# Patient Record
Sex: Male | Born: 1950 | Race: White | Hispanic: No | Marital: Married | Smoking: Current every day smoker
Health system: Southern US, Community
[De-identification: ages and names within clinical notes are randomized; demographics above are authoritative.]

## PROBLEM LIST (undated history)

## (undated) DIAGNOSIS — R918 Other nonspecific abnormal finding of lung field: Secondary | ICD-10-CM

## (undated) DIAGNOSIS — Z720 Tobacco use: Secondary | ICD-10-CM

## (undated) DIAGNOSIS — I471 Supraventricular tachycardia, unspecified: Secondary | ICD-10-CM

## (undated) DIAGNOSIS — M51379 Other intervertebral disc degeneration, lumbosacral region without mention of lumbar back pain or lower extremity pain: Secondary | ICD-10-CM

## (undated) DIAGNOSIS — K529 Noninfective gastroenteritis and colitis, unspecified: Secondary | ICD-10-CM

## (undated) DIAGNOSIS — M1990 Unspecified osteoarthritis, unspecified site: Secondary | ICD-10-CM

## (undated) DIAGNOSIS — G4730 Sleep apnea, unspecified: Secondary | ICD-10-CM

## (undated) DIAGNOSIS — D125 Benign neoplasm of sigmoid colon: Secondary | ICD-10-CM

## (undated) DIAGNOSIS — M5126 Other intervertebral disc displacement, lumbar region: Secondary | ICD-10-CM

## (undated) DIAGNOSIS — J449 Chronic obstructive pulmonary disease, unspecified: Secondary | ICD-10-CM

## (undated) DIAGNOSIS — I509 Heart failure, unspecified: Secondary | ICD-10-CM

## (undated) DIAGNOSIS — I2510 Atherosclerotic heart disease of native coronary artery without angina pectoris: Secondary | ICD-10-CM

## (undated) DIAGNOSIS — I7121 Aneurysm of the ascending aorta, without rupture: Secondary | ICD-10-CM

## (undated) DIAGNOSIS — F329 Major depressive disorder, single episode, unspecified: Secondary | ICD-10-CM

## (undated) DIAGNOSIS — F419 Anxiety disorder, unspecified: Secondary | ICD-10-CM

## (undated) DIAGNOSIS — H9193 Unspecified hearing loss, bilateral: Secondary | ICD-10-CM

## (undated) DIAGNOSIS — I712 Thoracic aortic aneurysm, without rupture: Secondary | ICD-10-CM

## (undated) DIAGNOSIS — J189 Pneumonia, unspecified organism: Secondary | ICD-10-CM

## (undated) DIAGNOSIS — T8140XA Infection following a procedure, unspecified, initial encounter: Secondary | ICD-10-CM

## (undated) DIAGNOSIS — R0602 Shortness of breath: Secondary | ICD-10-CM

## (undated) DIAGNOSIS — N400 Enlarged prostate without lower urinary tract symptoms: Secondary | ICD-10-CM

## (undated) DIAGNOSIS — I499 Cardiac arrhythmia, unspecified: Secondary | ICD-10-CM

## (undated) DIAGNOSIS — M5137 Other intervertebral disc degeneration, lumbosacral region: Secondary | ICD-10-CM

## (undated) DIAGNOSIS — T7840XA Allergy, unspecified, initial encounter: Secondary | ICD-10-CM

## (undated) DIAGNOSIS — E785 Hyperlipidemia, unspecified: Secondary | ICD-10-CM

## (undated) DIAGNOSIS — I10 Essential (primary) hypertension: Secondary | ICD-10-CM

## (undated) HISTORY — DX: Hyperlipidemia, unspecified: E78.5

## (undated) HISTORY — PX: COLONOSCOPY: SHX174

## (undated) HISTORY — DX: Tobacco use: Z72.0

## (undated) HISTORY — PX: APPENDECTOMY: SHX54

## (undated) HISTORY — DX: Benign neoplasm of sigmoid colon: D12.5

## (undated) HISTORY — DX: Heart failure, unspecified: I50.9

## (undated) HISTORY — DX: Noninfective gastroenteritis and colitis, unspecified: K52.9

## (undated) HISTORY — DX: Other nonspecific abnormal finding of lung field: R91.8

## (undated) HISTORY — DX: Sleep apnea, unspecified: G47.30

## (undated) HISTORY — DX: Thoracic aortic aneurysm, without rupture: I71.2

## (undated) HISTORY — DX: Aneurysm of the ascending aorta, without rupture: I71.21

## (undated) HISTORY — PX: CHOLECYSTECTOMY: SHX55

## (undated) HISTORY — DX: Atherosclerotic heart disease of native coronary artery without angina pectoris: I25.10

## (undated) HISTORY — DX: Supraventricular tachycardia, unspecified: I47.10

## (undated) HISTORY — DX: Benign prostatic hyperplasia without lower urinary tract symptoms: N40.0

## (undated) HISTORY — DX: Anxiety disorder, unspecified: F41.9

## (undated) HISTORY — DX: Pneumonia, unspecified organism: J18.9

## (undated) HISTORY — DX: Essential (primary) hypertension: I10

## (undated) HISTORY — DX: Allergy, unspecified, initial encounter: T78.40XA

## (undated) HISTORY — PX: KNEE ARTHROSCOPY W/ MENISCAL REPAIR: SHX1877

## (undated) HISTORY — DX: Major depressive disorder, single episode, unspecified: F32.9

## (undated) HISTORY — DX: Supraventricular tachycardia: I47.1

## (undated) HISTORY — DX: Chronic obstructive pulmonary disease, unspecified: J44.9

## (undated) HISTORY — PX: CARPAL TUNNEL RELEASE: SHX101

---

## 1978-08-23 DIAGNOSIS — I10 Essential (primary) hypertension: Secondary | ICD-10-CM

## 1978-08-23 HISTORY — DX: Essential (primary) hypertension: I10

## 1982-08-23 HISTORY — PX: GASTRIC FUNDOPLICATION: SHX226

## 1997-08-23 DIAGNOSIS — E785 Hyperlipidemia, unspecified: Secondary | ICD-10-CM

## 1997-08-23 HISTORY — DX: Hyperlipidemia, unspecified: E78.5

## 1999-08-24 DIAGNOSIS — F329 Major depressive disorder, single episode, unspecified: Secondary | ICD-10-CM

## 1999-08-24 DIAGNOSIS — F411 Generalized anxiety disorder: Secondary | ICD-10-CM | POA: Insufficient documentation

## 1999-08-24 DIAGNOSIS — F3289 Other specified depressive episodes: Secondary | ICD-10-CM | POA: Insufficient documentation

## 1999-08-24 DIAGNOSIS — F32A Depression, unspecified: Secondary | ICD-10-CM

## 1999-08-24 DIAGNOSIS — F419 Anxiety disorder, unspecified: Secondary | ICD-10-CM

## 1999-08-24 HISTORY — DX: Depression, unspecified: F32.A

## 1999-08-24 HISTORY — DX: Anxiety disorder, unspecified: F41.9

## 2000-08-23 DIAGNOSIS — T7840XA Allergy, unspecified, initial encounter: Secondary | ICD-10-CM

## 2000-08-23 HISTORY — DX: Allergy, unspecified, initial encounter: T78.40XA

## 2001-08-04 ENCOUNTER — Ambulatory Visit (HOSPITAL_COMMUNITY): Admission: RE | Admit: 2001-08-04 | Discharge: 2001-08-04 | Payer: Self-pay | Admitting: Family Medicine

## 2001-08-04 ENCOUNTER — Encounter: Payer: Self-pay | Admitting: Family Medicine

## 2002-11-23 ENCOUNTER — Emergency Department (HOSPITAL_COMMUNITY): Admission: EM | Admit: 2002-11-23 | Discharge: 2002-11-23 | Payer: Self-pay | Admitting: Emergency Medicine

## 2003-01-14 ENCOUNTER — Ambulatory Visit (HOSPITAL_COMMUNITY): Admission: RE | Admit: 2003-01-14 | Discharge: 2003-01-14 | Payer: Self-pay | Admitting: Cardiology

## 2003-01-14 HISTORY — PX: CARDIAC CATHETERIZATION: SHX172

## 2003-02-21 HISTORY — PX: ANKLE FRACTURE SURGERY: SHX122

## 2003-08-24 ENCOUNTER — Encounter: Payer: Self-pay | Admitting: Family Medicine

## 2003-08-24 DIAGNOSIS — E119 Type 2 diabetes mellitus without complications: Secondary | ICD-10-CM | POA: Insufficient documentation

## 2004-01-22 ENCOUNTER — Encounter: Payer: Self-pay | Admitting: Family Medicine

## 2004-01-22 LAB — CONVERTED CEMR LAB: Hgb A1c MFr Bld: 5.2 %

## 2004-03-11 ENCOUNTER — Encounter: Payer: Self-pay | Admitting: Pulmonary Disease

## 2004-03-15 ENCOUNTER — Ambulatory Visit (HOSPITAL_BASED_OUTPATIENT_CLINIC_OR_DEPARTMENT_OTHER): Admission: RE | Admit: 2004-03-15 | Discharge: 2004-03-15 | Payer: Self-pay | Admitting: Pulmonary Disease

## 2004-03-23 HISTORY — PX: OTHER SURGICAL HISTORY: SHX169

## 2004-04-29 ENCOUNTER — Emergency Department (HOSPITAL_COMMUNITY): Admission: EM | Admit: 2004-04-29 | Discharge: 2004-04-29 | Payer: Self-pay | Admitting: Emergency Medicine

## 2004-05-03 ENCOUNTER — Ambulatory Visit (HOSPITAL_BASED_OUTPATIENT_CLINIC_OR_DEPARTMENT_OTHER): Admission: RE | Admit: 2004-05-03 | Discharge: 2004-05-03 | Payer: Self-pay | Admitting: Pulmonary Disease

## 2004-05-03 ENCOUNTER — Encounter: Payer: Self-pay | Admitting: Pulmonary Disease

## 2004-05-23 ENCOUNTER — Encounter: Payer: Self-pay | Admitting: Family Medicine

## 2004-05-23 LAB — CONVERTED CEMR LAB: Hgb A1c MFr Bld: 5.8 %

## 2004-06-03 ENCOUNTER — Ambulatory Visit: Admission: RE | Admit: 2004-06-03 | Discharge: 2004-06-03 | Payer: Self-pay | Admitting: Family Medicine

## 2004-06-09 ENCOUNTER — Ambulatory Visit (HOSPITAL_COMMUNITY): Admission: RE | Admit: 2004-06-09 | Discharge: 2004-06-09 | Payer: Self-pay | Admitting: Family Medicine

## 2004-07-24 ENCOUNTER — Ambulatory Visit: Payer: Self-pay | Admitting: Family Medicine

## 2004-08-11 ENCOUNTER — Ambulatory Visit: Payer: Self-pay

## 2004-08-13 ENCOUNTER — Ambulatory Visit: Payer: Self-pay

## 2004-08-19 ENCOUNTER — Ambulatory Visit: Payer: Self-pay | Admitting: Internal Medicine

## 2004-08-23 DIAGNOSIS — T8140XA Infection following a procedure, unspecified, initial encounter: Secondary | ICD-10-CM

## 2004-08-23 HISTORY — DX: Infection following a procedure, unspecified, initial encounter: T81.40XA

## 2004-10-01 ENCOUNTER — Ambulatory Visit: Payer: Self-pay | Admitting: Family Medicine

## 2004-10-18 ENCOUNTER — Emergency Department (HOSPITAL_COMMUNITY): Admission: EM | Admit: 2004-10-18 | Discharge: 2004-10-19 | Payer: Self-pay | Admitting: Emergency Medicine

## 2004-10-18 DIAGNOSIS — K529 Noninfective gastroenteritis and colitis, unspecified: Secondary | ICD-10-CM

## 2004-10-18 HISTORY — DX: Noninfective gastroenteritis and colitis, unspecified: K52.9

## 2004-10-25 ENCOUNTER — Emergency Department (HOSPITAL_COMMUNITY): Admission: EM | Admit: 2004-10-25 | Discharge: 2004-10-25 | Payer: Self-pay | Admitting: Emergency Medicine

## 2004-10-28 ENCOUNTER — Ambulatory Visit: Payer: Self-pay | Admitting: Family Medicine

## 2004-11-24 HISTORY — PX: GASTRIC BYPASS: SHX52

## 2005-01-22 ENCOUNTER — Ambulatory Visit: Payer: Self-pay | Admitting: Family Medicine

## 2005-01-27 ENCOUNTER — Ambulatory Visit: Payer: Self-pay | Admitting: Pulmonary Disease

## 2005-03-10 ENCOUNTER — Ambulatory Visit: Payer: Self-pay | Admitting: Family Medicine

## 2005-03-12 ENCOUNTER — Ambulatory Visit: Payer: Self-pay | Admitting: Family Medicine

## 2005-03-14 ENCOUNTER — Emergency Department: Payer: Self-pay | Admitting: Emergency Medicine

## 2005-03-15 ENCOUNTER — Ambulatory Visit: Payer: Self-pay | Admitting: Family Medicine

## 2005-03-19 ENCOUNTER — Ambulatory Visit: Payer: Self-pay | Admitting: Family Medicine

## 2005-03-23 ENCOUNTER — Encounter: Payer: Self-pay | Admitting: Family Medicine

## 2005-03-24 ENCOUNTER — Ambulatory Visit: Payer: Self-pay | Admitting: Family Medicine

## 2005-04-19 ENCOUNTER — Ambulatory Visit: Payer: Self-pay | Admitting: Cardiology

## 2005-04-22 ENCOUNTER — Ambulatory Visit: Payer: Self-pay | Admitting: Family Medicine

## 2005-04-23 ENCOUNTER — Ambulatory Visit: Payer: Self-pay | Admitting: Family Medicine

## 2005-08-26 ENCOUNTER — Ambulatory Visit: Payer: Self-pay | Admitting: Family Medicine

## 2005-09-23 ENCOUNTER — Encounter: Payer: Self-pay | Admitting: Family Medicine

## 2005-09-23 LAB — CONVERTED CEMR LAB
Hgb A1c MFr Bld: 5 %
Microalbumin U total vol: 30 mg/L
PSA: 0.44 ng/mL

## 2005-10-06 ENCOUNTER — Ambulatory Visit: Payer: Self-pay | Admitting: Family Medicine

## 2005-10-21 ENCOUNTER — Ambulatory Visit: Payer: Self-pay | Admitting: Family Medicine

## 2005-10-25 ENCOUNTER — Ambulatory Visit: Payer: Self-pay | Admitting: Family Medicine

## 2006-01-02 HISTORY — PX: HERNIA REPAIR: SHX51

## 2006-01-24 ENCOUNTER — Ambulatory Visit: Payer: Self-pay | Admitting: Family Medicine

## 2006-03-15 ENCOUNTER — Encounter: Payer: Self-pay | Admitting: Pulmonary Disease

## 2006-04-22 ENCOUNTER — Ambulatory Visit: Payer: Self-pay | Admitting: Family Medicine

## 2006-04-26 ENCOUNTER — Ambulatory Visit: Payer: Self-pay | Admitting: Family Medicine

## 2006-05-10 ENCOUNTER — Ambulatory Visit: Payer: Self-pay | Admitting: Family Medicine

## 2006-05-17 ENCOUNTER — Ambulatory Visit: Payer: Self-pay | Admitting: Family Medicine

## 2006-05-24 ENCOUNTER — Ambulatory Visit: Payer: Self-pay | Admitting: Family Medicine

## 2006-05-31 ENCOUNTER — Ambulatory Visit: Payer: Self-pay | Admitting: Family Medicine

## 2006-06-07 ENCOUNTER — Ambulatory Visit: Payer: Self-pay | Admitting: Family Medicine

## 2006-06-20 ENCOUNTER — Ambulatory Visit: Payer: Self-pay | Admitting: Pulmonary Disease

## 2006-06-23 ENCOUNTER — Ambulatory Visit: Payer: Self-pay | Admitting: Family Medicine

## 2006-07-25 ENCOUNTER — Ambulatory Visit: Payer: Self-pay | Admitting: Family Medicine

## 2006-09-22 ENCOUNTER — Ambulatory Visit: Payer: Self-pay | Admitting: Pulmonary Disease

## 2006-10-22 ENCOUNTER — Encounter: Payer: Self-pay | Admitting: Family Medicine

## 2006-10-27 ENCOUNTER — Ambulatory Visit: Payer: Self-pay | Admitting: Family Medicine

## 2006-10-27 LAB — CONVERTED CEMR LAB
Alkaline Phosphatase: 135 units/L — ABNORMAL HIGH (ref 39–117)
BUN: 9 mg/dL (ref 6–23)
Basophils Relative: 0.1 % (ref 0.0–1.0)
Bilirubin, Direct: 0.3 mg/dL (ref 0.0–0.3)
CO2: 32 meq/L (ref 19–32)
Cholesterol: 154 mg/dL (ref 0–200)
GFR calc Af Amer: 101 mL/min
Glucose, Bld: 103 mg/dL — ABNORMAL HIGH (ref 70–99)
HDL: 38.6 mg/dL — ABNORMAL LOW (ref >39.0)
Hemoglobin: 16.5 g/dL (ref 13.0–17.0)
Lymphocytes Relative: 21.5 % (ref 12.0–46.0)
MCHC: 34.4 g/dL (ref 30.0–36.0)
Microalb Creat Ratio: 2.9 mg/g (ref 0.0–30.0)
Microalb, Ur: 0.5 mg/dL (ref 0.0–1.9)
Monocytes Absolute: 0.9 10*3/uL — ABNORMAL HIGH (ref 0.2–0.7)
Monocytes Relative: 7.1 % (ref 3.0–11.0)
Neutro Abs: 8.8 10*3/uL — ABNORMAL HIGH (ref 1.4–7.7)
Potassium: 4.2 meq/L (ref 3.5–5.1)
Total Protein: 6.1 g/dL (ref 6.0–8.3)
VLDL: 30 mg/dL (ref 0–40)

## 2006-11-09 ENCOUNTER — Ambulatory Visit: Payer: Self-pay | Admitting: Family Medicine

## 2006-12-09 ENCOUNTER — Encounter: Payer: Self-pay | Admitting: Family Medicine

## 2006-12-09 DIAGNOSIS — I10 Essential (primary) hypertension: Secondary | ICD-10-CM

## 2006-12-09 DIAGNOSIS — I471 Supraventricular tachycardia: Secondary | ICD-10-CM

## 2006-12-09 DIAGNOSIS — J309 Allergic rhinitis, unspecified: Secondary | ICD-10-CM | POA: Insufficient documentation

## 2006-12-09 DIAGNOSIS — G4730 Sleep apnea, unspecified: Secondary | ICD-10-CM | POA: Insufficient documentation

## 2006-12-09 DIAGNOSIS — G4700 Insomnia, unspecified: Secondary | ICD-10-CM

## 2006-12-09 DIAGNOSIS — F17200 Nicotine dependence, unspecified, uncomplicated: Secondary | ICD-10-CM | POA: Insufficient documentation

## 2006-12-28 ENCOUNTER — Ambulatory Visit: Payer: Self-pay | Admitting: Pulmonary Disease

## 2007-04-03 ENCOUNTER — Ambulatory Visit: Payer: Self-pay | Admitting: Pulmonary Disease

## 2007-05-10 ENCOUNTER — Encounter (INDEPENDENT_AMBULATORY_CARE_PROVIDER_SITE_OTHER): Payer: Self-pay | Admitting: *Deleted

## 2007-05-12 ENCOUNTER — Ambulatory Visit: Payer: Self-pay | Admitting: Family Medicine

## 2007-05-31 LAB — CONVERTED CEMR LAB
CO2: 29 meq/L (ref 19–32)
GFR calc Af Amer: 90 mL/min
Glucose, Bld: 100 mg/dL — ABNORMAL HIGH (ref 70–99)
Potassium: 3.7 meq/L (ref 3.5–5.1)

## 2007-06-07 ENCOUNTER — Ambulatory Visit: Payer: Self-pay | Admitting: Family Medicine

## 2007-06-07 DIAGNOSIS — N3941 Urge incontinence: Secondary | ICD-10-CM | POA: Insufficient documentation

## 2007-06-09 ENCOUNTER — Telehealth: Payer: Self-pay | Admitting: Family Medicine

## 2007-06-12 ENCOUNTER — Ambulatory Visit: Payer: Self-pay | Admitting: Family Medicine

## 2007-06-27 ENCOUNTER — Telehealth: Payer: Self-pay | Admitting: Family Medicine

## 2007-06-29 ENCOUNTER — Ambulatory Visit: Payer: Self-pay | Admitting: Family Medicine

## 2007-07-03 ENCOUNTER — Ambulatory Visit: Payer: Self-pay | Admitting: Unknown Physician Specialty

## 2007-07-03 ENCOUNTER — Encounter: Payer: Self-pay | Admitting: Family Medicine

## 2007-07-03 DIAGNOSIS — K635 Polyp of colon: Secondary | ICD-10-CM

## 2007-07-03 HISTORY — DX: Polyp of colon: K63.5

## 2007-07-03 LAB — HM COLONOSCOPY

## 2007-07-18 ENCOUNTER — Ambulatory Visit: Payer: Self-pay | Admitting: Family Medicine

## 2007-08-28 ENCOUNTER — Telehealth: Payer: Self-pay | Admitting: Family Medicine

## 2007-11-10 ENCOUNTER — Ambulatory Visit: Payer: Self-pay | Admitting: Family Medicine

## 2007-11-30 ENCOUNTER — Telehealth: Payer: Self-pay | Admitting: Family Medicine

## 2007-12-22 ENCOUNTER — Telehealth: Payer: Self-pay | Admitting: Family Medicine

## 2008-02-19 ENCOUNTER — Telehealth: Payer: Self-pay | Admitting: Family Medicine

## 2008-02-22 ENCOUNTER — Telehealth: Payer: Self-pay | Admitting: Family Medicine

## 2008-03-05 ENCOUNTER — Ambulatory Visit: Payer: Self-pay | Admitting: Pulmonary Disease

## 2008-03-05 DIAGNOSIS — G2581 Restless legs syndrome: Secondary | ICD-10-CM | POA: Insufficient documentation

## 2008-04-01 ENCOUNTER — Telehealth: Payer: Self-pay | Admitting: Family Medicine

## 2008-04-07 ENCOUNTER — Encounter: Payer: Self-pay | Admitting: Pulmonary Disease

## 2008-04-30 ENCOUNTER — Ambulatory Visit: Payer: Self-pay | Admitting: Family Medicine

## 2008-04-30 LAB — CONVERTED CEMR LAB
Bilirubin Urine: NEGATIVE
Nitrite: NEGATIVE
Specific Gravity, Urine: 1.02

## 2008-05-01 ENCOUNTER — Encounter: Payer: Self-pay | Admitting: Family Medicine

## 2008-05-07 ENCOUNTER — Ambulatory Visit: Payer: Self-pay | Admitting: Family Medicine

## 2008-05-07 DIAGNOSIS — N400 Enlarged prostate without lower urinary tract symptoms: Secondary | ICD-10-CM | POA: Insufficient documentation

## 2008-05-07 DIAGNOSIS — H9190 Unspecified hearing loss, unspecified ear: Secondary | ICD-10-CM | POA: Insufficient documentation

## 2008-05-15 ENCOUNTER — Telehealth: Payer: Self-pay | Admitting: Family Medicine

## 2008-06-10 ENCOUNTER — Ambulatory Visit: Payer: Self-pay | Admitting: Family Medicine

## 2008-07-03 ENCOUNTER — Telehealth: Payer: Self-pay | Admitting: Family Medicine

## 2008-07-22 ENCOUNTER — Telehealth: Payer: Self-pay | Admitting: Family Medicine

## 2008-07-24 ENCOUNTER — Ambulatory Visit: Payer: Self-pay | Admitting: Family Medicine

## 2008-07-24 DIAGNOSIS — L723 Sebaceous cyst: Secondary | ICD-10-CM | POA: Insufficient documentation

## 2008-07-29 ENCOUNTER — Ambulatory Visit: Payer: Self-pay | Admitting: Family Medicine

## 2008-08-08 ENCOUNTER — Ambulatory Visit: Payer: Self-pay | Admitting: Family Medicine

## 2008-08-08 DIAGNOSIS — K648 Other hemorrhoids: Secondary | ICD-10-CM | POA: Insufficient documentation

## 2008-08-12 ENCOUNTER — Telehealth: Payer: Self-pay | Admitting: Family Medicine

## 2008-08-23 HISTORY — PX: BACK SURGERY: SHX140

## 2008-12-05 ENCOUNTER — Telehealth: Payer: Self-pay | Admitting: Family Medicine

## 2009-01-02 ENCOUNTER — Telehealth: Payer: Self-pay | Admitting: Family Medicine

## 2009-03-25 ENCOUNTER — Ambulatory Visit: Payer: Self-pay | Admitting: Family Medicine

## 2009-03-25 LAB — CONVERTED CEMR LAB
ALT: 60 units/L — ABNORMAL HIGH (ref 0–53)
AST: 44 units/L — ABNORMAL HIGH (ref 0–37)
Alkaline Phosphatase: 116 units/L (ref 39–117)
BUN: 15 mg/dL (ref 6–23)
Basophils Absolute: 0.1 10*3/uL (ref 0.0–0.1)
Bilirubin, Direct: 0.2 mg/dL (ref 0.0–0.3)
Calcium: 8.5 mg/dL (ref 8.4–10.5)
Cholesterol: 128 mg/dL (ref 0–200)
Creatinine,U: 228.8 mg/dL
Eosinophils Relative: 2.8 % (ref 0.0–5.0)
GFR calc non Af Amer: 82.16 mL/min (ref >60)
HCT: 44.1 % (ref 39.0–52.0)
HDL: 35.1 mg/dL — ABNORMAL LOW (ref >39.00)
LDL Cholesterol: 75 mg/dL (ref 0–99)
Lymphocytes Relative: 34.4 % (ref 12.0–46.0)
Lymphs Abs: 3.1 10*3/uL (ref 0.7–4.0)
Microalb Creat Ratio: 2.6 mg/g (ref 0.0–30.0)
Monocytes Relative: 7.3 % (ref 3.0–12.0)
Neutrophils Relative %: 54.7 % (ref 43.0–77.0)
PSA: 0.22 ng/mL (ref 0.10–4.00)
Platelets: 190 10*3/uL (ref 150.0–400.0)
Potassium: 3.9 meq/L (ref 3.5–5.1)
Sodium: 143 meq/L (ref 135–145)
Total Bilirubin: 1.9 mg/dL — ABNORMAL HIGH (ref 0.3–1.2)
VLDL: 18.2 mg/dL (ref 0.0–40.0)
WBC: 8.9 10*3/uL (ref 4.5–10.5)

## 2009-03-31 ENCOUNTER — Ambulatory Visit: Payer: Self-pay | Admitting: Family Medicine

## 2009-03-31 DIAGNOSIS — M545 Low back pain: Secondary | ICD-10-CM

## 2009-05-15 ENCOUNTER — Encounter: Payer: Self-pay | Admitting: Family Medicine

## 2009-06-09 ENCOUNTER — Telehealth: Payer: Self-pay | Admitting: Family Medicine

## 2009-06-24 ENCOUNTER — Telehealth: Payer: Self-pay | Admitting: Family Medicine

## 2009-07-23 ENCOUNTER — Telehealth: Payer: Self-pay | Admitting: Family Medicine

## 2009-07-24 ENCOUNTER — Telehealth: Payer: Self-pay | Admitting: Family Medicine

## 2009-09-03 ENCOUNTER — Telehealth: Payer: Self-pay | Admitting: Family Medicine

## 2009-09-10 ENCOUNTER — Telehealth: Payer: Self-pay | Admitting: Family Medicine

## 2009-09-26 ENCOUNTER — Ambulatory Visit: Payer: Self-pay | Admitting: Pulmonary Disease

## 2009-10-02 ENCOUNTER — Ambulatory Visit: Payer: Self-pay | Admitting: Family Medicine

## 2009-10-02 LAB — CONVERTED CEMR LAB
CO2: 22 meq/L (ref 19–32)
Chloride: 108 meq/L (ref 96–112)
Potassium: 3.6 meq/L (ref 3.5–5.1)
Sodium: 137 meq/L (ref 135–145)

## 2009-10-06 ENCOUNTER — Ambulatory Visit: Payer: Self-pay | Admitting: Family Medicine

## 2009-10-06 ENCOUNTER — Telehealth: Payer: Self-pay | Admitting: Family Medicine

## 2009-11-24 ENCOUNTER — Encounter: Payer: Self-pay | Admitting: Pulmonary Disease

## 2009-11-26 ENCOUNTER — Encounter: Payer: Self-pay | Admitting: Family Medicine

## 2009-12-04 ENCOUNTER — Ambulatory Visit: Payer: Self-pay | Admitting: Orthopedic Surgery

## 2009-12-09 ENCOUNTER — Telehealth: Payer: Self-pay | Admitting: Family Medicine

## 2010-01-10 ENCOUNTER — Emergency Department: Payer: Self-pay | Admitting: Emergency Medicine

## 2010-01-22 ENCOUNTER — Telehealth: Payer: Self-pay | Admitting: Family Medicine

## 2010-02-02 ENCOUNTER — Telehealth: Payer: Self-pay | Admitting: Family Medicine

## 2010-02-03 ENCOUNTER — Ambulatory Visit: Payer: Self-pay | Admitting: Family Medicine

## 2010-02-04 ENCOUNTER — Telehealth: Payer: Self-pay | Admitting: Family Medicine

## 2010-02-04 ENCOUNTER — Ambulatory Visit: Payer: Self-pay | Admitting: Family Medicine

## 2010-02-05 LAB — CONVERTED CEMR LAB
Basophils Absolute: 0 10*3/uL (ref 0.0–0.1)
CO2: 25 meq/L (ref 19–32)
GFR calc non Af Amer: 87.97 mL/min (ref >60)
Glucose, Bld: 97 mg/dL (ref 70–99)
HCT: 43.9 % (ref 39.0–52.0)
Hemoglobin: 14.9 g/dL (ref 13.0–17.0)
Lymphs Abs: 2.4 10*3/uL (ref 0.7–4.0)
Monocytes Relative: 5.5 % (ref 3.0–12.0)
Neutro Abs: 8.7 10*3/uL — ABNORMAL HIGH (ref 1.4–7.7)
Potassium: 4.6 meq/L (ref 3.5–5.1)
RDW: 15.3 % — ABNORMAL HIGH (ref 11.5–14.6)
Sodium: 141 meq/L (ref 135–145)

## 2010-02-10 ENCOUNTER — Ambulatory Visit: Payer: Self-pay | Admitting: Family Medicine

## 2010-03-30 ENCOUNTER — Encounter (INDEPENDENT_AMBULATORY_CARE_PROVIDER_SITE_OTHER): Payer: Self-pay | Admitting: *Deleted

## 2010-04-03 ENCOUNTER — Telehealth: Payer: Self-pay | Admitting: Family Medicine

## 2010-05-08 ENCOUNTER — Ambulatory Visit: Payer: Self-pay | Admitting: Neurosurgery

## 2010-05-29 ENCOUNTER — Encounter: Payer: Self-pay | Admitting: Neurosurgery

## 2010-06-03 ENCOUNTER — Telehealth: Payer: Self-pay | Admitting: Family Medicine

## 2010-06-11 ENCOUNTER — Encounter: Payer: Self-pay | Admitting: Family Medicine

## 2010-06-23 ENCOUNTER — Ambulatory Visit: Payer: Self-pay | Admitting: Family Medicine

## 2010-06-23 ENCOUNTER — Encounter: Payer: Self-pay | Admitting: Neurosurgery

## 2010-07-20 ENCOUNTER — Telehealth: Payer: Self-pay | Admitting: Family Medicine

## 2010-07-22 ENCOUNTER — Telehealth: Payer: Self-pay | Admitting: Family Medicine

## 2010-07-23 ENCOUNTER — Encounter: Payer: Self-pay | Admitting: Neurosurgery

## 2010-08-10 ENCOUNTER — Ambulatory Visit: Payer: Self-pay | Admitting: Family Medicine

## 2010-08-13 ENCOUNTER — Ambulatory Visit: Payer: Self-pay | Admitting: Family Medicine

## 2010-08-13 LAB — HM DIABETES FOOT EXAM

## 2010-09-24 NOTE — Progress Notes (Signed)
Summary: RX Prozac  Phone Note Refill Request Call back at [phone] Message from:  [facility] on September 03, 2009 10:52 AM  Refills Requested: Medication #1:  PROZAC 40 MG  CAPS Take 1 capsule by mouth once a day   Last Refilled: 05/23/2009 Received faxed refill request. Request a 90 day supply.   Method Requested: Electronic Initial call taken by: Sydell Axon LPN,  September 03, 2009 10:53 AM    Prescriptions: PROZAC 40 MG  CAPS (FLUOXETINE HCL) Take 1 capsule by mouth once a day  #90 x 3   Entered and Authorized by:   Hannah Beat MD   Signed by:   Hannah Beat MD on 09/03/2009   Method used:   Electronically to        [facility] [address] (retail)       [address]       [place], [place]  [address]       Ph: [phone]       Fax: [phone]   RxID:   [id number]

## 2010-09-24 NOTE — Progress Notes (Signed)
Summary: refill request for flexeril  Phone Note Refill Request Message from:  Patient  Refills Requested: Medication #1:  CYCLOBENZAPRINE HCL 10 MG TABS one tab by mouth three times a day as needed for muscle spasms. Request from pt, uses [facility] in [place], [id number].  This came in on 6/13 but I must have signed off on it instead of sending it to you.  Initial call taken by: Lowella Petties CMA,  February 04, 2010 8:19 AM    Prescriptions: CYCLOBENZAPRINE HCL 10 MG TABS (CYCLOBENZAPRINE HCL) one tab by mouth three times a day as needed for muscle spasms.  #50 x 1   Entered and Authorized by:   Shaune Leeks MD   Signed by:   Shaune Leeks MD on 02/04/2010   Method used:   Electronically to        [facility] [address]* (retail)       [address]       [place], [place]  [address]       Ph: [phone]       Fax: [phone]   RxID:   [id number]

## 2010-09-24 NOTE — Letter (Signed)
Summary: Dr.E.Hines,[facility],Note  Dr.E.Hines,[facility],Note   Imported By: Beau Fanny 11/28/2009 10:18:14  _____________________________________________________________________  External Attachment:    Type:   Image     Comment:   External Document

## 2010-09-24 NOTE — Progress Notes (Signed)
Summary: alprazolam   Phone Note Refill Request Message from:  Fax from Pharmacy on July 22, 2010 1:05 PM  Refills Requested: Medication #1:  ALPRAZOLAM 0.5 MG  TB24 Take 1 tablet by mouth two times a day   Last Refilled: 06/22/2010 Refill request from [facility] pharmacy. [phone].   Initial call taken by: Melody Comas,  July 22, 2010 1:06 PM  Follow-up for Phone Call        Rx called to pharmacy.  Follow-up by: Melody Comas,  July 22, 2010 1:34 PM    Prescriptions: ALPRAZOLAM 0.5 MG  TB24 (ALPRAZOLAM) Take 1 tablet by mouth two times a day  #60 x 5   Entered and Authorized by:   Shaune Leeks MD   Signed by:   Shaune Leeks MD on 07/22/2010   Method used:   Telephoned to ...       [facility] [address] (retail)       [address]       [place], [place]  [address]       Ph: [phone]       Fax: [phone]   RxID:   [id number]

## 2010-09-24 NOTE — Assessment & Plan Note (Signed)
Summary: rov for osa, rls   CC:  Pt is here for a f/u appt.   Pt was last seen July 2009.  Pt states he is wearing his cpap machine every night.  Approx 9 hours per night.  Pt states he got a new mask and needs Dr. Shelle Iron to sign a rx for this for ins to pay.  History of Present Illness: The pt comes in today for f/u of his osa and RLS.  He is maintaining on cpap and also a dopamine agonist for his issues.  He is wearing cpap compliantly, and is having no issues with the mask or pressure.  He feels that he sleeps well, and denies any sleepiness issues during the day.  Medications Prior to Update: 1)  Prozac 40 Mg  Caps (Fluoxetine Hcl) .... Take 1 Capsule By Mouth Once A Day 2)  Simvastatin 20 Mg  Tabs (Simvastatin) .... Take 1 Tablet By Mouth Once A Day 3)  Protonix 40 Mg  Tbec (Pantoprazole Sodium) .... Take 1 Tablet By Mouth Once A Day 4)  Lisinopril 20 Mg  Tabs (Lisinopril) .... Take 1 Tablet By Mouth Once A Day 5)  Multivitamins   Tabs (Multiple Vitamin) .... Take 1 Tablet By Mouth Once A Day 6)  Alprazolam 0.5 Mg  Tb24 (Alprazolam) .... Take 1 Tablet By Mouth Two Times A Day 7)  Ambien 10 Mg  Tabs (Zolpidem Tartrate) .... Take 1 Tablet By Mouth At Bedtime 8)  Calcium  1000mg  .... Daily 9)  Requip 1 Mg  Tabs (Ropinirole Hcl) .... Take 1 Tab By Mouth At Bedtime 10)  Detrol La 4 Mg  Cp24 (Tolterodine Tartrate) .Marland Kitchen.. 1 Tab By Mouth Once Daily 11)  Tussionex Pennkinetic Er 8-10 Mg/71ml  Lqcr (Chlorpheniramine-Hydrocodone) .... One Tsp By Mouth Hs As Needed Cough 12)  Mobic 15 Mg  Tabs (Meloxicam) .Marland Kitchen.. 1 Daily 13)  Klor-Con 10 10 Meq  Tbcr (Potassium Chloride) .Marland Kitchen.. 1 Daily By Mouth 14)  Allegra 180 Mg Tabs (Fexofenadine Hcl) .... One Tab By Mouth Daily 15)  Augmentin 875-125 Mg Tabs (Amoxicillin-Pot Clavulanate) .... One Tab By Mouth Two Times A Day 16)  Cardura 2 Mg Tabs (Doxazosin Mesylate) .... 1/2 Tab By Mouth Once Daily For One Week Then One Tab By Mouth Qd 17)  Proscar 5 Mg Tabs  (Finasteride) .... One Tab By Mouth Once Daily 18)  Anusol-Hc 25 Mg Supp (Hydrocortisone Acetate) .... One Supp Per Rectum Three Times A Day For One Week Then As Needed 19)  Cyclobenzaprine Hcl 10 Mg Tabs (Cyclobenzaprine Hcl) .... One Tab By Mouth Three Times A Day As Needed For Muscle Spasms.  Allergies (verified): 1)  ! Sulfa 2)  ! * Codiene  Review of Systems      See HPI  Vital Signs:  Patient profile:   60 year old male Height:      70.50 inches Weight:      242.13 pounds BMI:     34.37 O2 Sat:      96 % on Room air Temp:     98.4 degrees F oral Pulse rate:   114 / minute BP sitting:   124 / 84  (right arm) Cuff size:   large  Vitals Entered By: Arman Filter LPN (September 26, 2009 9:54 AM)  O2 Flow:  Room air  Physical Exam  General:  ow male in nad Nose:  no skin breakdown or pressure necrosis from the cpap mask Neurologic:  alert and awake,  moves all 4.   Impression & Recommendations:  Problem # 1:  RESTLESS LEGS SYNDROME (ICD-333.94) pt is doing well on requip, with control of his symptoms.  Problem # 2:  SLEEP APNEA (ICD-780.57) the pt is doing well with his cpap, and his current mask is comfortable and not leaking.  He feels that he is sleeping well with the device, and his daytime alertness is adequate.  Other Orders: Est. Patient Level II ([address])  Patient Instructions: 1)  stay on cpap, work on weight loss 2)  followup with me in 12mos.

## 2010-09-24 NOTE — Progress Notes (Signed)
Summary: Rx Mobic  Phone Note Refill Request Call back at [phone] Message from:  [facility] on October 06, 2009 8:49 AM  Refills Requested: Medication #1:  MOBIC 15 MG  TABS 1 DAILY Received faxed refill request, please advise   Method Requested: Electronic Initial call taken by: Linde Gillis CMA Duncan Dull),  October 06, 2009 8:50 AM    Prescriptions: MOBIC 15 MG  TABS (MELOXICAM) 1 DAILY  #30 Tablet x 6   Entered and Authorized by:   Shaune Leeks MD   Signed by:   Shaune Leeks MD on 10/06/2009   Method used:   Electronically to        [facility] [address]* (retail)       [address]       [place], [place]  [address]       Ph: [phone]       Fax: [phone]   RxID:   [id number]

## 2010-09-24 NOTE — Assessment & Plan Note (Signed)
Summary: JUNE FOLLOW UP, CPX/RBH   Vital Signs:  Patient profile:   60 year old male Weight:      231 pounds Temp:     97.5 degrees F oral Pulse rate:   72 / minute Pulse rhythm:   regular BP sitting:   120 / 78  (left arm) Cuff size:   large  Vitals Entered By: Sydell Axon LPN (February 10, 2010 9:52 AM) CC: Follow-up after labs   History of Present Illness: Pt here for Comp Exam...he is getting over his congestion and has lost another 5 pounds. He feels good. His knee pain hasn't changed...he was seen by Dr Kennith Center.  He has no complaints except for small cyst on the back of the neck.  Problems Prior to Update: 1)  Bronchitis- Acute  (ICD-466.0) 2)  Back Pain, Lumbar  (ICD-724.2) 3)  Hemorrhoids, Internal, With Bleeding  (ICD-455.2) 4)  Sebaceous Cyst  (ICD-706.2) 5)  Hypertrophy Prostate W/ur Obst & Oth Luts  (ICD-600.01) 6)  Hearing Loss, Unspec.  (ICD-389.9) 7)  Restless Legs Syndrome  (ICD-333.94) 8)  Urinary Incontinence, Urge  (ICD-788.31) 9)  Insomnia  (ICD-780.52) 10)  Paroxysmal Supraventricular Tachycardia  (ICD-427.0) 11)  Sleep Apnea  (ICD-780.57) 12)  Diabetes Mellitus, Type II  (ICD-250.00) 13)  Anxiety  (ICD-300.00) 14)  Depression  (ICD-311) 15)  Smoker  (ICD-305.1) 16)  Hypertension  (ICD-401.9) 17)  Allergic Rhinitis  (ICD-477.9) 18)  Hyperlipidemia  (ICD-272.4)  Medications Prior to Update: 1)  Prozac 40 Mg  Caps (Fluoxetine Hcl) .... Take 1 Capsule By Mouth Once A Day 2)  Simvastatin 20 Mg  Tabs (Simvastatin) .... Take 1 Tablet By Mouth Once A Day 3)  Protonix 40 Mg  Tbec (Pantoprazole Sodium) .... Take 1 Tablet By Mouth Once A Day 4)  Lisinopril 20 Mg  Tabs (Lisinopril) .... Take 1 Tablet By Mouth Once A Day 5)  Multivitamins   Tabs (Multiple Vitamin) .... Take 1 Tablet By Mouth Once A Day 6)  Alprazolam 0.5 Mg  Tb24 (Alprazolam) .... Take 1 Tablet By Mouth Two Times A Day 7)  Ambien 10 Mg  Tabs (Zolpidem Tartrate) .... Take 1 Tablet By Mouth At  Bedtime 8)  Calcium  1000mg  .... Daily 9)  Requip 1 Mg  Tabs (Ropinirole Hcl) .... Take 1 Tab By Mouth At Bedtime 10)  Detrol La 4 Mg  Cp24 (Tolterodine Tartrate) .Marland Kitchen.. 1 Tab By Mouth Once Daily 11)  Tussionex Pennkinetic Er 8-10 Mg/57ml  Lqcr (Chlorpheniramine-Hydrocodone) .... One Tsp By Mouth Hs As Needed Cough 12)  Mobic 15 Mg  Tabs (Meloxicam) .Marland Kitchen.. 1 Daily 13)  Klor-Con 10 10 Meq  Tbcr (Potassium Chloride) .Marland Kitchen.. 1 Daily By Mouth 14)  Allegra 180 Mg Tabs (Fexofenadine Hcl) .... One Tab By Mouth Daily 15)  Cardura 2 Mg Tabs (Doxazosin Mesylate) .... 1/2 Tab By Mouth Once Daily For One Week Then One Tab By Mouth Qd 16)  Proscar 5 Mg Tabs (Finasteride) .... One Tab By Mouth Once Daily 17)  Anusol-Hc 25 Mg Supp (Hydrocortisone Acetate) .... One Supp Per Rectum Three Times A Day For One Week Then As Needed 18)  Cyclobenzaprine Hcl 10 Mg Tabs (Cyclobenzaprine Hcl) .... One Tab By Mouth Three Times A Day As Needed For Muscle Spasms. 19)  Mucus Relief 400 Mg Tabs (Guaifenesin) .... As Needed 20)  Zithromax Z-Pak 250 Mg Tabs (Azithromycin) .... As Directed  Allergies: 1)  Sulfa 2)  * Codiene  Past History:  Past Medical History: Last  updated: 07/18/2007 Hyperlipidemia (08/23/1997) Allergic rhinitis (2002) Hypertension (1980) Depression (08/24/1999) Anxiety (08/24/1999) Diabetes mellitus, type II (08/24/2003)  Past Surgical History: Last updated: 07/18/2007 Gastric Fundoplication [facility]) 1984 Cath nml ([facility]) 2000 ECHO ([facility]) Mild AAA  Tr MR EF 61% 05/30/2002 Colonoscopy Polyp Sigmoid 05/10/2002    3 yrs Cath  Min plaque EF 65%  01/14/2003 Event Monitor ok 12/2002 Fx R Ankle 02/2003 Colonoscopy nml 04/06/2004 EGD nml ?Gastric Bypass '84  04/06/2004 Sleep Study Hypopneas 60/hr   Kicks 160/hr   03/2004 Cardiolite nml 08/14/2004 CT Abd Enteritis  CT Pelvis Sacroiliac Spurring  10/18/2004 GASTRIC BYPASS 11/24/2004 R Ventral Hernia Repair Laparascopic/Mesh  01/02/2006 Colonoscopy Sm int  Hemms Sigmoid Polyp B9 (Dr Mechele Collin)  07/03/07  Family History: Last updated: 02/10/2010 Father 12-27-60Pneumonia CHF Htn Mother 27-Dec-2058CABG DM Died due Staph Infection of Sternotomy Incision Brother  A 51 ETOH, has stopped at present Brother A 44 ETOH  Social History: Last updated: 12/09/2006 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 Head:  Normocephalic and atraumatic without obvious abnormalities. No apparent alopecia but significant male pattern  balding. Sinuses NT. Eyes:  Conjunctiva clear bilaterally.  Ears:  External ear exam shows no significant lesions or deformities.  Otoscopic examination reveals clear canals, tympanic membranes are intact bilaterally without bulging, retraction, inflammation or discharge. Hearing is grossly normal bilaterally. Nose:  External nasal examination shows no deformity or inflammation. Nasal mucosa are pink and moist without lesions or exudates. Mouth:  Oral mucosa and oropharynx without lesions or exudates.  Teeth in good repair. Neck:  No deformities, masses, or tenderness noted. Chest Wall:  No deformities, masses, tenderness or gynecomastia noted. Breasts:  No masses or gynecomastia noted Lungs:  Normal respiratory effort, chest expands symmetrically. Lungs are clear to auscultation, no crackles or wheezes. Heart:  Normal rate and regular rhythm. S1 and S2  normal without gallop, murmur, click, rub or other extra sounds. Abdomen:  Bowel sounds positive,abdomen soft and non-tender without masses, organomegaly or hernias noted. Rectal:  No external abnormalities noted. Normal sphincter tone. No rectal masses or tenderness. G neg. Genitalia:  Testes bilaterally descended without nodularity, tenderness or masses. No scrotal masses or lesions. No penis lesions or urethral discharge. Prostate:  Prostate gland firm and smooth, no enlargement, nodularity, tenderness, mass, asymmetry or induration. 20-30 gms. Msk:  No deformity or scoliosis noted of thoracic or lumbar spine.  Nontender to direct oalpation but c/o L4/5 lvl discomfort from PSIS to PSIS. DTRs 2+, SLR mildly posistive at 80 degrees bilat with LBP. Pulses:  R and L carotid,radial,femoral,dorsalis pedis and posterior tibial pulses are full and equal bilaterally Extremities:  No clubbing, cyanosis, edema, or deformity noted with normal full range of motion of all joints.   Neurologic:  No cranial nerve deficits noted. Station and gait are normal. Sensory, motor and coordinative functions appear intact. Skin:  Small sebaceous cyst on the back of the neck, nonfluctulant. Cervical Nodes:  No lymphadenopathy noted Inguinal Nodes:  No significant adenopathy Psych:  Cognition and  judgment appear intact. Alert and cooperative with normal attention span and concentration. No apparent delusions, illusions, hallucinations   Impression & Recommendations:  Problem # 1:  BRONCHITIS- ACUTE (ICD-466.0) Assessment Improved  Congestion clearing...finish meds. His updated medication list for this problem includes:    Tussionex Pennkinetic Er 8-10 Mg/1ml Lqcr (Chlorpheniramine-hydrocodone) ..... One tsp by mouth hs as needed cough    Mucus Relief 400 Mg Tabs (Guaifenesin) .Marland Kitchen... As needed    Zithromax Z-pak 250 Mg Tabs (Azithromycin) .Marland Kitchen... As directed  Take antibiotics and other medications as directed.  Encouraged to push clear liquids, get enough rest, and take acetaminophen as needed. To be seen in 5-7 days if no improvement, sooner if worse.  Problem # 2:  BACK PAIN, LUMBAR (ICD-724.2) Assessment: Unchanged Chronic. Discussed approach. Nsaids as little as possible. His updated medication list for this problem includes:    Mobic 15 Mg Tabs (Meloxicam) .Marland Kitchen... 1 daily    Cyclobenzaprine Hcl 10 Mg Tabs (Cyclobenzaprine hcl) ..... One tab by mouth three times a day as needed for muscle spasms.  Problem # 3:  HEMORRHOIDS, INTERNAL, WITH BLEEDING (ICD-455.2) Assessment: Unchanged Stable.  Problem # 4:  SEBACEOUS CYST (ICD-706.2) Assessment: Unchanged New one on back of neck. Apply heat toattempt to resolve.  Problem # 5:  HYPERTROPHY PROSTATE W/UR OBST & OTH LUTS (ICD-600.01) Assessment: Unchanged Stable exam.  Problem # 6:  DIABETES MELLITUS, TYPE II (ICD-250.00) Assessment: Unchanged Stable with great nos. Weight loss has made great difference. Cont efforts. His updated medication list for this problem includes:    Lisinopril 20 Mg Tabs (Lisinopril) .Marland Kitchen... Take 1 tablet by mouth once a day  Labs Reviewed: Creat: 0.9 (02/04/2010)   Microalbumin: 30 (09/23/2005) Reviewed HgBA1c results: 5.7 (02/04/2010)  5.6 (10/02/2009)  Problem # 7:  ANXIETY (ICD-300.00) Assessment: Unchanged Stable. Worried about his wife who has had back surgery. His updated medication list for this problem includes:    Prozac 40 Mg Caps (Fluoxetine hcl) .Marland Kitchen... Take 1 capsule by mouth once a day    Alprazolam 0.5 Mg Tb24 (Alprazolam) .Marland Kitchen... Take 1 tablet by mouth two times a day  Problem # 8:  HYPERTENSION (ICD-401.9) Assessment: Unchanged Stable. His updated medication list for this problem includes:    Lisinopril 20 Mg Tabs (Lisinopril) .Marland Kitchen... Take 1 tablet by mouth once a day    Cardura 2 Mg Tabs (Doxazosin mesylate) .Marland Kitchen... Take one by mouth daily  BP today: 120/78 Prior BP: 130/68 (02/03/2010)  Labs  Reviewed: K+: 4.6 (02/04/2010) Creat: : 0.9 (02/04/2010)   Chol: 128 (03/25/2009)   HDL: 35.10 (03/25/2009)   LDL: 75 (03/25/2009)   TG: 91.0 (03/25/2009)  Problem # 9:  HYPERLIPIDEMIA (ICD-272.4) Assessment: Unchanged Will check again in the future. His updated medication list for this problem includes:    Simvastatin 20 Mg Tabs (Simvastatin) .Marland Kitchen... Take 1 tablet by mouth once a day  Labs Reviewed: SGOT: 44 (03/25/2009)   SGPT: 60 (03/25/2009)   HDL:35.10 (03/25/2009), 38.6 (10/27/2006)  LDL:75 (03/25/2009), 85 (10/27/2006)  Chol:128 (03/25/2009), 154 (10/27/2006)  Trig:91.0 (03/25/2009), 152 (10/27/2006)  Complete Medication List: 1)  Prozac 40 Mg Caps (Fluoxetine hcl) .... Take 1 capsule by mouth once a day 2)  Simvastatin 20 Mg Tabs (Simvastatin) .... Take 1 tablet by mouth once a day 3)  Protonix 40 Mg Tbec (Pantoprazole sodium) .... Take 1 tablet by mouth once a day 4)  Lisinopril 20 Mg Tabs (Lisinopril) .... Take 1 tablet by mouth once a day 5)  Multivitamins Tabs (  Multiple vitamin) .... Take 1 tablet by mouth once a day 6)  Alprazolam 0.5 Mg Tb24 (Alprazolam) .... Take 1 tablet by mouth two times a day 7)  Ambien 10 Mg Tabs (Zolpidem tartrate) .... Take 1 tablet by mouth at bedtime 8)  Requip 1 Mg Tabs (Ropinirole hcl) .... Take 1 tab by mouth at bedtime 9)  Detrol La 4 Mg Cp24 (Tolterodine tartrate) .Marland Kitchen.. 1 tab by mouth once daily 10)  Tussionex Pennkinetic Er 8-10 Mg/44ml Lqcr (Chlorpheniramine-hydrocodone) .... One tsp by mouth hs as needed cough 11)  Mobic 15 Mg Tabs (Meloxicam) .Marland Kitchen.. 1 daily 12)  Klor-con 10 10 Meq Tbcr (Potassium chloride) .Marland Kitchen.. 1 daily by mouth 13)  Allegra 180 Mg Tabs (Fexofenadine hcl) .... One tab by mouth daily 14)  Cardura 2 Mg Tabs (Doxazosin mesylate) .... Take one by mouth daily 15)  Proscar 5 Mg Tabs (Finasteride) .... One tab by mouth once daily 16)  Anusol-hc 25 Mg Supp (Hydrocortisone acetate) .... One supp per rectum three times a day for one week  then as needed 17)  Cyclobenzaprine Hcl 10 Mg Tabs (Cyclobenzaprine hcl) .... One tab by mouth three times a day as needed for muscle spasms. 18)  Mucus Relief 400 Mg Tabs (Guaifenesin) .... As needed 19)  Zithromax Z-pak 250 Mg Tabs (Azithromycin) .... As directed 20)  Calcium Antacid Ultra Max St 1000 Mg Chew (Calcium carbonate antacid) .... One daily  Patient Instructions: 1)  Pls get eye exam. 2)  RTC 6 mos for Comp Exam, A1C prior 250.00  Current Allergies (reviewed today): SULFA * CODIENE

## 2010-09-24 NOTE — Progress Notes (Signed)
Summary: refill request for flexeril  Phone Note Refill Request Message from:  Fax from Pharmacy  Refills Requested: Medication #1:  CYCLOBENZAPRINE HCL 10 MG TABS one tab by mouth three times a day as needed for muscle spasms..   Last Refilled: 12/19/2009 Faxed request from [facility], [phone].  Initial call taken by: Lowella Petties CMA,  February 02, 2010 9:23 AM

## 2010-09-24 NOTE — Assessment & Plan Note (Signed)
Summary: Mark Morris   Vital Signs:  Patient profile:   60 year old male Height:      70.50 inches Weight:      237.25 pounds BMI:     33.68 Temp:     98.2 degrees F oral Pulse rate:   84 / minute Pulse rhythm:   regular BP sitting:   142 / 90  (left arm) Cuff size:   large  Vitals Entered By: Delilah Shan CMA Duncan Dull) (June 23, 2010 9:28 AM) CC: ? Mark cyst   History of Present Illness: "Boil" in gluteal cleft.  No FCNAV.  1st episode.  Draining already and decrease in size.  Mild uri symptoms o/w with nasal congestion.   Allergies: 1)  Sulfa 2)  * Codiene  Review of Systems       See HPI.  Otherwise negative.    Physical Exam  General:  GEN: nad, alert and oriented HEENT: mucous membranes moist, TM w/o erythema, nasal epithelium injected, OP with minimal cobblestoning NECK: supple w/o LA CV: rrr. PULM: ctab, no inc wob ABD: soft, +bs EXT: no edema  gluteal crease with draining pilonidal cyst on L of midline.  serosang fluid with occ purulent component.  minimal erythema,  slightly tender to palpation    Impression & Recommendations:  Problem # 1:  PILONIDAL CYST (ICD-685.1) D/w patient.  Continue with soak and start the antibiotics.  follow up as needed.  If it returns, we can drain again and refer to [facility].  No need to I&D now.  benign viral symptoms with uri that should gradually resolve.  supporitve tx in meantime. He understood all of the above.   Complete Medication List: 1)  Prozac 40 Mg Caps (Fluoxetine hcl) .... Take 1 capsule by mouth once a day 2)  Simvastatin 20 Mg Tabs (Simvastatin) .... Take 1 tablet by mouth once a day 3)  Protonix 40 Mg Tbec (Pantoprazole sodium) .... Take 1 tablet by mouth once a day 4)  Lisinopril 20 Mg Tabs (Lisinopril) .... Take 1 tablet by mouth once a day 5)  Multivitamins Tabs (Multiple vitamin) .... Take 1 tablet by mouth once a day 6)  Alprazolam 0.5 Mg Tb24 (Alprazolam) .... Take 1 tablet by mouth two times a  day 7)  Ambien 10 Mg Tabs (Zolpidem tartrate) .... Take 1 tablet by mouth at bedtime 8)  Requip 1 Mg Tabs (Ropinirole hcl) .... Take 1 tab by mouth at bedtime 9)  Detrol La 4 Mg Cp24 (Tolterodine tartrate) .Marland Kitchen.. 1 tab by mouth once daily 10)  Mobic 15 Mg Tabs (Meloxicam) .Marland Kitchen.. 1 daily 11)  Klor-con 10 10 Meq Tbcr (Potassium chloride) .Marland Kitchen.. 1 daily by mouth 12)  Allegra 180 Mg Tabs (Fexofenadine hcl) .... One tab by mouth daily 13)  Cardura 2 Mg Tabs (Doxazosin mesylate) .... Take one by mouth daily 14)  Proscar 5 Mg Tabs (Finasteride) .... One tab by mouth once daily 15)  Anusol-hc 25 Mg Supp (Hydrocortisone acetate) .... One supp per rectum three times a day for one week then as needed 16)  Cyclobenzaprine Hcl 10 Mg Tabs (Cyclobenzaprine hcl) .... One tab by mouth three times a day as needed for muscle spasms. 17)  Mucus Relief 400 Mg Tabs (Guaifenesin) .... As needed 18)  Calcium Antacid Ultra Max St 1000 Mg Chew (Calcium carbonate antacid) .... One daily 19)  Doxycycline Hyclate 100 Mg Caps (Doxycycline hyclate) .Marland Kitchen.. 1 by mouth two times a day  Patient Instructions: 1)  Take  the antibiotics as directed. 2)  I would get in the bath and soak a few times a day.   3)  Let me know if you have fevers or if the cyst is getting bigger.  It should keep draining a few more days.   Prescriptions: DOXYCYCLINE HYCLATE 100 MG CAPS (DOXYCYCLINE HYCLATE) 1 by mouth two times a day  #20 x 0   Entered and Authorized by:   Crawford Givens MD   Signed by:   Crawford Givens MD on 06/23/2010   Method used:   Electronically to        [facility] [address] [address] (retail)       [address]       [place], [place]  [address]       Ph: [phone]       Fax: [phone]   RxID:   [id number]    Orders Added: 1)  Est. Patient Level III [66440]     Orders Added: 1)  Est. Patient Level III [34742]   Current Allergies (reviewed today): SULFA * CODIENE

## 2010-09-24 NOTE — Assessment & Plan Note (Signed)
Summary: F/U AFTER LABS / LFW   Vital Signs:  Patient profile:   60 year old male Height:      70.50 inches Weight:      236.25 pounds BMI:     33.54 Temp:     97.5 degrees F oral Pulse rate:   72 / minute Pulse rhythm:   regular BP sitting:   122 / 86  (right arm) Cuff size:   regular  Vitals Entered By: Linde Gillis CMA Duncan Dull) (October 06, 2009 8:56 AM) CC: follow up    History of Present Illness: Pt here for 6 mo followup. He is watching his meds and intake...has lost 10 pounds and his sugar is normal. He is exercising some. He is sleeping ok, legs are ok on meds. CPAP works well.  he has no complaints except his knees and back.  Problems Prior to Update: 1)  Back Pain, Lumbar  (ICD-724.2) 2)  Hemorrhoids, Internal, With Bleeding  (ICD-455.2) 3)  Sebaceous Cyst  (ICD-706.2) 4)  Hypertrophy Prostate W/ur Obst & Oth Luts  (ICD-600.01) 5)  Hearing Loss, Unspec.  (ICD-389.9) 6)  Restless Legs Syndrome  (ICD-333.94) 7)  Urinary Incontinence, Urge  (ICD-788.31) 8)  Insomnia  (ICD-780.52) 9)  Paroxysmal Supraventricular Tachycardia  (ICD-427.0) 10)  Sleep Apnea  (ICD-780.57) 11)  Diabetes Mellitus, Type II  (ICD-250.00) 12)  Anxiety  (ICD-300.00) 13)  Depression  (ICD-311) 14)  Smoker  (ICD-305.1) 15)  Hypertension  (ICD-401.9) 16)  Allergic Rhinitis  (ICD-477.9) 17)  Hyperlipidemia  (ICD-272.4)  Medications Prior to Update: 1)  Prozac 40 Mg  Caps (Fluoxetine Hcl) .... Take 1 Capsule By Mouth Once A Day 2)  Simvastatin 20 Mg  Tabs (Simvastatin) .... Take 1 Tablet By Mouth Once A Day 3)  Protonix 40 Mg  Tbec (Pantoprazole Sodium) .... Take 1 Tablet By Mouth Once A Day 4)  Lisinopril 20 Mg  Tabs (Lisinopril) .... Take 1 Tablet By Mouth Once A Day 5)  Multivitamins   Tabs (Multiple Vitamin) .... Take 1 Tablet By Mouth Once A Day 6)  Alprazolam 0.5 Mg  Tb24 (Alprazolam) .... Take 1 Tablet By Mouth Two Times A Day 7)  Ambien 10 Mg  Tabs (Zolpidem Tartrate) .... Take 1  Tablet By Mouth At Bedtime 8)  Calcium  1000mg  .... Daily 9)  Requip 1 Mg  Tabs (Ropinirole Hcl) .... Take 1 Tab By Mouth At Bedtime 10)  Detrol La 4 Mg  Cp24 (Tolterodine Tartrate) .Marland Kitchen.. 1 Tab By Mouth Once Daily 11)  Tussionex Pennkinetic Er 8-10 Mg/66ml  Lqcr (Chlorpheniramine-Hydrocodone) .... One Tsp By Mouth Hs As Needed Cough 12)  Mobic 15 Mg  Tabs (Meloxicam) .Marland Kitchen.. 1 Daily 13)  Klor-Con 10 10 Meq  Tbcr (Potassium Chloride) .Marland Kitchen.. 1 Daily By Mouth 14)  Allegra 180 Mg Tabs (Fexofenadine Hcl) .... One Tab By Mouth Daily 15)  Augmentin 875-125 Mg Tabs (Amoxicillin-Pot Clavulanate) .... One Tab By Mouth Two Times A Day 16)  Cardura 2 Mg Tabs (Doxazosin Mesylate) .... 1/2 Tab By Mouth Once Daily For One Week Then One Tab By Mouth Qd 17)  Proscar 5 Mg Tabs (Finasteride) .... One Tab By Mouth Once Daily 18)  Anusol-Hc 25 Mg Supp (Hydrocortisone Acetate) .... One Supp Per Rectum Three Times A Day For One Week Then As Needed 19)  Cyclobenzaprine Hcl 10 Mg Tabs (Cyclobenzaprine Hcl) .... One Tab By Mouth Three Times A Day As Needed For Muscle Spasms.  Allergies: 1)  Sulfa 2)  *  Codiene  Physical Exam  General:  Well-developed,well-nourished,in no acute distress; alert,appropriate and cooperative throughout examination, getting thinner!. Head:  Normocephalic and atraumatic without obvious abnormalities. No apparent alopecia but significant male pattern  balding. Eyes:  Conjunctiva clear bilaterally.  Ears:  External ear exam shows no significant lesions or deformities.  Otoscopic examination reveals clear canals, tympanic membranes are intact bilaterally without bulging, retraction, inflammation or discharge. Hearing is grossly normal bilaterally. Nose:  External nasal examination shows no deformity or inflammation. Nasal mucosa are pink and moist without lesions or exudates. Mouth:  Oral mucosa and oropharynx without lesions or exudates.  Teeth in good repair. Neck:  No deformities, masses, or  tenderness noted. Chest Wall:  No deformities, masses, tenderness or gynecomastia noted. Lungs:  Normal respiratory effort, chest expands symmetrically. Lungs are clear to auscultation, no crackles or wheezes. Heart:  Normal rate and regular rhythm. S1 and S2 normal without gallop, murmur, click, rub or other extra sounds.   Impression & Recommendations:  Problem # 1:  BACK PAIN, LUMBAR (ICD-724.2) Assessment Unchanged Continues but bearable. His updated medication list for this problem includes:    Mobic 15 Mg Tabs (Meloxicam) .Marland Kitchen... 1 daily    Cyclobenzaprine Hcl 10 Mg Tabs (Cyclobenzaprine hcl) ..... One tab by mouth three times a day as needed for muscle spasms.  Problem # 2:  RESTLESS LEGS SYNDROME (ICD-333.94) Assessment: Improved Cont Requip.  Problem # 3:  DIABETES MELLITUS, TYPE II (ICD-250.00) Assessment: Improved  Excellent control....amazing what weight loss will do. His updated medication list for this problem includes:    Lisinopril 20 Mg Tabs (Lisinopril) .Marland Kitchen... Take 1 tablet by mouth once a day  Labs Reviewed: Creat: 0.9 (10/02/2009)   Microalbumin: 30 (09/23/2005) Reviewed HgBA1c results: 5.6 (10/02/2009)  5.2 (03/25/2009)  Problem # 4:  DEPRESSION (ICD-311) Wel controlled. Cont meds. His updated medication list for this problem includes:    Prozac 40 Mg Caps (Fluoxetine hcl) .Marland Kitchen... Take 1 capsule by mouth once a day    Alprazolam 0.5 Mg Tb24 (Alprazolam) .Marland Kitchen... Take 1 tablet by mouth two times a day  Problem # 5:  HYPERTENSION (ICD-401.9) Assessment: Unchanged  His updated medication list for this problem includes:    Lisinopril 20 Mg Tabs (Lisinopril) .Marland Kitchen... Take 1 tablet by mouth once a day    Cardura 2 Mg Tabs (Doxazosin mesylate) .Marland Kitchen... 1/2 tab by mouth once daily for one week then one tab by mouth qd  BP today: 122/86 Prior BP: 124/84 (09/26/2009)  Labs Reviewed: K+: 3.6 (10/02/2009) Creat: : 0.9 (10/02/2009)   Chol: 128 (03/25/2009)   HDL: 35.10  (03/25/2009)   LDL: 75 (03/25/2009)   TG: 91.0 (03/25/2009)  Complete Medication List: 1)  Prozac 40 Mg Caps (Fluoxetine hcl) .... Take 1 capsule by mouth once a day 2)  Simvastatin 20 Mg Tabs (Simvastatin) .... Take 1 tablet by mouth once a day 3)  Protonix 40 Mg Tbec (Pantoprazole sodium) .... Take 1 tablet by mouth once a day 4)  Lisinopril 20 Mg Tabs (Lisinopril) .... Take 1 tablet by mouth once a day 5)  Multivitamins Tabs (Multiple vitamin) .... Take 1 tablet by mouth once a day 6)  Alprazolam 0.5 Mg Tb24 (Alprazolam) .... Take 1 tablet by mouth two times a day 7)  Ambien 10 Mg Tabs (Zolpidem tartrate) .... Take 1 tablet by mouth at bedtime 8)  Calcium 1000mg   .... Daily 9)  Requip 1 Mg Tabs (Ropinirole hcl) .... Take 1 tab by mouth at bedtime 10)  Detrol La 4 Mg Cp24 (Tolterodine tartrate) .Marland Kitchen.. 1 tab by mouth once daily 11)  Tussionex Pennkinetic Er 8-10 Mg/35ml Lqcr (Chlorpheniramine-hydrocodone) .... One tsp by mouth hs as needed cough 12)  Mobic 15 Mg Tabs (Meloxicam) .Marland Kitchen.. 1 daily 13)  Klor-con 10 10 Meq Tbcr (Potassium chloride) .Marland Kitchen.. 1 daily by mouth 14)  Allegra 180 Mg Tabs (Fexofenadine hcl) .... One tab by mouth daily 15)  Augmentin 875-125 Mg Tabs (Amoxicillin-pot clavulanate) .... One tab by mouth two times a day 16)  Cardura 2 Mg Tabs (Doxazosin mesylate) .... 1/2 tab by mouth once daily for one week then one tab by mouth qd 17)  Proscar 5 Mg Tabs (Finasteride) .... One tab by mouth once daily 18)  Anusol-hc 25 Mg Supp (Hydrocortisone acetate) .... One supp per rectum three times a day for one week then as needed 19)  Cyclobenzaprine Hcl 10 Mg Tabs (Cyclobenzaprine hcl) .... One tab by mouth three times a day as needed for muscle spasms.  Patient Instructions: 1)  Call in June for Comp Exam.  Current Allergies (reviewed today): SULFA * CODIENE  Appended Document: F/U AFTER LABS / LFW Flu Vaccine Consent Questions     Do you have a history of severe allergic  reactions to this vaccine? no    Any prior history of allergic reactions to egg and/or gelatin? no    Do you have a sensitivity to the preservative Thimersol? no    Do you have a past history of Guillan-Barre Syndrome? no    Do you currently have an acute febrile illness? no    Have you ever had a severe reaction to latex? no    Vaccine information given and explained to patient? yes    Are you currently pregnant? no    Lot Number:[id number]   Exp Date:02/19/2010   Site Given  Right Deltoid IM

## 2010-09-24 NOTE — Assessment & Plan Note (Signed)
Summary: CONGESTION/CLE   Vital Signs:  Patient profile:   60 year old male Weight:      231.75 pounds O2 Sat:      96 % on Room air Temp:     98.0 degrees F oral Pulse rate:   60 / minute Pulse rhythm:   regular BP sitting:   130 / 68  (left arm) Cuff size:   large  Vitals Entered By: Sydell Axon LPN (February 03, 2010 2:21 PM)  O2 Flow:  Room air CC: Head and chest congestion, productive cough, runny nose, symptoms X 2-3 weeks   History of Present Illness: Pt here for feeling poorly over last 2-3 weeks. He has been using guaifenesin. He is having copious rhinitis but has signoificant chest congestion. He has not had fever or chills. He has had some sore throat, no ear pain, clear nasal discharge, cough is productive clear. No N/V, some SOB. 3-4 weeks ago he was on riding lawnmower and it came over on him onto concrete. He had bradycardia at the time. He had a hard time getting out from under the mower. He was given NSAIDS and Oxycodone and was very sore for 4-5 days. He was told he had spurs and arthritis and had narrowing.   Problems Prior to Update: 1)  Back Pain, Lumbar  (ICD-724.2) 2)  Hemorrhoids, Internal, With Bleeding  (ICD-455.2) 3)  Sebaceous Cyst  (ICD-706.2) 4)  Hypertrophy Prostate W/ur Obst & Oth Luts  (ICD-600.01) 5)  Hearing Loss, Unspec.  (ICD-389.9) 6)  Restless Legs Syndrome  (ICD-333.94) 7)  Urinary Incontinence, Urge  (ICD-788.31) 8)  Insomnia  (ICD-780.52) 9)  Paroxysmal Supraventricular Tachycardia  (ICD-427.0) 10)  Sleep Apnea  (ICD-780.57) 11)  Diabetes Mellitus, Type II  (ICD-250.00) 12)  Anxiety  (ICD-300.00) 13)  Depression  (ICD-311) 14)  Smoker  (ICD-305.1) 15)  Hypertension  (ICD-401.9) 16)  Allergic Rhinitis  (ICD-477.9) 17)  Hyperlipidemia  (ICD-272.4)  Medications Prior to Update: 1)  Prozac 40 Mg  Caps (Fluoxetine Hcl) .... Take 1 Capsule By Mouth Once A Day 2)  Simvastatin 20 Mg  Tabs (Simvastatin) .... Take 1 Tablet By Mouth Once A  Day 3)  Protonix 40 Mg  Tbec (Pantoprazole Sodium) .... Take 1 Tablet By Mouth Once A Day 4)  Lisinopril 20 Mg  Tabs (Lisinopril) .... Take 1 Tablet By Mouth Once A Day 5)  Multivitamins   Tabs (Multiple Vitamin) .... Take 1 Tablet By Mouth Once A Day 6)  Alprazolam 0.5 Mg  Tb24 (Alprazolam) .... Take 1 Tablet By Mouth Two Times A Day 7)  Ambien 10 Mg  Tabs (Zolpidem Tartrate) .... Take 1 Tablet By Mouth At Bedtime 8)  Calcium  1000mg  .... Daily 9)  Requip 1 Mg  Tabs (Ropinirole Hcl) .... Take 1 Tab By Mouth At Bedtime 10)  Detrol La 4 Mg  Cp24 (Tolterodine Tartrate) .Marland Kitchen.. 1 Tab By Mouth Once Daily 11)  Tussionex Pennkinetic Er 8-10 Mg/72ml  Lqcr (Chlorpheniramine-Hydrocodone) .... One Tsp By Mouth Hs As Needed Cough 12)  Mobic 15 Mg  Tabs (Meloxicam) .Marland Kitchen.. 1 Daily 13)  Klor-Con 10 10 Meq  Tbcr (Potassium Chloride) .Marland Kitchen.. 1 Daily By Mouth 14)  Allegra 180 Mg Tabs (Fexofenadine Hcl) .... One Tab By Mouth Daily 15)  Augmentin 875-125 Mg Tabs (Amoxicillin-Pot Clavulanate) .... One Tab By Mouth Two Times A Day 16)  Cardura 2 Mg Tabs (Doxazosin Mesylate) .... 1/2 Tab By Mouth Once Daily For One Week Then One Tab By Mouth  Qd 17)  Proscar 5 Mg Tabs (Finasteride) .... One Tab By Mouth Once Daily 18)  Anusol-Hc 25 Mg Supp (Hydrocortisone Acetate) .... One Supp Per Rectum Three Times A Day For One Week Then As Needed 19)  Cyclobenzaprine Hcl 10 Mg Tabs (Cyclobenzaprine Hcl) .... One Tab By Mouth Three Times A Day As Needed For Muscle Spasms.  Allergies: 1)  Sulfa 2)  * Codiene  Physical Exam  General:  Well-developed,well-nourished,in no acute distress; alert,appropriate and cooperative throughout examination, still getting thinner!, congested.. Head:  Normocephalic and atraumatic without obvious abnormalities. No apparent alopecia but significant male pattern  balding. Sinuses NT. Eyes:  Conjunctiva clear bilaterally.  Ears:  External ear exam shows no significant lesions or deformities.  Otoscopic  examination reveals clear canals, tympanic membranes are intact bilaterally without bulging, retraction, inflammation or discharge. Hearing is grossly normal bilaterally. Nose:  External nasal examination shows no deformity or inflammation. Nasal mucosa are pink and moist without lesions or exudates. Mouth:  Oral mucosa and oropharynx without lesions or exudates.  Teeth in good repair. Neck:  No deformities, masses, or tenderness noted. Chest Wall:  No deformities, masses, tenderness or gynecomastia noted. Lungs:  Normal respiratory effort, chest expands symmetrically. Lungs are clear to auscultation, no crackles or wheezes. Heart:  Normal rate and regular rhythm. S1 and S2 normal without gallop, murmur, click, rub or other extra sounds.   Impression & Recommendations:  Problem # 1:  BRONCHITIS- ACUTE (ICD-466.0) Assessment New  Add Zithro and cont Guaif.  The following medications were removed from the medication list:    Augmentin 875-125 Mg Tabs (Amoxicillin-pot clavulanate) ..... One tab by mouth two times a day His updated medication list for this problem includes:    Tussionex Pennkinetic Er 8-10 Mg/33ml Lqcr (Chlorpheniramine-hydrocodone) ..... One tsp by mouth hs as needed cough    Mucus Relief 400 Mg Tabs (Guaifenesin) .Marland Kitchen... As needed    Zithromax Z-pak 250 Mg Tabs (Azithromycin) .Marland Kitchen... As directed  Take antibiotics and other medications as directed. Encouraged to push clear liquids, get enough rest, and take acetaminophen as needed. To be seen in 5-7 days if no improvement, sooner if worse.  Complete Medication List: 1)  Prozac 40 Mg Caps (Fluoxetine hcl) .... Take 1 capsule by mouth once a day 2)  Simvastatin 20 Mg Tabs (Simvastatin) .... Take 1 tablet by mouth once a day 3)  Protonix 40 Mg Tbec (Pantoprazole sodium) .... Take 1 tablet by mouth once a day 4)  Lisinopril 20 Mg Tabs (Lisinopril) .... Take 1 tablet by mouth once a day 5)  Multivitamins Tabs (Multiple vitamin) ....  Take 1 tablet by mouth once a day 6)  Alprazolam 0.5 Mg Tb24 (Alprazolam) .... Take 1 tablet by mouth two times a day 7)  Ambien 10 Mg Tabs (Zolpidem tartrate) .... Take 1 tablet by mouth at bedtime 8)  Calcium 1000mg   .... Daily 9)  Requip 1 Mg Tabs (Ropinirole hcl) .... Take 1 tab by mouth at bedtime 10)  Detrol La 4 Mg Cp24 (Tolterodine tartrate) .Marland Kitchen.. 1 tab by mouth once daily 11)  Tussionex Pennkinetic Er 8-10 Mg/64ml Lqcr (Chlorpheniramine-hydrocodone) .... One tsp by mouth hs as needed cough 12)  Mobic 15 Mg Tabs (Meloxicam) .Marland Kitchen.. 1 daily 13)  Klor-con 10 10 Meq Tbcr (Potassium chloride) .Marland Kitchen.. 1 daily by mouth 14)  Allegra 180 Mg Tabs (Fexofenadine hcl) .... One tab by mouth daily 15)  Cardura 2 Mg Tabs (Doxazosin mesylate) .... 1/2 tab by mouth once daily  for one week then one tab by mouth qd 16)  Proscar 5 Mg Tabs (Finasteride) .... One tab by mouth once daily 17)  Anusol-hc 25 Mg Supp (Hydrocortisone acetate) .... One supp per rectum three times a day for one week then as needed 18)  Cyclobenzaprine Hcl 10 Mg Tabs (Cyclobenzaprine hcl) .... One tab by mouth three times a day as needed for muscle spasms. 19)  Mucus Relief 400 Mg Tabs (Guaifenesin) .... As needed 20)  Zithromax Z-pak 250 Mg Tabs (Azithromycin) .... As directed  Patient Instructions: 1)  Keep appt for labs and recheck next week. Prescriptions: ZITHROMAX Z-PAK 250 MG TABS (AZITHROMYCIN) as directed  #1 pak x 0   Entered and Authorized by:   Shaune Leeks MD   Signed by:   Shaune Leeks MD on 02/03/2010   Method used:   Electronically to        [facility] [address] [address]* (retail)       [address]       [place], [place]  [address]       Ph: [phone]       Fax: [phone]   RxID:   [id number]   Current Allergies (reviewed today): SULFA * CODIENE

## 2010-09-24 NOTE — Letter (Signed)
Summary: Nadara Eaton letter  [place] [facility]  [address] [place], [place] [address]   Phone: [phone]  Fax: [phone]       03/30/2010 MRN: [id number]  JAYDAN MEIDINGER [address] [place], [place]  [address]  Dear Mr. LEETH,  [facility] and [facility] announce the retirement of Arta Silence, M.D., from full-time practice at the [facility] office effective February 19, 2010 and his plans of returning part-time.  It is important to Dr. Hetty Ely and to our practice that you understand that [facility] has seven physicians in our office for your health care needs.  We will continue to offer the same exceptional care that you have today.    Dr. Hetty Ely has spoken to many of you about his plans for retirement and returning part-time in the fall.   We will continue to work with you through the transition to schedule appointments for you in the office and meet the high standards that [facility] is committed to.   Again, it is with great pleasure that we share the news that Dr. Hetty Ely will return to [facility] at [facility] in October of 2011 with a reduced schedule.    If you have any questions, or would like to request an appointment with one of our physicians, please call us at [phone] and press the option for Scheduling an appointment.  We take pleasure in providing you with excellent patient care and look forward to seeing you at your next office visit.  Our [facility] Physicians are:  Tillman Abide, M.D. Laurita Quint, M.D. Roxy Manns, M.D. Kerby Nora, M.D. Hannah Beat, M.D. Ruthe Mannan, M.D. We proudly welcomed Raechel Ache, M.D. and Eustaquio Boyden, M.D. to the practice in July/August 2011.  Sincerely,  [facility]

## 2010-09-24 NOTE — Progress Notes (Signed)
Summary: refill request for alprazolam  Phone Note Refill Request Message from:  Fax from Pharmacy  Refills Requested: Medication #1:  ALPRAZOLAM 0.5 MG  TB24 Take 1 tablet by mouth two times a day   Last Refilled: 12/26/2009 Faxed request from [facility], [phone].  Initial call taken by: Lowella Petties CMA,  January 22, 2010 9:36 AM  Follow-up for Phone Call        Called to [facility] Follow-up by: Lowella Petties CMA,  January 22, 2010 2:22 PM    Prescriptions: ALPRAZOLAM 0.5 MG  TB24 (ALPRAZOLAM) Take 1 tablet by mouth two times a day  #60 x 5   Entered and Authorized by:   Shaune Leeks MD   Signed by:   Shaune Leeks MD on 01/22/2010   Method used:   Telephoned to ...       [facility] [address] [address]* (retail)       [address]       [place], [place]  [address]       Ph: [phone]       Fax: [phone]   RxID:   [id number]

## 2010-09-24 NOTE — Progress Notes (Signed)
Summary: Ambien  Phone Note Refill Request Message from:  Fax from Pharmacy on December 09, 2009 10:47 AM  Refills Requested: Medication #1:  AMBIEN 10 MG  TABS Take 1 tablet by mouth at bedtime [facility]  Phone:   [phone]   Method Requested: Telephone to Pharmacy Initial call taken by: Delilah Shan CMA Duncan Dull),  December 09, 2009 10:48 AM  Follow-up for Phone Call        Rx called to pharmacy Follow-up by: Sydell Axon LPN,  December 09, 2009 3:17 PM    Prescriptions: AMBIEN 10 MG  TABS (ZOLPIDEM TARTRATE) Take 1 tablet by mouth at bedtime  #30 x 5   Entered and Authorized by:   Shaune Leeks MD   Signed by:   Shaune Leeks MD on 12/09/2009   Method used:   Telephoned to ...       [facility] [address] [address]* (retail)       [address]       [place], [place]  [address]       Ph: [phone]       Fax: [phone]   RxID:   [id number]

## 2010-09-24 NOTE — Progress Notes (Signed)
Summary: refill request for mobic  Phone Note Refill Request Message from:  Fax from Pharmacy  Refills Requested: Medication #1:  MOBIC 15 MG  TABS 1 DAILY   Last Refilled: 01/03/2010 [facility]axed request from [facility].  Initial call taken by: Lowella Petties CMA,  April 03, 2010 12:47 PM  Follow-up for Phone Call       Follow-up by: Crawford Givens MD,  April 03, 2010 2:44 PM    Prescriptions: MOBIC 15 MG  TABS (MELOXICAM) 1 DAILY  #30 Tablet x 2   Entered and Authorized by:   Crawford Givens MD   Signed by:   Crawford Givens MD on 04/03/2010   Method used:   Electronically to        [facility] [address] [address]* (retail)       [address]       [place], [place]  [address]       Ph: [phone]       Fax: [phone]   RxID:   [id number]

## 2010-09-24 NOTE — Miscellaneous (Signed)
Summary: Flu vaccine   Clinical Lists Changes  Observations: Added new observation of FLU VAX: Historical (06/07/2010 10:18)      Influenza Immunization History:    Influenza # 1:  Historical (06/07/2010) Received a form from [facility], [place].

## 2010-09-24 NOTE — Assessment & Plan Note (Signed)
Summary: CPX/CLE   Vital Signs:  Patient profile:   60 year old male Weight:      227.75 pounds Temp:     97.1 degrees F oral Pulse rate:   88 / minute Pulse rhythm:   regular BP sitting:   132 / 76  (left arm) Cuff size:   large  Vitals Entered By: Sydell Axon LPN (August 13, 2010 9:18 AM) CC: 30 Minute checkup, had a colonoscopy 11/08   History of Present Illness: Pt here for Comp Exam but had one in Jun. He feels well except for arthritic problems... he has a ruptured disc and a bulging disc pushing on nerves in his back, carpal tunnel in each hand, and his wife has shingles in the eye...seeing [facility], Dr Fransico Michael. He also had a cyst in the gluteal cleft that has resolved. He had labs done in 6/11 and BMet,  CBC, A1C all were ok except WBC elevated.  He other than his joints is doing ok. He is exercising tregularly and has lost some weight. He is laying off fried foods.   Problems Prior to Update: 1)  Pilonidal Cyst  (ICD-685.1) 2)  Back Pain, Lumbar  (ICD-724.2) 3)  Hemorrhoids, Internal, With Bleeding  (ICD-455.2) 4)  Sebaceous Cyst  (ICD-706.2) 5)  Hypertrophy Prostate W/ur Obst & Oth Luts  (ICD-600.01) 6)  Hearing Loss, Unspec.  (ICD-389.9) 7)  Restless Legs Syndrome  (ICD-333.94) 8)  Urinary Incontinence, Urge  (ICD-788.31) 9)  Insomnia  (ICD-780.52) 10)  Paroxysmal Supraventricular Tachycardia  (ICD-427.0) 11)  Sleep Apnea  (ICD-780.57) 12)  Diabetes Mellitus, Type II  (ICD-250.00) 13)  Anxiety  (ICD-300.00) 14)  Depression  (ICD-311) 15)  Smoker  (ICD-305.1) 16)  Hypertension  (ICD-401.9) 17)  Allergic Rhinitis  (ICD-477.9) 18)  Hyperlipidemia  (ICD-272.4)  Medications Prior to Update: 1)  Prozac 40 Mg  Caps (Fluoxetine Hcl) .... Take 1 Capsule By Mouth Once A Day 2)  Simvastatin 20 Mg  Tabs (Simvastatin) .... Take 1 Tablet By Mouth Once A Day 3)  Protonix 40 Mg  Tbec (Pantoprazole Sodium) .... Take 1 Tablet By Mouth Once A Day 4)  Lisinopril 20 Mg  Tabs  (Lisinopril) .... Take 1 Tablet By Mouth Once A Day 5)  Multivitamins   Tabs (Multiple Vitamin) .... Take 1 Tablet By Mouth Once A Day 6)  Alprazolam 0.5 Mg  Tb24 (Alprazolam) .... Take 1 Tablet By Mouth Two Times A Day 7)  Ambien 10 Mg  Tabs (Zolpidem Tartrate) .... Take 1 Tablet By Mouth At Bedtime 8)  Requip 1 Mg  Tabs (Ropinirole Hcl) .... Take 1 Tab By Mouth At Bedtime 9)  Detrol La 4 Mg  Cp24 (Tolterodine Tartrate) .Marland Kitchen.. 1 Tab By Mouth Once Daily 10)  Mobic 15 Mg  Tabs (Meloxicam) .Marland Kitchen.. 1 Daily 11)  Klor-Con 10 10 Meq  Tbcr (Potassium Chloride) .Marland Kitchen.. 1 Daily By Mouth 12)  Allegra 180 Mg Tabs (Fexofenadine Hcl) .... One Tab By Mouth Daily 13)  Cardura 2 Mg Tabs (Doxazosin Mesylate) .... Take One By Mouth Daily 14)  Proscar 5 Mg Tabs (Finasteride) .... One Tab By Mouth Once Daily 15)  Anusol-Hc 25 Mg Supp (Hydrocortisone Acetate) .... One Supp Per Rectum Three Times A Day For One Week Then As Needed 16)  Cyclobenzaprine Hcl 10 Mg Tabs (Cyclobenzaprine Hcl) .... One Tab By Mouth Three Times A Day As Needed For Muscle Spasms. 17)  Mucus Relief 400 Mg Tabs (Guaifenesin) .... As Needed 18)  Calcium Antacid  Ultra Max St 1000 Mg Chew (Calcium Carbonate Antacid) .... One Daily 19)  Doxycycline Hyclate 100 Mg Caps (Doxycycline Hyclate) .Marland Kitchen.. 1 By Mouth Two Times A Day  Current Medications (verified): 1)  Prozac 40 Mg  Caps (Fluoxetine Hcl) .... Take 1 Capsule By Mouth Once A Day 2)  Simvastatin 20 Mg  Tabs (Simvastatin) .... Take 1 Tablet By Mouth Once A Day 3)  Protonix 40 Mg  Tbec (Pantoprazole Sodium) .... Take 1 Tablet By Mouth Once A Day 4)  Lisinopril 20 Mg  Tabs (Lisinopril) .... Take 1 Tablet By Mouth Once A Day 5)  Multivitamins   Tabs (Multiple Vitamin) .... Take 1 Tablet By Mouth Once A Day 6)  Alprazolam 0.5 Mg  Tb24 (Alprazolam) .... Take 1 Tablet By Mouth Two Times A Day 7)  Ambien 10 Mg  Tabs (Zolpidem Tartrate) .... Take 1 Tablet By Mouth At Bedtime 8)  Requip 1 Mg  Tabs (Ropinirole  Hcl) .... Take 1 Tab By Mouth At Bedtime 9)  Detrol La 4 Mg  Cp24 (Tolterodine Tartrate) .Marland Kitchen.. 1 Tab By Mouth Once Daily 10)  Mobic 15 Mg  Tabs (Meloxicam) .Marland Kitchen.. 1 Daily 11)  Klor-Con 10 10 Meq  Tbcr (Potassium Chloride) .Marland Kitchen.. 1 Daily By Mouth 12)  Allegra 180 Mg Tabs (Fexofenadine Hcl) .... One Tab By Mouth Daily 13)  Cardura 2 Mg Tabs (Doxazosin Mesylate) .... Take One By Mouth Daily 14)  Proscar 5 Mg Tabs (Finasteride) .... One Tab By Mouth Once Daily 15)  Anusol-Hc 25 Mg Supp (Hydrocortisone Acetate) .... One Supp Per Rectum Three Times A Day For One Week Then As Needed 16)  Cyclobenzaprine Hcl 10 Mg Tabs (Cyclobenzaprine Hcl) .... One Tab By Mouth Three Times A Day As Needed For Muscle Spasms. 17)  Mucus Relief 400 Mg Tabs (Guaifenesin) .... As Needed 18)  Calcium Antacid Ultra Max St 1000 Mg Chew (Calcium Carbonate Antacid) .... One Daily 19)  Doxycycline Hyclate 100 Mg Caps (Doxycycline Hyclate) .Marland Kitchen.. 1 By Mouth Two Times A Day  Allergies: 1)  Sulfa 2)  * Codiene  Physical Exam  General:  Well-developed,well-nourished,in no acute distress; alert,appropriate and cooperative throughout examination Head:  Normocephalic and atraumatic without obvious abnormalities. No apparent alopecia but significant male pattern  balding. Sinuses NT. Eyes:  Conjunctiva clear bilaterally.  Ears:  External ear exam shows no significant lesions or deformities.  Otoscopic examination reveals clear canals, tympanic membranes are intact bilaterally without bulging, retraction, inflammation or discharge. Hearing is grossly normal bilaterally. Nose:  External nasal examination shows no deformity or inflammation. Nasal mucosa are pink and moist without lesions or exudates. Mouth:  Oral mucosa and oropharynx without lesions or exudates.  Teeth in good repair. Neck:  No deformities, masses, or tenderness noted. Lungs:  Normal respiratory effort, chest expands symmetrically. Lungs are clear to auscultation, no  crackles or wheezes. Heart:  Normal rate and regular rhythm. S1 and S2 normal without gallop, murmur, click, rub or other extra sounds. Abdomen:  Bowel sounds positive,abdomen soft and non-tender without masses, organomegaly or hernias noted.  Diabetes Management Exam:    Foot Exam (with socks and/or shoes not present):       Sensory-Pinprick/Light touch:          Left medial foot (L-4): normal          Left dorsal foot (L-5): normal          Left lateral foot (S-1): normal          Right  medial foot (L-4): normal          Right dorsal foot (L-5): normal          Right lateral foot (S-1): normal       Sensory-Monofilament:          Left foot: normal          Right foot: normal       Inspection:          Left foot: normal          Right foot: normal       Nails:          Left foot: normal          Right foot: normal   Impression & Recommendations:  Problem # 1:  PILONIDAL CYST (ICD-685.1) Assessment Improved Resolved.  Problem # 2:  BACK PAIN, LUMBAR (ICD-724.2) Assessment: Unchanged Continues. Seeing Specialist...has had medication, injections, PT all without much effect. His updated medication list for this problem includes:    Mobic 15 Mg Tabs (Meloxicam) .Marland Kitchen... 1 daily    Cyclobenzaprine Hcl 10 Mg Tabs (Cyclobenzaprine hcl) ..... One tab by mouth three times a day as needed for muscle spasms.  Problem # 3:  DIABETES MELLITUS, TYPE II (ICD-250.00) Assessment: Improved Great job. Control continues.  His updated medication list for this problem includes:    Lisinopril 20 Mg Tabs (Lisinopril) .Marland Kitchen... Take 1 tablet by mouth once a day  Labs Reviewed: Creat: 0.9 (02/04/2010)   Microalbumin: 30 (09/23/2005) Reviewed HgBA1c results: 5.6 (08/10/2010)  5.7 (02/04/2010)  Problem # 4:  HYPERTENSION (ICD-401.9) Assessment: Improved Better but having  pain from infected cyst last time. His updated medication list for this problem includes:    Lisinopril 20 Mg Tabs (Lisinopril)  .Marland Kitchen... Take 1 tablet by mouth once a day    Cardura 2 Mg Tabs (Doxazosin mesylate) .Marland Kitchen... Take one by mouth daily  BP today: 132/76 Prior BP: 142/90 (06/23/2010)  Labs Reviewed: K+: 4.6 (02/04/2010) Creat: : 0.9 (02/04/2010)   Chol: 128 (03/25/2009)   HDL: 35.10 (03/25/2009)   LDL: 75 (03/25/2009)   TG: 91.0 (03/25/2009)  Complete Medication List: 1)  Prozac 40 Mg Caps (Fluoxetine hcl) .... Take 1 capsule by mouth once a day 2)  Simvastatin 20 Mg Tabs (Simvastatin) .... Take 1 tablet by mouth once a day 3)  Protonix 40 Mg Tbec (Pantoprazole sodium) .... Take 1 tablet by mouth once a day 4)  Lisinopril 20 Mg Tabs (Lisinopril) .... Take 1 tablet by mouth once a day 5)  Multivitamins Tabs (Multiple vitamin) .... Take 1 tablet by mouth once a day 6)  Alprazolam 0.5 Mg Tb24 (Alprazolam) .... Take 1 tablet by mouth two times a day 7)  Ambien 10 Mg Tabs (Zolpidem tartrate) .... Take 1 tablet by mouth at bedtime 8)  Requip 1 Mg Tabs (Ropinirole hcl) .... Take 1 tab by mouth at bedtime 9)  Detrol La 4 Mg Cp24 (Tolterodine tartrate) .Marland Kitchen.. 1 tab by mouth once daily 10)  Mobic 15 Mg Tabs (Meloxicam) .Marland Kitchen.. 1 daily 11)  Klor-con 10 10 Meq Tbcr (Potassium chloride) .Marland Kitchen.. 1 daily by mouth 12)  Allegra 180 Mg Tabs (Fexofenadine hcl) .... One tab by mouth daily 13)  Cardura 2 Mg Tabs (Doxazosin mesylate) .... Take one by mouth daily 14)  Proscar 5 Mg Tabs (Finasteride) .... One tab by mouth once daily 15)  Anusol-hc 25 Mg Supp (Hydrocortisone acetate) .... One supp per rectum three times a day for one  week then as needed 16)  Cyclobenzaprine Hcl 10 Mg Tabs (Cyclobenzaprine hcl) .... One tab by mouth three times a day as needed for muscle spasms. 17)  Mucus Relief 400 Mg Tabs (Guaifenesin) .... As needed 18)  Calcium Antacid Ultra Max St 1000 Mg Chew (Calcium carbonate antacid) .... One daily 19)  Doxycycline Hyclate 100 Mg Caps (Doxycycline hyclate) .Marland Kitchen.. 1 by mouth two times a day  Patient Instructions: 1)   RTC 6/12 for Comp Exam, labs prior. 2)  RTC sooner as needed. 3)  Needs eye exam and needs to quit smoking. 4)  Had Pneumonia immun 2002 but hasn't had any probs with pneumonia since that...will hold off updating.

## 2010-09-24 NOTE — Progress Notes (Signed)
Summary: refill request for ambien  Phone Note Refill Request Message from:  Fax from Pharmacy  Refills Requested: Medication #1:  AMBIEN 10 MG  TABS Take 1 tablet by mouth at bedtime   Last Refilled: 05/13/2010 Faxed request from [facility], [phone].  Initial call taken by: Lowella Petties CMA,  June 03, 2010 3:39 PM  Follow-up for Phone Call        Called to [facility]. Follow-up by: Lowella Petties CMA,  June 03, 2010 4:27 PM    Prescriptions: AMBIEN 10 MG  TABS (ZOLPIDEM TARTRATE) Take 1 tablet by mouth at bedtime  #30 x 5   Entered and Authorized by:   Shaune Leeks MD   Signed by:   Shaune Leeks MD on 06/03/2010   Method used:   Telephoned to ...       [facility] [address] [address] (retail)       [address]       [place], [place]  [address]       Ph: [phone]       Fax: [phone]   RxID:   [id number]

## 2010-09-24 NOTE — Progress Notes (Signed)
Summary: Mobic  Phone Note Refill Request Message from:  Fax from Pharmacy on July 20, 2010 10:14 AM  Refills Requested: Medication #1:  MOBIC 15 MG  TABS 1 DAILY Sonia Baller  Phone:   [phone]   Method Requested: Electronic Initial call taken by: Delilah Shan CMA Duncan Dull),  July 20, 2010 10:14 AM  Follow-up for Phone Call       Follow-up by: Crawford Givens MD,  July 20, 2010 10:32 AM    Prescriptions: MOBIC 15 MG  TABS (MELOXICAM) 1 DAILY  #30 Tablet x 2   Entered and Authorized by:   Crawford Givens MD   Signed by:   Crawford Givens MD on 07/20/2010   Method used:   Electronically to        [facility] [address] [address]* (retail)       [address]       [place], [place]  [address]       Ph: [phone]       Fax: [phone]   RxID:   [id number]

## 2010-09-24 NOTE — Letter (Signed)
Summary: External Correspondence  External Correspondence   Imported By: Valinda Hoar 11/24/2009 15:38:15  _____________________________________________________________________  External Attachment:    Type:   Image     Comment:   External Document

## 2010-09-24 NOTE — Progress Notes (Signed)
Summary: Rx Requip  Phone Note Refill Request Call back at [phone] Message from:  [facility] on September 10, 2009 3:40 PM  Refills Requested: Medication #1:  REQUIP 1 MG  TABS Take 1 tab by mouth at bedtime   Last Refilled: 08/04/2009 Received faxed refill request, please advise   Method Requested: Electronic Initial call taken by: Linde Gillis CMA Duncan Dull),  September 10, 2009 3:41 PM    Prescriptions: REQUIP 1 MG  TABS (ROPINIROLE HCL) Take 1 tab by mouth at bedtime  #30 x 12   Entered and Authorized by:   Shaune Leeks MD   Signed by:   Shaune Leeks MD on 09/10/2009   Method used:   Electronically to        [facility] [address] [address]* (retail)       [address]       [place], [place]  [address]       Ph: [phone]       Fax: [phone]   RxID:   [id number]

## 2010-09-25 ENCOUNTER — Ambulatory Visit: Payer: Self-pay | Admitting: Pulmonary Disease

## 2010-09-25 ENCOUNTER — Ambulatory Visit: Admit: 2010-09-25 | Payer: Self-pay | Admitting: Pulmonary Disease

## 2010-09-30 ENCOUNTER — Telehealth: Payer: Self-pay | Admitting: Family Medicine

## 2010-10-01 ENCOUNTER — Encounter: Payer: Self-pay | Admitting: Family Medicine

## 2010-10-01 ENCOUNTER — Ambulatory Visit (INDEPENDENT_AMBULATORY_CARE_PROVIDER_SITE_OTHER): Payer: MEDICARE | Admitting: Family Medicine

## 2010-10-01 DIAGNOSIS — R1310 Dysphagia, unspecified: Secondary | ICD-10-CM

## 2010-10-08 NOTE — Progress Notes (Signed)
Summary: Requip   Phone Note Refill Request Message from:  Fax from Pharmacy on September 30, 2010 8:54 AM  Refills Requested: Medication #1:  REQUIP 1 MG  TABS Take 1 tab by mouth at bedtime   Last Refilled: 08/24/2010 [facility]fill [facility] from [facility] [phone].  Initial call taken by: Melody Comas,  September 30, 2010 8:55 AM    New/Updated Medications: REQUIP 1 MG  TABS (ROPINIROLE HCL) Take 1 tab by mouth at bedtime Prescriptions: REQUIP 1 MG  TABS (ROPINIROLE HCL) Take 1 tab by mouth at bedtime  #30 x 12   Entered and Authorized by:   Shaune Leeks MD   Signed by:   Shaune Leeks MD on 09/30/2010   Method used:   Electronically to        [facility] [address] [address] (retail)       [address]       [place], [place]  [address]       Ph: [phone]       Fax: [phone]   RxID:   [id number]

## 2010-10-08 NOTE — Assessment & Plan Note (Signed)
Summary: TROUBLE SWALLOWING/CLE  MEDICARE COMPLETE   Vital Signs:  Patient profile:   60 year old male Weight:      232 pounds Temp:     98.2 degrees F oral Pulse rate:   84 / minute Pulse rhythm:   regular BP sitting:   112 / 78  (left arm) Cuff size:   large  Vitals Entered By: Sydell Axon LPN (October 01, 2010 11:56 AM) CC: Trouble swallowing   History of Present Illness: Pt here for three weeks of difficulty with swallowing both liquid and solid foods which has now gotten to feel like  a hanging up  most of the time. The sensation is in the lower posterior pharyngeal area (he holds his lower neck when showing location.) He is almost off cigarettes, which he has smoked for many years. HE has a long history of use. He quit taking Protonix over a year ago due to insurance not covering it at all and him not being able to afford it. He denies burning or pain in the esophageal area. He has had significantly more gas, burping and bloating lately as well.  Current Medications (verified): 1)  Prozac 40 Mg  Caps (Fluoxetine Hcl) .... Take 1 Capsule By Mouth Once A Day 2)  Simvastatin 20 Mg  Tabs (Simvastatin) .... Take 1 Tablet By Mouth Once A Day 3)  Protonix 40 Mg  Tbec (Pantoprazole Sodium) .... Take 1 Tablet By Mouth Once A Day 4)  Lisinopril 20 Mg  Tabs (Lisinopril) .... Take 1 Tablet By Mouth Once A Day 5)  Multivitamins   Tabs (Multiple Vitamin) .... Take 1 Tablet By Mouth Once A Day 6)  Alprazolam 0.5 Mg  Tb24 (Alprazolam) .... Take 1 Tablet By Mouth Two Times A Day 7)  Ambien 10 Mg  Tabs (Zolpidem Tartrate) .... Take 1 Tablet By Mouth At Bedtime 8)  Requip 1 Mg  Tabs (Ropinirole Hcl) .... Take 1 Tab By Mouth At Bedtime 9)  Detrol La 4 Mg  Cp24 (Tolterodine Tartrate) .Marland Kitchen.. 1 Tab By Mouth Once Daily 10)  Mobic 15 Mg  Tabs (Meloxicam) .Marland Kitchen.. 1 Daily 11)  Klor-Con 10 10 Meq  Tbcr (Potassium Chloride) .Marland Kitchen.. 1 Daily By Mouth 12)  Allegra 180 Mg Tabs (Fexofenadine Hcl) .... One Tab By Mouth  Daily 13)  Cardura 2 Mg Tabs (Doxazosin Mesylate) .... Take One By Mouth Daily 14)  Proscar 5 Mg Tabs (Finasteride) .... One Tab By Mouth Once Daily 15)  Anusol-Hc 25 Mg Supp (Hydrocortisone Acetate) .... One Supp Per Rectum Three Times A Day For One Week Then As Needed 16)  Cyclobenzaprine Hcl 10 Mg Tabs (Cyclobenzaprine Hcl) .... One Tab By Mouth Three Times A Day As Needed For Muscle Spasms. 17)  Mucus Relief 400 Mg Tabs (Guaifenesin) .... As Needed 18)  Calcium Antacid Ultra Max St 1000 Mg Chew (Calcium Carbonate Antacid) .... One Daily  Allergies: 1)  Sulfa 2)  * Codiene  Physical Exam  General:  Well-developed,well-nourished,in no acute distress; alert,appropriate and cooperative throughout examination, overweight. Head:  Normocephalic and atraumatic without obvious abnormalities. No apparent alopecia but significant male pattern  balding. Sinuses NT. Lungs:  Normal respiratory effort, chest expands symmetrically. Lungs are clear to auscultation, no crackles or wheezes. Heart:  Normal rate and regular rhythm. S1 and S2 normal without gallop, murmur, click, rub or other extra sounds. Abdomen:  Bowel sounds positive,abdomen soft and non-tender without masses, organomegaly or hernias noted. Protuberant.   Impression & Recommendations:  Problem # 1:  DYSPHAGIA UNSPECIFIED (ICD-787.20) Assessment New Pt has had no documented EGD altho he has had gastric bypass and may have had one due to that, but nothing on the chart. Suggest trying Jogging in a Jug for a few weeks.  If no help, refer to ENT. If no diagnosis then made, will refer to GI.  Complete Medication List: 1)  Prozac 40 Mg Caps (Fluoxetine hcl) .... Take 1 capsule by mouth once a day 2)  Simvastatin 20 Mg Tabs (Simvastatin) .... Take 1 tablet by mouth once a day 3)  Protonix 40 Mg Tbec (Pantoprazole sodium) .... Take 1 tablet by mouth once a day 4)  Lisinopril 20 Mg Tabs (Lisinopril) .... Take 1 tablet by mouth once a  day 5)  Multivitamins Tabs (Multiple vitamin) .... Take 1 tablet by mouth once a day 6)  Alprazolam 0.5 Mg Tb24 (Alprazolam) .... Take 1 tablet by mouth two times a day 7)  Ambien 10 Mg Tabs (Zolpidem tartrate) .... Take 1 tablet by mouth at bedtime 8)  Requip 1 Mg Tabs (Ropinirole hcl) .... Take 1 tab by mouth at bedtime 9)  Detrol La 4 Mg Cp24 (Tolterodine tartrate) .Marland Kitchen.. 1 tab by mouth once daily 10)  Mobic 15 Mg Tabs (Meloxicam) .Marland Kitchen.. 1 daily 11)  Klor-con 10 10 Meq Tbcr (Potassium chloride) .Marland Kitchen.. 1 daily by mouth 12)  Allegra 180 Mg Tabs (Fexofenadine hcl) .... One tab by mouth daily 13)  Cardura 2 Mg Tabs (Doxazosin mesylate) .... Take one by mouth daily 14)  Proscar 5 Mg Tabs (Finasteride) .... One tab by mouth once daily 15)  Anusol-hc 25 Mg Supp (Hydrocortisone acetate) .... One supp per rectum three times a day for one week then as needed 16)  Cyclobenzaprine Hcl 10 Mg Tabs (Cyclobenzaprine hcl) .... One tab by mouth three times a day as needed for muscle spasms. 17)  Mucus Relief 400 Mg Tabs (Guaifenesin) .... As needed 18)  Calcium Antacid Ultra Max St 1000 Mg Chew (Calcium carbonate antacid) .... One daily  Patient Instructions: 1)  Look up Jogging in a Jug.    Orders Added: 1)  Est. Patient Level III [60109]    Current Allergies (reviewed today): SULFA * CODIENE

## 2010-10-09 ENCOUNTER — Other Ambulatory Visit (HOSPITAL_COMMUNITY): Payer: Self-pay | Admitting: Neurosurgery

## 2010-10-09 ENCOUNTER — Encounter (HOSPITAL_COMMUNITY)
Admission: RE | Admit: 2010-10-09 | Discharge: 2010-10-09 | Disposition: A | Discharge status: Home / Self Care | Payer: Medicare Other | Source: Ambulatory Visit | Attending: Neurosurgery | Admitting: Neurosurgery

## 2010-10-09 ENCOUNTER — Ambulatory Visit (HOSPITAL_COMMUNITY)
Admission: RE | Admit: 2010-10-09 | Discharge: 2010-10-09 | Disposition: A | Discharge status: Home / Self Care | Payer: Medicare Other | Source: Ambulatory Visit | Attending: Neurosurgery | Admitting: Neurosurgery

## 2010-10-09 DIAGNOSIS — Z01818 Encounter for other preprocedural examination: Secondary | ICD-10-CM | POA: Insufficient documentation

## 2010-10-09 DIAGNOSIS — M5416 Radiculopathy, lumbar region: Secondary | ICD-10-CM

## 2010-10-09 LAB — SURGICAL PCR SCREEN: Staphylococcus aureus: NEGATIVE

## 2010-10-09 LAB — CBC
MCH: 29.7 pg (ref 26.0–34.0)
MCHC: 33.4 g/dL (ref 30.0–36.0)
MCV: 88.8 fL (ref 78.0–100.0)
Platelets: 291 10*3/uL (ref 150–400)
RDW: 14 % (ref 11.5–15.5)
WBC: 10.8 10*3/uL — ABNORMAL HIGH (ref 4.0–10.5)

## 2010-10-09 LAB — BASIC METABOLIC PANEL
BUN: 8 mg/dL (ref 6–23)
Calcium: 9 mg/dL (ref 8.4–10.5)
Creatinine, Ser: 0.93 mg/dL (ref 0.4–1.5)
GFR calc non Af Amer: >60 mL/min (ref >60)
Potassium: 4 mEq/L (ref 3.5–5.1)

## 2010-10-15 ENCOUNTER — Inpatient Hospital Stay (HOSPITAL_COMMUNITY): Payer: Medicare Other

## 2010-10-15 ENCOUNTER — Observation Stay (HOSPITAL_COMMUNITY)
Admission: RE | Admit: 2010-10-15 | Discharge: 2010-10-16 | Disposition: A | Discharge status: Home / Self Care | Payer: Medicare Other | Source: Ambulatory Visit | Attending: Neurosurgery | Admitting: Neurosurgery

## 2010-10-15 DIAGNOSIS — Z87891 Personal history of nicotine dependence: Secondary | ICD-10-CM | POA: Insufficient documentation

## 2010-10-15 DIAGNOSIS — M5137 Other intervertebral disc degeneration, lumbosacral region: Secondary | ICD-10-CM | POA: Insufficient documentation

## 2010-10-15 DIAGNOSIS — M51379 Other intervertebral disc degeneration, lumbosacral region without mention of lumbar back pain or lower extremity pain: Secondary | ICD-10-CM | POA: Insufficient documentation

## 2010-10-15 DIAGNOSIS — M47817 Spondylosis without myelopathy or radiculopathy, lumbosacral region: Secondary | ICD-10-CM | POA: Insufficient documentation

## 2010-10-15 DIAGNOSIS — M5126 Other intervertebral disc displacement, lumbar region: Principal | ICD-10-CM | POA: Insufficient documentation

## 2010-10-20 ENCOUNTER — Telehealth: Payer: Self-pay | Admitting: Family Medicine

## 2010-10-29 NOTE — Progress Notes (Signed)
Summary: refill request for mobic  Phone Note Refill Request Message from:  Fax from Pharmacy  Refills Requested: Medication #1:  MOBIC 15 MG  TABS 1 DAILY   Last Refilled: 09/16/2010 Faxed request from [facility], [phone].  Initial call taken by: Lowella Petties CMA, AAMA,  October 20, 2010 10:49 AM  Follow-up for Phone Call       Follow-up by: Crawford Givens MD,  October 20, 2010 2:02 PM    Prescriptions: MOBIC 15 MG  TABS (MELOXICAM) 1 DAILY  #30 Tablet x 2   Entered by:   Shaune Leeks MD   Authorized by:   Crawford Givens MD   Signed by:   Shaune Leeks MD on 10/20/2010   Method used:   Electronically to        [facility] [address] [address] (retail)       [address]       [place], [place]  [address]       Ph: [phone]       Fax: [phone]   RxID:   [id number]

## 2010-10-30 NOTE — Op Note (Signed)
NAME:  [name], [name] NO.:  [id number]  MEDICAL RECORD NO.:  [id number]           PATIENT TYPE:  I  LOCATION:  3528                         FACILITY:  [facility]  PHYSICIAN:  Danae Orleans. Venetia Maxon, M.D.  DATE OF BIRTH:  Jun 01, 1951  DATE OF PROCEDURE:  10/15/2010 DATE OF DISCHARGE:                              OPERATIVE REPORT   PREOPERATIVE DIAGNOSIS:  Left L5-S1 herniated disk with spondylosis, degenerative disk disease, and radiculopathy.  POSTOPERATIVE DIAGNOSIS:  Left L5-S1 herniated disk with spondylosis, degenerative disk disease, and radiculopathy.  PROCEDURE:  Left L5-S1 foraminotomy with microdissection.  SURGEON:  Danae Orleans. Venetia Maxon, MD  ASSISTANT:  Cristi Loron, MD  ANESTHESIA:  General endotracheal anesthesia.  ESTIMATED BLOOD LOSS:  Minimal.  COMPLICATIONS:  None.  DISPOSITION:  Recovery.  INDICATIONS:  Mark Morris is a 60 year old man with a large left L5-S1 spondylitic disk herniation.  He has significant left S1 radiculopathy. He has not had any relief with injections and other conservative measures.  It was elected to take him to surgery for decompression of the left S1 nerve root.  DESCRIPTION OF PROCEDURE:  Mark Morris was brought to the operating room.  Following a satisfactory and uncomplicated induction of general endotracheal anesthesia plus intravenous lines, the patient was placed in the prone position on the Wilson frame.  His low back was shaved, prepped, and draped in the usual sterile fashion.  The area of planned incision was infiltrated with local lidocaine.  Incision was made in the midline, carried through the lumbodorsal fascia, was incised in the left side of midline sharply.  Subperiosteal dissection was performed exposing the L5-S1 interspace.  Intraoperative x-ray confirmed correct orientation.  A hemi-semi-laminectomy of L5 was performed with a high- speed drill and completed with Kerrison rongeurs.  The lateral  recess was also decompressed.  Microscope was brought into the field. Ligamentum flavum was detached and removed in piecemeal fashion.  The S1 nerve root was mobilized.  There was a large spondylitic and densely calcified disk herniation which was causing significant left S1 nerve root compression and the foramen appeared to be markedly stenotic.  It was elected not to incise the interspace as it would require osteotomes and Kerrison to remove this calcified disk herniation and I was concerned that the patient would likely develop later instability.  It was, therefore, elected to perform a wide foraminotomy overlying the S1 nerve root and the thecal sac and this was done with combination of drill and Kerrison rongeurs such that the S1 nerve root was widely decompressed as they extended out the spine.  Hemostasis was assured. The wound was irrigated, then bathed in Depo-Medrol and fentanyl.  The self-retaining retractor was removed.  The lumbodorsal fascia was closed with 0 Vicryl sutures, subcutaneous tissues were approximated with 2-0 Vicryl interrupted inverted sutures, and skin edges were approximated with 3-0 Vicryl subcuticular stitch.  The wound was dressed with Dermabond.  The patient was extubated in the operating room and taken to recovery in stable and satisfactory condition, having tolerated the operation well.  Counts were correct at the end of the case.  Danae Orleans. Venetia Maxon, M.D.     JDS/MEDQ  D:  10/15/2010  T:  10/16/2010  Job:  [id number]  Electronically Signed by Maeola Harman M.D. on 10/30/2010 12:16:18 PM

## 2010-11-17 ENCOUNTER — Telehealth: Payer: Self-pay | Admitting: Family Medicine

## 2010-11-17 NOTE — Telephone Encounter (Signed)
Faxed OV, Lab & Sleep Study to Jodie at [facility] ([phone]).

## 2010-12-22 ENCOUNTER — Other Ambulatory Visit: Payer: Self-pay | Admitting: *Deleted

## 2010-12-22 MED ORDER — ZOLPIDEM TARTRATE 10 MG PO TABS: ORAL_TABLET | ORAL | Status: DC

## 2010-12-22 NOTE — Telephone Encounter (Signed)
Please call in 30/0 for pt.

## 2010-12-22 NOTE — Telephone Encounter (Signed)
Rx called to pharmacy as instructed. 

## 2011-01-08 NOTE — Procedures (Signed)
NAME:  Mark Morris, Mark Morris NO.:  [id number]   MEDICAL RECORD NO.:  [id number]          PATIENT TYPE:  OUT   LOCATION:  SLEEP CENTER                 FACILITY:  [facility]   PHYSICIAN:  Marcelyn Bruins, M.D. [facility] DATE OF BIRTH:  [birth year]   DATE OF ADMISSION:  05/03/2004  DATE OF DISCHARGE:  05/03/2004                              NOCTURNAL POLYSOMNOGRAM   REFERRING PHYSICIAN:  Marcelyn Bruins, M.D.   INDICATION FOR THE STUDY:  Hypersomnia with sleep apnea.  The patient has  been diagnosed with severe obstructive sleep apnea and has returned for  formal CPAP titration for pressure optimization.   SLEEP ARCHITECTURE:  The patient had a total sleep time of 398 minutes with  a sleep efficiency of only 80%.  There was adequate REM and very little slow  wave sleep.  The patient had a sleep latency of 29 minutes and a prolonged  REM latency at 139 minutes.   IMPRESSION:  1.  Moderate control of previously diagnosed severe obstructive sleep apnea      with CPAP of 13 cm.  The patient had fairly significant breakthrough      events during his last REM period.  I would recommend a treatment      pressure of 14-15 cm.  The patient normally wears a medium full face      mask of unknown type at home, however, he complained about this being      uncomfortable and resulting in skin breakdown over the bridge of his      nose.  He was therefore changed to a Fisher-Paykel full face mask and      seemed to tolerate this much better.  2.  No clinically significant cardiac arrhythmias.  3.  Very large numbers of leg jerks with significant sleep disruption even      despite appropriate CPAP.  Clinical correlation is suggested.      KC/MEDQ  D:  05/18/2004 10:50:04  T:  05/18/2004 15:09:57  Job:  [id number]

## 2011-01-08 NOTE — Procedures (Signed)
NAME:  Mark Morris, Mark Morris NO.:  [id number]   MEDICAL RECORD NO.:  [id number]          PATIENT TYPE:  OUT   LOCATION:  SLEEP CENTER                 FACILITY:  [facility]   PHYSICIAN:  Marcelyn Bruins, M.D. [facility] DATE OF BIRTH:  1950-11-10   DATE OF ADMISSION:  03/15/2004  DATE OF DISCHARGE:  03/15/2004                              NOCTURNAL POLYSOMNOGRAM   INDICATION FOR STUDY/HISTORY:  Hypersomnia with sleep apnea.   SLEEP ARCHITECTURE:  The patient had total sleep times with 271 minutes,  with only 43 minutes REM and no slow-wave sleep obtained.  The patient had a  prolonged sleep onset latency of 34 minutes as well was his REM latency at  222 minutes.   IMPRESSION/RECOMMENDATION:  1. Severe obstructive sleep apnea/hypopnea syndrome with moderate to severe     O2 desaturation as low as 68%.  The patient had a respiratory disturbance     index of 46 events/hour and the events were clearly worse during REM.  2. Moderate snoring noted throughout the study.  3. Rare PVC noted.  4. Large numbers of leg jerks with significant sleep disruption.  Clinical     coordination is suggested.                                   ______________________________                                Marcelyn Bruins, M.D. [facility]     KC/MEDQ  D:  03/19/2004 16:12:56  T:  03/19/2004 16:46:09  Job:  [id number]

## 2011-01-08 NOTE — Cardiovascular Report (Signed)
   NAME:  Mark Morris, HIPPE NO.:  [id number]   MEDICAL RECORD NO.:  [id number]                   PATIENT TYPE:  OIB   LOCATION:  2854                                 FACILITY:  [facility]   PHYSICIAN:  Rollene Rotunda, M.D.                DATE OF BIRTH:  February 20, 1951   DATE OF PROCEDURE:  01/14/2003  DATE OF DISCHARGE:                              CARDIAC CATHETERIZATION   PRIMARY CARE PHYSICIAN:  Laurita Quint, M.D.   PROCEDURE:  Left heart catheterization, coronary arteriography.   INDICATION:  A patient with chest pain and a nondiagnostic Cardiolite.   PROCEDURE NOTE:  Left heart catheterization was performed via the right  femoral artery.  The artery was cannulated using an arterial wall puncture.  A 6 French arterial sheath was inserted via the modified Seldinger  technique.  Preformed Judkins and a pigtail catheter were utilized.  The  patient tolerated the procedure well and left the lab in stable condition.   RESULTS:   HEMODYNAMICS:  1. LV 106/11.  2. Aortic 109/70.   CORONARIES:  The left main was normal.  The LAD was normal.  The first  diagonal was medium-size and normal.  The circumflex was normal in the AV  groove.  There were two obtuse marginals which were moderate to large in  size and normal.  The right coronary artery was a large dominant vessel.  There was proximal 25% stenosis and mild diffuse luminal irregularities.   LEFT VENTRICULOGRAM:  Left ventriculogram was obtained in the RAO  projection.  The EF was 65% with normal wall motion.   CONCLUSION:  1. Minimal coronary artery plaque.  2. Normal left ventricular function.    PLAN:  The patient will follow up per Dr. Myrtis Ser for workup of tachycardia and  other complaints.  Follow with Dr. Hetty Ely for workup of non-anginal chest  pain.                                                 Rollene Rotunda, M.D.    JH/MEDQ  D:  01/14/2003  T:  01/14/2003  Job:  [id number]   cc:    Laurita Quint, M.D.  [address] [place]  [place] [address]  Fax: [phone]

## 2011-01-16 ENCOUNTER — Encounter: Payer: Self-pay | Admitting: Family Medicine

## 2011-01-20 ENCOUNTER — Other Ambulatory Visit: Payer: Self-pay | Admitting: *Deleted

## 2011-01-20 MED ORDER — ALPRAZOLAM 0.5 MG PO TABS: 0.5000 mg | ORAL_TABLET | Freq: Two times a day (BID) | ORAL | Status: DC | PRN

## 2011-01-20 NOTE — Telephone Encounter (Signed)
Refill request for Ambien 10mg   Qty 30 one tablet by mouth at bedtime last refilled 12/22/2010

## 2011-01-20 NOTE — Telephone Encounter (Signed)
Please call in

## 2011-01-20 NOTE — Telephone Encounter (Signed)
Rx refill phoned into  [facility] pharmacy for Alprazolam, Rx refill for Ambien not needed, clarification on request for Alprazolam refilled

## 2011-01-21 ENCOUNTER — Other Ambulatory Visit: Payer: Self-pay | Admitting: *Deleted

## 2011-01-21 MED ORDER — ZOLPIDEM TARTRATE 10 MG PO TABS: ORAL_TABLET | ORAL | Status: DC

## 2011-01-21 NOTE — Telephone Encounter (Signed)
This script was supposed to be filled yesterday I think. If that prescription did not go through, ok to refill.

## 2011-01-21 NOTE — Telephone Encounter (Signed)
Rx called to pharmacy as instructed. 

## 2011-01-25 ENCOUNTER — Other Ambulatory Visit: Payer: Self-pay | Admitting: *Deleted

## 2011-01-25 ENCOUNTER — Other Ambulatory Visit: Payer: Self-pay | Admitting: Family Medicine

## 2011-01-25 DIAGNOSIS — N401 Enlarged prostate with lower urinary tract symptoms: Secondary | ICD-10-CM

## 2011-01-25 DIAGNOSIS — G2581 Restless legs syndrome: Secondary | ICD-10-CM

## 2011-01-25 DIAGNOSIS — E119 Type 2 diabetes mellitus without complications: Secondary | ICD-10-CM

## 2011-01-25 DIAGNOSIS — E785 Hyperlipidemia, unspecified: Secondary | ICD-10-CM

## 2011-01-25 DIAGNOSIS — F17200 Nicotine dependence, unspecified, uncomplicated: Secondary | ICD-10-CM

## 2011-01-25 MED ORDER — MELOXICAM 15 MG PO TABS: ORAL_TABLET | ORAL | Status: DC

## 2011-01-26 ENCOUNTER — Other Ambulatory Visit: Payer: Self-pay | Admitting: Family Medicine

## 2011-01-26 NOTE — Telephone Encounter (Signed)
Dr. Lorenza Chick pt. Let me know if I need to refill for some.

## 2011-01-27 ENCOUNTER — Other Ambulatory Visit (INDEPENDENT_AMBULATORY_CARE_PROVIDER_SITE_OTHER): Payer: Medicare Other | Admitting: Family Medicine

## 2011-01-27 DIAGNOSIS — E785 Hyperlipidemia, unspecified: Secondary | ICD-10-CM

## 2011-01-27 DIAGNOSIS — N401 Enlarged prostate with lower urinary tract symptoms: Secondary | ICD-10-CM

## 2011-01-27 DIAGNOSIS — N138 Other obstructive and reflux uropathy: Secondary | ICD-10-CM

## 2011-01-27 DIAGNOSIS — Z79899 Other long term (current) drug therapy: Secondary | ICD-10-CM

## 2011-01-27 DIAGNOSIS — G2581 Restless legs syndrome: Secondary | ICD-10-CM

## 2011-01-27 DIAGNOSIS — E119 Type 2 diabetes mellitus without complications: Secondary | ICD-10-CM

## 2011-01-27 DIAGNOSIS — F17200 Nicotine dependence, unspecified, uncomplicated: Secondary | ICD-10-CM

## 2011-01-27 LAB — MICROALBUMIN / CREATININE URINE RATIO
Creatinine,U: 186.8 mg/dL
Microalb Creat Ratio: 0.3 mg/g (ref 0.0–30.0)
Microalb, Ur: 0.5 mg/dL (ref 0.0–1.9)

## 2011-01-27 LAB — RENAL FUNCTION PANEL
Albumin: 4.1 g/dL (ref 3.5–5.2)
BUN: 19 mg/dL (ref 6–23)
CO2: 26 mEq/L (ref 19–32)
Chloride: 108 mEq/L (ref 96–112)
GFR: 87.67 mL/min (ref >60.00)
Phosphorus: 3.7 mg/dL (ref 2.3–4.6)
Potassium: 4.6 mEq/L (ref 3.5–5.1)

## 2011-01-27 LAB — CBC WITH DIFFERENTIAL/PLATELET
Basophils Absolute: 0.1 10*3/uL (ref 0.0–0.1)
Basophils Relative: 0.7 % (ref 0.0–3.0)
Eosinophils Absolute: 0.2 10*3/uL (ref 0.0–0.7)
Lymphocytes Relative: 25 % (ref 12.0–46.0)
MCHC: 32.8 g/dL (ref 30.0–36.0)
MCV: 88.3 fl (ref 78.0–100.0)
Monocytes Absolute: 0.5 10*3/uL (ref 0.1–1.0)
Neutrophils Relative %: 66.6 % (ref 43.0–77.0)
Platelets: 237 10*3/uL (ref 150.0–400.0)
RBC: 4.92 Mil/uL (ref 4.22–5.81)

## 2011-01-27 LAB — HEPATIC FUNCTION PANEL
AST: 32 U/L (ref 0–37)
Alkaline Phosphatase: 130 U/L — ABNORMAL HIGH (ref 39–117)
Total Bilirubin: 1.3 mg/dL — ABNORMAL HIGH (ref 0.3–1.2)

## 2011-01-27 LAB — LIPID PANEL
LDL Cholesterol: 126 mg/dL — ABNORMAL HIGH (ref 0–99)
Total CHOL/HDL Ratio: 4

## 2011-01-27 LAB — TSH: TSH: 0.82 u[IU]/mL (ref 0.35–5.50)

## 2011-02-01 ENCOUNTER — Other Ambulatory Visit: Payer: Self-pay | Admitting: *Deleted

## 2011-02-01 ENCOUNTER — Ambulatory Visit (INDEPENDENT_AMBULATORY_CARE_PROVIDER_SITE_OTHER): Payer: Medicare Other | Admitting: Family Medicine

## 2011-02-01 ENCOUNTER — Encounter: Payer: Self-pay | Admitting: Family Medicine

## 2011-02-01 DIAGNOSIS — F411 Generalized anxiety disorder: Secondary | ICD-10-CM

## 2011-02-01 DIAGNOSIS — G4700 Insomnia, unspecified: Secondary | ICD-10-CM

## 2011-02-01 DIAGNOSIS — E119 Type 2 diabetes mellitus without complications: Secondary | ICD-10-CM

## 2011-02-01 DIAGNOSIS — E785 Hyperlipidemia, unspecified: Secondary | ICD-10-CM

## 2011-02-01 DIAGNOSIS — F17200 Nicotine dependence, unspecified, uncomplicated: Secondary | ICD-10-CM

## 2011-02-01 DIAGNOSIS — I471 Supraventricular tachycardia: Secondary | ICD-10-CM

## 2011-02-01 DIAGNOSIS — I10 Essential (primary) hypertension: Secondary | ICD-10-CM

## 2011-02-01 NOTE — Assessment & Plan Note (Signed)
Not officially diabetic. Would have him mentally consider himself as one though as his diet and lifestyle need to be as if he is. A1C 6.0 where it has been mid 5s until now.

## 2011-02-01 NOTE — Assessment & Plan Note (Signed)
Good today. Cont to follow.

## 2011-02-01 NOTE — Assessment & Plan Note (Signed)
Rate well controlled. Cont as is.

## 2011-02-01 NOTE — Patient Instructions (Addendum)
Refer back to Dr Myrtis Ser for chronic stable angina. RTC 6 mos.

## 2011-02-01 NOTE — Assessment & Plan Note (Signed)
Comes and goes. Discussed approach.

## 2011-02-01 NOTE — Assessment & Plan Note (Signed)
Encouraged to quit. He has enough problems without compounding them with smoking as well. "I have really cut down." Then needs to just quit completely.

## 2011-02-01 NOTE — Assessment & Plan Note (Signed)
Discussed, adequate unless considered diabetic. Still would try to get LDL lower.

## 2011-02-01 NOTE — Assessment & Plan Note (Signed)
Good nos except for today with him being agitated. WIll follow.Cont curr meds. BP Readings from Last 3 Encounters:  02/01/11 150/70  10/01/10 112/78  08/13/10 132/76

## 2011-02-01 NOTE — Progress Notes (Signed)
Subjective:    Patient ID: Mark Morris, male    DOB: 1951-05-27, 60 y.o.   MRN: [id number]  HPI Pt here for Comp Exam. He is frustrated for a couple of reasons this afternoon and therefore thinks his BP elevated. He has no energy and with exertion he has chest pain. He has SOB often and baseline has some chestpain. His nose is running constantly. He feels his spouse is having dementia. She was out of Lyrica and then had it refilled and had an episode while driving to church the day after. He was told she needs to be seen. She quit the Lyrica and things are back to normal.  He had eye exam with new prescription in Apr, no dilated exam tho "I didn't know I was diabetic" and officially I guess he isn't. His A1c has been traditionally below 6.0 until this one whicjh    Review of Systems  Constitutional: Negative for fever, chills, diaphoresis, appetite change, fatigue and unexpected weight change.  HENT: Negative for hearing loss, ear pain, tinnitus and ear discharge.   Eyes: Positive for visual disturbance (new prescription last Spring.). Negative for pain and discharge.  Respiratory: Negative for cough, shortness of breath and wheezing.   Cardiovascular: Positive for chest pain and palpitations.        SOB w/ exertion  Gastrointestinal: Negative for nausea, vomiting, abdominal pain, diarrhea, constipation and blood in stool.       No heartburn or swallowing problems.  Genitourinary: Negative for dysuria, frequency and difficulty urinating.       No nocturia  Musculoskeletal: Positive for back pain (s/p back surgery l4/5.). Negative for myalgias and arthralgias.  Skin: Negative for rash.       No itching or dryness. To see Dr Gwen Pounds for lesions on face.  Neurological: Negative for tremors and numbness.       No tingling or balance problems.  Hematological: Negative for adenopathy. Does not bruise/bleed easily.  Psychiatric/Behavioral: Negative for dysphoric mood and agitation.         Objective:   Physical Exam  Constitutional: He is oriented to person, place, and time. He appears well-developed and well-nourished. No distress.  HENT:  Head: Normocephalic and atraumatic.  Right Ear: External ear normal.  Left Ear: External ear normal.  Nose: Nose normal.  Mouth/Throat: Oropharynx is clear and moist.       Hearing nml at 15 feet with whispered test.  Eyes: Conjunctivae and EOM are normal. Pupils are equal, round, and reactive to light. Right eye exhibits no discharge. Left eye exhibits no discharge. No scleral icterus.  Neck: Normal range of motion. Neck supple. No thyromegaly present.  Cardiovascular: Normal rate, regular rhythm, normal heart sounds and intact distal pulses.   No murmur heard. Pulmonary/Chest: Effort normal and breath sounds normal. No respiratory distress. He has no wheezes.  Abdominal: Soft. Bowel sounds are normal. He exhibits no distension and no mass. There is no tenderness. There is no rebound and no guarding.  Genitourinary: Rectum normal, prostate normal and penis normal. Guaiac negative stool.       Prostate 20gms   Musculoskeletal: Normal range of motion. He exhibits no edema.  Lymphadenopathy:    He has no cervical adenopathy.  Neurological: He is alert and oriented to person, place, and time. Coordination normal.       Monofilament nml bilat.  Skin: Skin is warm and dry. No rash noted. He is not diaphoretic.  Psychiatric: He has a normal mood and  affect. His behavior is normal. Judgment and thought content normal.          Assessment & Plan:  HMPE  I have personally reviewed the Medicare Annual Wellness questionnaire and have noted 1. The patient's medical and social history 2. Their use of alcohol, tobacco or illicit drugs 3. Their current medications and supplements 4. The patient's functional ability including ADL's, fall risks, home safety risks and hearing or visual             Impairment. Recent eye exam, hearing  adequate via whispered test. 5. Diet and physical activities 6. Evidence for depression or mood disorders

## 2011-02-02 MED ORDER — CYCLOBENZAPRINE HCL 10 MG PO TABS: 10.0000 mg | ORAL_TABLET | Freq: Three times a day (TID) | ORAL | Status: DC | PRN

## 2011-02-03 ENCOUNTER — Encounter: Payer: Self-pay | Admitting: Family Medicine

## 2011-02-09 ENCOUNTER — Encounter: Payer: Self-pay | Admitting: Physician Assistant

## 2011-02-09 ENCOUNTER — Ambulatory Visit (INDEPENDENT_AMBULATORY_CARE_PROVIDER_SITE_OTHER): Payer: Medicare Other | Admitting: Physician Assistant

## 2011-02-09 ENCOUNTER — Inpatient Hospital Stay (HOSPITAL_COMMUNITY)
Admission: EM | Admit: 2011-02-09 | Discharge: 2011-02-10 | DRG: 287 | Disposition: A | Discharge status: Home / Self Care | Payer: Medicare Other | Attending: Internal Medicine | Admitting: Internal Medicine

## 2011-02-09 ENCOUNTER — Emergency Department (HOSPITAL_COMMUNITY): Payer: Medicare Other

## 2011-02-09 VITALS — BP 126/76 | HR 73 | Resp 18 | Ht 71.0 in | Wt 233.1 lb

## 2011-02-09 DIAGNOSIS — F411 Generalized anxiety disorder: Secondary | ICD-10-CM | POA: Diagnosis present

## 2011-02-09 DIAGNOSIS — G4733 Obstructive sleep apnea (adult) (pediatric): Secondary | ICD-10-CM | POA: Diagnosis present

## 2011-02-09 DIAGNOSIS — E119 Type 2 diabetes mellitus without complications: Secondary | ICD-10-CM

## 2011-02-09 DIAGNOSIS — R079 Chest pain, unspecified: Secondary | ICD-10-CM

## 2011-02-09 DIAGNOSIS — F3289 Other specified depressive episodes: Secondary | ICD-10-CM | POA: Diagnosis present

## 2011-02-09 DIAGNOSIS — E785 Hyperlipidemia, unspecified: Secondary | ICD-10-CM | POA: Diagnosis present

## 2011-02-09 DIAGNOSIS — I10 Essential (primary) hypertension: Secondary | ICD-10-CM

## 2011-02-09 DIAGNOSIS — Z860100 Personal history of colon polyps, unspecified: Secondary | ICD-10-CM

## 2011-02-09 DIAGNOSIS — F329 Major depressive disorder, single episode, unspecified: Secondary | ICD-10-CM | POA: Diagnosis present

## 2011-02-09 DIAGNOSIS — Z7982 Long term (current) use of aspirin: Secondary | ICD-10-CM

## 2011-02-09 DIAGNOSIS — N400 Enlarged prostate without lower urinary tract symptoms: Secondary | ICD-10-CM | POA: Diagnosis present

## 2011-02-09 DIAGNOSIS — J309 Allergic rhinitis, unspecified: Secondary | ICD-10-CM | POA: Diagnosis present

## 2011-02-09 DIAGNOSIS — Z882 Allergy status to sulfonamides status: Secondary | ICD-10-CM

## 2011-02-09 DIAGNOSIS — I2510 Atherosclerotic heart disease of native coronary artery without angina pectoris: Secondary | ICD-10-CM | POA: Diagnosis present

## 2011-02-09 DIAGNOSIS — R0789 Other chest pain: Principal | ICD-10-CM | POA: Diagnosis present

## 2011-02-09 DIAGNOSIS — K219 Gastro-esophageal reflux disease without esophagitis: Secondary | ICD-10-CM | POA: Diagnosis present

## 2011-02-09 LAB — DIFFERENTIAL
Basophils Absolute: 0.1 10*3/uL (ref 0.0–0.1)
Basophils Relative: 1 % (ref 0–1)
Lymphocytes Relative: 34 % (ref 12–46)
Neutro Abs: 4.3 10*3/uL (ref 1.7–7.7)
Neutrophils Relative %: 56 % (ref 43–77)

## 2011-02-09 LAB — PRO B NATRIURETIC PEPTIDE: Pro B Natriuretic peptide (BNP): 23.9 pg/mL (ref 0–125)

## 2011-02-09 LAB — CBC
MCV: 84.8 fL (ref 78.0–100.0)
Platelets: 226 10*3/uL (ref 150–400)
RBC: 4.6 MIL/uL (ref 4.22–5.81)
WBC: 7.7 10*3/uL (ref 4.0–10.5)

## 2011-02-09 LAB — COMPREHENSIVE METABOLIC PANEL
Albumin: 3.3 g/dL — ABNORMAL LOW (ref 3.5–5.2)
BUN: 15 mg/dL (ref 6–23)
CO2: 26 mEq/L (ref 19–32)
Chloride: 106 mEq/L (ref 96–112)
Creatinine, Ser: 0.7 mg/dL (ref 0.50–1.35)
GFR calc Af Amer: >60 mL/min (ref >60)
GFR calc non Af Amer: >60 mL/min (ref >60)
Glucose, Bld: 128 mg/dL — ABNORMAL HIGH (ref 70–99)
Total Bilirubin: 1.1 mg/dL (ref 0.3–1.2)

## 2011-02-09 LAB — APTT: aPTT: 30 seconds (ref 24–37)

## 2011-02-09 LAB — PROTIME-INR: INR: 1.02 (ref 0.00–1.49)

## 2011-02-09 LAB — CARDIAC PANEL(CRET KIN+CKTOT+MB+TROPI): Total CK: 117 U/L (ref 7–232)

## 2011-02-09 LAB — HEPARIN LEVEL (UNFRACTIONATED): Heparin Unfractionated: 0.34 IU/mL (ref 0.30–0.70)

## 2011-02-09 LAB — CK TOTAL AND CKMB (NOT AT ARMC): Relative Index: INVALID (ref 0.0–2.5)

## 2011-02-09 NOTE — Assessment & Plan Note (Signed)
Cover with sliding scale insulin in the hospital.  He is currently diet controlled.

## 2011-02-09 NOTE — Assessment & Plan Note (Signed)
He has more typical features than atypical features.  His symptoms are somewhat concerning for angina given that he has multiple risk factors including diabetes, hypertension and hyperlipidemia.  He is also continuing to smoke cigarettes and has a family history of CAD.  The fact that he had only minimal plaque at cardiac catheterization 2004 is somewhat reassuring.  However, with significant risk factors, he is certainly at risk for developing obstructive coronary disease.  The fact that he has rest pain today is also worrisome.  He has had pain constantly for the last 2 weeks.  This is somewhat atypical.  But, with his risk factors, etc., we have suggested admission.  He will be transported via EMS.  Treat with ASA, heparin, NTG.  Add low dose beta blocker.  Check serial markers.  Check CXR and BNP given his S3 on exam.  He does not appear volume overloaded though.   Plan cath tomorrow, or sooner if symptoms unstable.

## 2011-02-09 NOTE — Progress Notes (Signed)
For admaission for chest pain and cath [employer]

## 2011-02-09 NOTE — Assessment & Plan Note (Signed)
Continue simvastatin. 

## 2011-02-09 NOTE — Progress Notes (Signed)
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 male with a history of minimal coronary plaque by cardiac catheterization in 2004, diabetes, hypertension, hyperlipidemia and ongoing tobacco abuse, presents today with complaints of chest discomfort over the last couple of weeks.  He notes this coming on with exertion and improving with rest.  However, he also notes continuous chest discomfort over the last one to 2 weeks that has not subsided.  He clearly gets worse with exertion.  He clearly has associated shortness of breath.  He denies any radiating symptoms.  He denies syncope.  He does note diaphoresis.  He denies nausea.  He denies palpitations.  He sleeps on 2 pillows chronically.  This is unchanged.  He denies PND or edema.  He is having chest discomfort in the office today.  Past Medical History  Diagnosis Date  . Hyperlipidemia 08/23/1997  . Allergy 2002    allergic rhinitis  . Hypertension 1980  . Anxiety 08/24/1999  . Depression 08/24/1999  . Diabetes mellitus 08/24/2003    typeII  . Enteritis 10/18/2004    CT abd. Enteritis//CT pelvis Sacroiliac Spurring  . Sigmoid polyp 07/03/2007    colonoscopy Small internal hemorrhoids; Sigmoid polyp B9 (Dr. Elliott)//colonoscopy normal 04/06/2004  . CAD (coronary artery disease)     Nonobstructive by cardiac catheterization 5/04: Proximal RCA 25%, EF 65%;  Myoview 12/05: no scar or ischemia, EF 58%  . Sleep apnea   . BPH (benign prostatic hyperplasia)   . Paroxysmal SVT (supraventricular tachycardia)     History of    Past Surgical History  Procedure Date  . Cardiac catheterization 01/14/2003    Min plaque EF65%// Cath normal (Dr. Juliann Pares 2000)//ECHO Mild AAA TrMR Ef61% 05/30/2002 (Dr.Callwood)  . Gastric fundoplication [facility]  . Hernia repair 01/02/2006    right ventral Hernia Repair Laparascopic Leretha Pol  . Gastric bypass 11/24/2004  . Sleep study 03/2004   Sleep Study Hypopneas 60/hr ; Kicks 160/hr  . Ankle fracture surgery 02/2003  . Carpal tunnel release   . Back surgery   . Cholecystectomy   . Appendectomy      Current Outpatient Prescriptions  Medication Sig Dispense Refill  . ALPRAZolam (XANAX) 0.5 MG tablet Take 1 tablet (0.5 mg total) by mouth 2 (two) times daily as needed for sleep.  30 tablet  5  . calcium elemental as carbonate (CALCIUM ANTACID ULTRA) 400 MG tablet Chew 1,000 mg by mouth daily.        . cyclobenzaprine (FLEXERIL) 10 MG tablet Take 1 tablet (10 mg total) by mouth 3 (three) times daily as needed. Muscle spasms  60 tablet  0  . doxazosin (CARDURA) 2 MG tablet Take 2 mg by mouth daily.        . fexofenadine (ALLEGRA) 180 MG tablet Take 180 mg by mouth daily.        . finasteride (PROSCAR) 5 MG tablet Take 5 mg by mouth daily.        Marland Kitchen FLUoxetine (PROZAC) 40 MG capsule Take 1 mg by mouth daily.        Marland Kitchen guaifenesin (HUMIBID E) 400 MG TABS Take 400 mg by mouth every 6 (six) hours as needed.        Marland Kitchen HYDROcodone-acetaminophen (NORCO) 10-325 MG per tablet Take 1 tablet by mouth every 6 (six) hours as needed.        Marland Kitchen lisinopril (PRINIVIL,ZESTRIL) 20 MG tablet Take 20 mg  by mouth daily.        . meloxicam (MOBIC) 15 MG tablet Take one tablet by mouth as needed daily, use sparingly and take with food  30 tablet  1  . Multiple Vitamin (MULTIVITAMIN) tablet Take 1 tablet by mouth daily.        . pantoprazole (PROTONIX) 40 MG tablet Take 1 mg by mouth daily.        . potassium chloride (KLOR-CON) 10 MEQ CR tablet Take 10 mEq by mouth daily.        Marland Kitchen rOPINIRole (REQUIP) 1 MG tablet Take 1 mg by mouth at bedtime.        . simvastatin (ZOCOR) 20 MG tablet TAKE ONE TABLET BY MOUTH EVERY DAY  90 tablet  3  . tolterodine (DETROL LA) 4 MG 24 hr capsule Take 4 mg by mouth daily.        Marland Kitchen zolpidem (AMBIEN) 10 MG tablet Take 1 tablet by mouth nightly at bedtime.  30 tablet  0    Allergies: Allergies  Allergen Reactions  . Codeine    . Sulfonamide Derivatives     History  Substance Use Topics  . Smoking status: Current Everyday Smoker -- 0.3 packs/day for 40 years  . Smokeless tobacco: Never Used  . Alcohol Use: No    Family history:  His mother had bypass surgery in her mid 50s.  ROS:  See history of present illness.  He denies fevers, chills, melena, hematochezia, vomiting, diarrhea, abdominal pain, dysphagia.  All other systems reviewed and negative.  Vital Signs: BP 114/79  Pulse 73  Resp 18  Ht 5\' 11"  (1.803 m)  Wt 233 lb 1.9 oz (105.743 kg)  BMI 32.51 kg/m2  PHYSICAL EXAM: Well nourished, well developed, in no acute distress HEENT: normal Neck: no JVD Endocrine: No thyromegaly Vascular: No carotid bruits, DP/PT 2+ bilaterally Cardiac:  normal S1, S2; RRR; no murmur;Positive S3 Lungs:  Coarse Breath sounds bilaterally that clear with coughing, no wheezing, rhonchi or rales Abd: soft, nontender, no hepatomegaly Ext: no edema Skin: warm and dry Neuro:  CNs 2-12 intact, no focal abnormalities noted Psych: Normal affect  EKG:  Sinus rhythm, heart rate 73, normal axis, no ischemic changes  ASSESSMENT AND PLAN:

## 2011-02-09 NOTE — Assessment & Plan Note (Signed)
Controlled.  Continue current medicines.

## 2011-02-10 ENCOUNTER — Encounter: Payer: Self-pay | Admitting: *Deleted

## 2011-02-10 ENCOUNTER — Encounter: Payer: Self-pay | Admitting: Physician Assistant

## 2011-02-10 ENCOUNTER — Encounter: Payer: Self-pay | Admitting: Cardiology

## 2011-02-10 LAB — CBC
HCT: 36.3 % — ABNORMAL LOW (ref 39.0–52.0)
Hemoglobin: 12.4 g/dL — ABNORMAL LOW (ref 13.0–17.0)
MCH: 28.8 pg (ref 26.0–34.0)
MCHC: 34.2 g/dL (ref 30.0–36.0)
RBC: 4.31 MIL/uL (ref 4.22–5.81)

## 2011-02-10 LAB — GLUCOSE, CAPILLARY: Glucose-Capillary: 97 mg/dL (ref 70–99)

## 2011-02-10 LAB — LIPID PANEL
Cholesterol: 146 mg/dL (ref 0–200)
HDL: 47 mg/dL (ref >39)
Total CHOL/HDL Ratio: 3.1 RATIO

## 2011-02-10 LAB — CARDIAC PANEL(CRET KIN+CKTOT+MB+TROPI)
Relative Index: 1.8 (ref 0.0–2.5)
Troponin I: <0.3 ng/mL (ref <0.30)

## 2011-02-11 ENCOUNTER — Telehealth: Payer: Self-pay | Admitting: *Deleted

## 2011-02-11 NOTE — Telephone Encounter (Signed)
Appt made and patient advised and also glucometer and lancets/strips

## 2011-02-11 NOTE — Telephone Encounter (Signed)
Patient called [facility] and wanted them to

## 2011-02-11 NOTE — Telephone Encounter (Signed)
Please call in rx for a glucometer and 50 lancets/strips.  Have him check fasting sugar each AM and then have a follow up appointment with either me or Dr. Hetty Ely in 1-2 weeks.  Bring in the numbers.  I wouldn't change meds in the meantime.  Thanks.

## 2011-02-11 NOTE — Telephone Encounter (Signed)
Ask them to contact our office and get put back on diabetes medication and have [place] send diabetes checking supplies to [facility]. Patient was taken off these because of gastric bypass and losing wieght. Patient says he was recently in the hospital of heart cath and his blood sugar was running in the 200. I advise pharmacist that patient would need appt but, would ask a doctor first.. Please advise

## 2011-02-11 NOTE — Telephone Encounter (Signed)
Patient called and said that his sugar was 231 yesterday at the hospital and he says that when he was in last week for his cpx Dr. Hetty Ely told him he was borderline diabetic. He is asking if he can get glucometer and supplies called in and also medication. Please advise.

## 2011-02-23 ENCOUNTER — Other Ambulatory Visit: Payer: Self-pay | Admitting: *Deleted

## 2011-02-23 NOTE — Telephone Encounter (Signed)
Received faxed refill request from pharmacy. Is it okay to refill medication? Please verify dispense amount and refills.

## 2011-02-24 MED ORDER — ZOLPIDEM TARTRATE 10 MG PO TABS: ORAL_TABLET | ORAL | Status: DC

## 2011-02-25 NOTE — Telephone Encounter (Signed)
Refill called to pharmacy.

## 2011-02-26 ENCOUNTER — Encounter: Payer: Self-pay | Admitting: Family Medicine

## 2011-02-26 ENCOUNTER — Ambulatory Visit (INDEPENDENT_AMBULATORY_CARE_PROVIDER_SITE_OTHER): Payer: Medicare Other | Admitting: Family Medicine

## 2011-02-26 VITALS — BP 144/84 | HR 76 | Temp 98.3°F | Wt 238.8 lb

## 2011-02-26 DIAGNOSIS — E119 Type 2 diabetes mellitus without complications: Secondary | ICD-10-CM

## 2011-02-26 DIAGNOSIS — G4700 Insomnia, unspecified: Secondary | ICD-10-CM

## 2011-02-26 MED ORDER — ZOLPIDEM TARTRATE 10 MG PO TABS: ORAL_TABLET | ORAL | Status: DC

## 2011-02-26 NOTE — Progress Notes (Signed)
  Subjective:    Patient ID: Newman Nickels, male    DOB: July 24, 1951, 60 y.o.   MRN: [id number]  HPI Pt here for his desire to discuss diabetes. He has had cath with no blockage but an irregular heartbeat. His BS before his catheterization was 231 when checked. He was kept overnight and his sugar in the morning after three units of insulin "came down."  The labs from his hospitalization in the chart all are less than 100 times three and one 128. He has tried to monitor his sugar at home, it fluctuates from 80s to 140 occas.    Review of Systems  Constitutional: Negative for fever, chills, diaphoresis, activity change, appetite change and fatigue.  HENT: Negative for hearing loss, ear pain, congestion, sore throat, rhinorrhea, neck pain, neck stiffness, postnasal drip, sinus pressure, tinnitus and ear discharge.   Eyes: Negative for pain, discharge and visual disturbance.  Respiratory: Negative for cough, shortness of breath and wheezing.   Cardiovascular: Negative for chest pain and palpitations.       No SOB w/ exertion  Gastrointestinal:       No heartburn or swallowing problems.  Genitourinary:       No nocturia  Skin:       No itching or dryness.  Neurological:       No tingling or balance problems.  All other systems reviewed and are negative.       Objective:   Physical Exam  Constitutional: He appears well-developed and well-nourished. No distress.  HENT:  Head: Normocephalic and atraumatic.  Right Ear: External ear normal.  Left Ear: External ear normal.  Nose: Nose normal.  Mouth/Throat: Oropharynx is clear and moist.  Eyes: Conjunctivae and EOM are normal. Pupils are equal, round, and reactive to light. Right eye exhibits no discharge. Left eye exhibits no discharge.  Neck: Normal range of motion. Neck supple.  Cardiovascular: Normal rate and regular rhythm.   Pulmonary/Chest: Effort normal and breath sounds normal. He has no wheezes.  Lymphadenopathy:    He has no  cervical adenopathy.  Skin: He is not diaphoretic.          Assessment & Plan:

## 2011-02-26 NOTE — Assessment & Plan Note (Signed)
Pt requested script for Ambien that will last more than thirty days. He admits that he cannot sleep without taking the pill. Gave script for one month with five refills.

## 2011-02-26 NOTE — Assessment & Plan Note (Signed)
Shown four day progression scheme to monitor his BS. Do for two weeks and RTC to discuss need for medication. He is currently on nothing for his sugar.

## 2011-02-26 NOTE — Patient Instructions (Signed)
RTC 25 or 26 Jul with sugar diary.

## 2011-03-01 ENCOUNTER — Ambulatory Visit (INDEPENDENT_AMBULATORY_CARE_PROVIDER_SITE_OTHER): Payer: Medicare Other | Admitting: Physician Assistant

## 2011-03-01 ENCOUNTER — Encounter: Payer: Self-pay | Admitting: Physician Assistant

## 2011-03-01 VITALS — BP 132/92 | HR 86 | Ht 71.0 in | Wt 235.8 lb

## 2011-03-01 DIAGNOSIS — I2510 Atherosclerotic heart disease of native coronary artery without angina pectoris: Secondary | ICD-10-CM

## 2011-03-01 DIAGNOSIS — E785 Hyperlipidemia, unspecified: Secondary | ICD-10-CM

## 2011-03-01 DIAGNOSIS — F17200 Nicotine dependence, unspecified, uncomplicated: Secondary | ICD-10-CM

## 2011-03-01 NOTE — Assessment & Plan Note (Signed)
Managed by PCP

## 2011-03-01 NOTE — Assessment & Plan Note (Signed)
We discussed the need for cessation. 

## 2011-03-01 NOTE — Assessment & Plan Note (Signed)
Continue medical therapy for risk reduction.  Continue ASA.  Follow up with Dr. Myrtis Ser in one year.

## 2011-03-01 NOTE — Patient Instructions (Signed)
Your physician wants you to follow-up in: 1 YEAR WITH DR. KATZ AS PER SCOTT WEAVER, PA-C You will receive a reminder letter in the mail two months in advance. If you don't receive a letter, please call our office to schedule the follow-up appointment.

## 2011-03-01 NOTE — Progress Notes (Signed)
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 male with a history of minimal coronary plaque by cardiac catheterization in 2004, diabetes, hypertension, hyperlipidemia and ongoing tobacco abuse.  He was admitted from the office 6/19 with complaints of chest pain that sounded consistent with unstable angina.  Cardiac catheterization performed 6/20 demonstrated a mid RCA stenosis of 20-25% and normal LV function.  Given minimal plaque disease in his cardiac catheterization, medical therapy was recommended.  Labs: Potassium 3.5, creatinine 0.7, hemoglobin 12.4, TC 146, TG 72, HDL 47, LDL 85, ALT 42, TSH 0.82, Z6X 6.0.  Chest x-ray was negative.  Cardiac enzymes were also negative x3.  He returns today for followup.  Doing well.  No chest pain, dyspnea or syncope.  No orthopnea, PND, edema.  He is still smoking.  Past Medical History  Diagnosis Date  . Hyperlipidemia 08/23/1997  . Allergy 2002    allergic rhinitis  . Hypertension 1980  . Anxiety 08/24/1999  . Depression 08/24/1999  . Diabetes mellitus 08/24/2003    typeII  . Enteritis 10/18/2004    CT abd. Enteritis//CT pelvis Sacroiliac Spurring  . Sigmoid polyp 07/03/2007    colonoscopy Small internal hemorrhoids; Sigmoid polyp B9 (Dr. Elliott)//colonoscopy normal 04/06/2004  . CAD (coronary artery disease)     Nonobstructive by cardiac catheterization 5/04: Proximal RCA 25%, EF 65%;  Myoview 12/05: no scar or ischemia, EF 58%  . Sleep apnea   . BPH (benign prostatic hyperplasia)   . Paroxysmal SVT (supraventricular tachycardia)     History of     Current Outpatient Prescriptions  Medication Sig Dispense Refill  . ALPRAZolam (XANAX) 0.5 MG tablet Take 1 tablet (0.5 mg total) by mouth 2 (two) times daily as needed for sleep.  30 tablet  5  . calcium elemental as carbonate (CALCIUM ANTACID ULTRA) 400 MG tablet Chew 1,000 mg by mouth daily.        . cyclobenzaprine (FLEXERIL) 10 MG  tablet Take 1 tablet (10 mg total) by mouth 3 (three) times daily as needed. Muscle spasms  60 tablet  0  . doxazosin (CARDURA) 2 MG tablet Take 2 mg by mouth daily.        . fexofenadine (ALLEGRA) 180 MG tablet Take 180 mg by mouth daily.        . finasteride (PROSCAR) 5 MG tablet Take 5 mg by mouth daily.        Marland Kitchen FLUoxetine (PROZAC) 40 MG capsule Take 1 mg by mouth daily.        Marland Kitchen guaifenesin (HUMIBID E) 400 MG TABS Take 400 mg by mouth every 6 (six) hours as needed.        Marland Kitchen HYDROcodone-acetaminophen (NORCO) 10-325 MG per tablet Take 1 tablet by mouth every 6 (six) hours as needed.        Marland Kitchen lisinopril (PRINIVIL,ZESTRIL) 20 MG tablet Take 20 mg by mouth daily.        . meloxicam (MOBIC) 15 MG tablet Take one tablet by mouth as needed daily, use sparingly and take with food  30 tablet  1  . Multiple Vitamin (MULTIVITAMIN) tablet Take 1 tablet by mouth daily.        . pantoprazole (PROTONIX) 40 MG tablet Take 1 mg by mouth daily.        . potassium chloride (KLOR-CON) 10 MEQ CR tablet Take 10 mEq by mouth daily.        Marland Kitchen rOPINIRole (REQUIP) 1 MG  tablet Take 1 mg by mouth at bedtime.        . simvastatin (ZOCOR) 20 MG tablet TAKE ONE TABLET BY MOUTH EVERY DAY  90 tablet  3  . tolterodine (DETROL LA) 4 MG 24 hr capsule Take 4 mg by mouth daily.        Marland Kitchen zolpidem (AMBIEN) 10 MG tablet Take 1 tablet by mouth nightly at bedtime.  30 tablet  5    Allergies: Allergies  Allergen Reactions  . Codeine   . Sulfonamide Derivatives     Vital Signs: BP 132/92  Pulse 86  Ht 5\' 11"  (1.803 m)  Wt 235 lb 12.8 oz (106.958 kg)  BMI 32.89 kg/m2  PHYSICAL EXAM: Well nourished, well developed, in no acute distress HEENT: normal Neck: no JVD Cardiac:  normal S1, S2; RRR; no murmur Lungs:  Clear to auscultation bilaterally, no wheezing, rhonchi or rales Abd: soft, nontender, no hepatomegaly Ext: no edema Skin: warm and dry Neuro:  CNs 2-12 intact, no focal abnormalities noted  EKG:  Sinus rhythm,  heart rate 86, normal axis, PAC, no acute changes  ASSESSMENT AND PLAN:

## 2011-03-11 NOTE — Cardiovascular Report (Signed)
  NAMEMarland Kitchen  Mark Morris, Mark Morris NO.:  [id number]  MEDICAL RECORD NO.:  [id number]  LOCATION:  3729                         FACILITY:  [facility]  PHYSICIAN:  Veverly Fells. Excell Seltzer, MD  DATE OF BIRTH:  02-Sep-1950  DATE OF PROCEDURE: DATE OF DISCHARGE:  02/10/2011                           CARDIAC CATHETERIZATION   PROCEDURES: 1. Left heart catheterization. 2. Selective coronary angiography. 3. Left ventricular angiography.  PROCEDURAL INDICATIONS:  [name] is a 60 year old gentleman with hyperlipidemia and type 2 diabetes.  He has had previous cardiac catheterization in 2004 when he presented with chest pain, and he was found to have minimal nonobstructive disease.  He presented with recurrent chest pain that was suggestive of unstable angina, and he was referred for cardiac cath.  Risks and indications of procedure were reviewed with the patient. Informed consent was obtained.  The right wrist was prepped, draped, andanesthetized with 1% lidocaine.  Using modified Seldinger technique, a 5- French sheath was placed in the right radial artery.  A 5-French TIG catheter was used for coronary and left ventricular angiography.  A pullback gradient across the aortic valve was recorded.  The patient tolerated the procedure well.  There were no immediate complications.  PROCEDURAL FINDINGS:  Left ventricular pressure is 137/19, aortic pressure is 133/82 with a mean of 104.  The left mainstem is widely patent.  There is no obstructive disease. It divides into the LAD and left circumflex.  LAD:  The LAD is widely patent and it reaches the left ventricular apex. It supplies a large first diagonal branch and two moderate size septal perforator branches.  There is no high-grade obstructive disease throughout the course of the LAD, and there is only some minor lumen irregularity.  The left circumflex is also patent.  There is a moderate-sized first OM and a small second OM branch.   The circumflex has no significant obstructive disease.  Right coronary artery:  The RCA is a large and dominant vessel.  The vessel gives off a PDA and a posterolateral branch.  There is minor nonobstructive disease in the mid right coronary with no evidence of high-grade stenosis.  There is a 20-25% lesion in the midvessel.  FINAL ASSESSMENT: 1. Minimal nonobstructive coronary artery disease. 2. Normal left ventricular function.     Veverly Fells. Excell Seltzer, MD     MDC/MEDQ  D:  02/10/2011  T:  02/11/2011  Job:  [id number]  cc:   Luis Abed, MD, [facility] Arta Silence, MD Tereso Newcomer, PA-C  Electronically Signed by Tonny Bollman MD on 03/11/2011 12:19:56 AM

## 2011-03-11 NOTE — Discharge Summary (Signed)
NAMEMarland Kitchen  [facility], [place] NO.:  [id number]  MEDICAL RECORD NO.:  [id number]  LOCATION:  3729                         FACILITY:  [facility]  PHYSICIAN:  Veverly Fells. Excell Seltzer, MD  DATE OF BIRTH:  [birth year]  DATE OF ADMISSION:  02/09/2011 DATE OF DISCHARGE:  02/10/2011                              DISCHARGE SUMMARY   PRIMARY CARDIOLOGIST:  Luis Abed, MD, [facility]  PRIMARY CARE PROVIDER:  Arta Silence, MD  DISCHARGE DIAGNOSIS:  Chest pain without objective evidence of ischemia.  SECONDARY DIAGNOSES: 1. Nonobstructive coronary artery disease by catheterization this     admission. 2. Hypertension. 3. Hyperlipidemia. 4. Allergic rhinitis. 5. Anxiety. 6. Depression. 7. Diet-controlled type 2 diabetes mellitus. 8. History of sigmoid colon polyps. 9. Obstructive sleep apnea. 10.Benign prostatic hypertrophy. 11.History of paroxysmal supraventricular tachycardia. 12.Gastroesophageal reflux disease status post gastric fundoplication. 13.Status post hernia repair. 14.Status post cholecystectomy. 15.Status post appendectomy. 16.Status post back surgery. 17.History of carpal tunnel syndrome status post release. 18.History of ankle fracture.  ALLERGIES:  CODEINE and SULFA.  PROCEDURES:  Left heart cardiac catheterization performed on February 10, 2011, revealing a 20% stenosis in the mid-right coronary artery and otherwise normal coronary arteries.  The EF was 60% with normal wall motion.  HISTORY OF PRESENT ILLNESS:  A 60 year old male with multiple risk factors outlined above who was seen in clinic on February 09, 2011, with complaints of a 2-week history of persistent chest discomfort that is worse with exertion.  As the patient was having pain in the office, decision was made to admit him for further evaluation and catheterization.  HOSPITAL COURSE:  The patient ruled out for MI.  ECG remained nonacute. Diagnostic catheterization was performed this morning  revealing nonobstructive right coronary artery disease with otherwise normal coronary arteries and normal LV function.  The patient will be discharged home today in good condition.  DISCHARGE LABORATORY DATA:  Hemoglobin 12.4, hematocrit 36.3, WBC 7.9, platelets 227, INR 1.02.  Sodium 140, potassium 3.5, chloride 106, CO2 of 26, BUN 15, creatinine 0.70, glucose 128, total bilirubin 1.1, alkaline phosphatase 117, AST 26, ALT 42, total protein 5.9, albumin 3.3, calcium 8.1, CK 109, MB 2.0, troponin I less than 0.30.  Total cholesterol 146, triglycerides 72, HDL 47, LDL 85.  DISPOSITION:  The patient will be discharged home today in good condition.  FOLLOWUP PLANS AND APPOINTMENTS:  The patient will follow up with Tereso Newcomer, PA on January 30, 2011, at 10:30 a.m..  Follow up with Dr. Hetty Ely as previously scheduled.  DISCHARGE MEDICATIONS: 1. Aspirin 81 mg daily. 2. Allegra over the counter 1 tablet daily p.r.n. 3. Alprazolam 0.5 mg b.i.d. 4. Ambien 10 mg at bedtime. 5. Cyclobenzaprine 10 mg b.i.d. 6. Doxazosin 2 mg daily. 7. Finasteride 5 mg daily. 8. Fluoxetine 40 mg daily. 9. Hydrocodone/APAP 10/325 mg q.6 h. P.r.n. 10.Lisinopril 20 mg daily. 11.Meloxicam 15 mg daily. 12.Multivitamin 1 tablet daily. 13.Protonix 40 mg daily. 14.Potassium chloride 10 mEq daily. 15.ReQuip 1 mg at bedtime. 16.Simvastatin 20 mg daily. 17.Detrol LA 4 mg daily.  OUTSTANDING LABORATORY STUDIES:  None.  DURATION OF DISCHARGE ENCOUNTER:  40 minutes including physician time.     Nicolasa Ducking,  ANP   ______________________________ Veverly Fells. Excell Seltzer, MD    CB/MEDQ  D:  02/10/2011  T:  02/11/2011  Job:  [id number]  cc:   Arta Silence, MD  Electronically Signed by Nicolasa Ducking ANP on 02/12/2011 02:34:15 PM Electronically Signed by Tonny Bollman MD on 03/11/2011 12:19:50 AM

## 2011-03-17 ENCOUNTER — Ambulatory Visit (INDEPENDENT_AMBULATORY_CARE_PROVIDER_SITE_OTHER): Payer: Medicare Other | Admitting: Family Medicine

## 2011-03-17 ENCOUNTER — Encounter: Payer: Self-pay | Admitting: Family Medicine

## 2011-03-17 DIAGNOSIS — I10 Essential (primary) hypertension: Secondary | ICD-10-CM

## 2011-03-17 DIAGNOSIS — E119 Type 2 diabetes mellitus without complications: Secondary | ICD-10-CM

## 2011-03-17 NOTE — Assessment & Plan Note (Signed)
Good control. Cont curr meds. 

## 2011-03-17 NOTE — Patient Instructions (Signed)
RTC in 2 weeks with diary and foods eaten and exercise done.

## 2011-03-17 NOTE — Progress Notes (Signed)
  Subjective:    Patient ID: Mark Morris, male    DOB: 08/15/1951, 60 y.o.   MRN: [id number]  HPI Pt here for followup of diabetes. He is on no medications. He was to do four day progression diary. His nos are good at times and elevated at others. Highest 200+, lowest mid 70s. He does not remember foods eaten. He has no complaints today and thinks the diary was no real problem to do.   Review of Systems  Constitutional: Negative for fever, chills, diaphoresis, activity change, appetite change and fatigue.  HENT: Negative for hearing loss, ear pain, congestion, sore throat, rhinorrhea, neck pain, neck stiffness, postnasal drip, sinus pressure, tinnitus and ear discharge.   Eyes: Negative for pain, discharge and visual disturbance.  Respiratory: Negative for cough, shortness of breath and wheezing.   Cardiovascular: Negative for chest pain and palpitations.       No SOB w/ exertion  Gastrointestinal:       No heartburn or swallowing problems.  Genitourinary:       No nocturia  Skin:       No itching or dryness.  Neurological:       No tingling or balance problems.  All other systems reviewed and are negative.       Objective:   Physical Exam  Constitutional: He appears well-developed and well-nourished. No distress.  HENT:  Head: Normocephalic and atraumatic.  Right Ear: External ear normal.  Left Ear: External ear normal.  Nose: Nose normal.  Mouth/Throat: Oropharynx is clear and moist.  Eyes: Conjunctivae and EOM are normal. Pupils are equal, round, and reactive to light. Right eye exhibits no discharge. Left eye exhibits no discharge.  Neck: Normal range of motion. Neck supple.  Cardiovascular: Normal rate and regular rhythm.   Pulmonary/Chest: Effort normal and breath sounds normal. He has no wheezes.  Lymphadenopathy:    He has no cervical adenopathy.  Skin: He is not diaphoretic.          Assessment & Plan:

## 2011-03-17 NOTE — Assessment & Plan Note (Signed)
Nos vary. Will have him continue four day progression with addition of adding food eaten meal prior and any snacks when number over 120. Also annotate any exercise done. And when. Will still avoid medication until have better information and he has a better grasp of ideas of montoring and relation of exercise and diet.

## 2011-03-22 ENCOUNTER — Other Ambulatory Visit: Payer: Self-pay | Admitting: Family Medicine

## 2011-03-22 NOTE — Telephone Encounter (Signed)
Received refill request electronically from pharmacy. Is it okay to refill? Please verify dispense amount and number of refills. 

## 2011-04-01 ENCOUNTER — Ambulatory Visit (INDEPENDENT_AMBULATORY_CARE_PROVIDER_SITE_OTHER): Payer: Medicare Other | Admitting: Family Medicine

## 2011-04-01 ENCOUNTER — Encounter: Payer: Self-pay | Admitting: Family Medicine

## 2011-04-01 DIAGNOSIS — R1310 Dysphagia, unspecified: Secondary | ICD-10-CM

## 2011-04-01 DIAGNOSIS — E119 Type 2 diabetes mellitus without complications: Secondary | ICD-10-CM

## 2011-04-01 DIAGNOSIS — N3941 Urge incontinence: Secondary | ICD-10-CM

## 2011-04-01 DIAGNOSIS — M545 Low back pain: Secondary | ICD-10-CM

## 2011-04-01 MED ORDER — CYCLOBENZAPRINE HCL 10 MG PO TABS: 10.0000 mg | ORAL_TABLET | Freq: Three times a day (TID) | ORAL | Status: DC | PRN

## 2011-04-01 NOTE — Assessment & Plan Note (Signed)
Wants to stop Protonix due to money concerns. Will follow.

## 2011-04-01 NOTE — Assessment & Plan Note (Signed)
Nos anywhere from 80s to 90s at times to 160s at times. Discussed technique of trying to correlate food intake to no. Gotten and how to manipulate diet. He will continue. (He forgot his food diary today) Will see him back in one month now. The time yesterday at lunch his BS was 300+, he took vinegar and it came down to 166. I can't explain that.

## 2011-04-01 NOTE — Assessment & Plan Note (Signed)
Stopped Detrol due to finances. Will follow sxs.

## 2011-04-01 NOTE — Assessment & Plan Note (Signed)
Script for Flexeril sent in.

## 2011-04-01 NOTE — Progress Notes (Signed)
  Subjective:    Patient ID: Newman Nickels, male    DOB: 1950/10/11, 60 y.o.   MRN: [id number]  HPIPt is here for two week followup. He is diabetic and was sent home with need to do four day progression diary of sugars, food intake and exercise. He feels well and has no complaints. HE needs to stop Protonix and Detrol due to finances. He has no unusual sxs and has been on Protonix since his gastric bypass for weight control.  He needs more flexeril which he takes for back muscle spasms.    Review of Systems  Constitutional: Negative for fever, chills, diaphoresis, activity change, appetite change and fatigue.  HENT: Negative for hearing loss, ear pain, congestion, sore throat, rhinorrhea, neck pain, neck stiffness, postnasal drip, sinus pressure, tinnitus and ear discharge.   Eyes: Negative for pain, discharge and visual disturbance.  Respiratory: Negative for cough, shortness of breath and wheezing.   Cardiovascular: Negative for chest pain and palpitations.       No SOB w/ exertion  Gastrointestinal:       No heartburn or swallowing problems.  Genitourinary:       No nocturia  Skin:       No itching or dryness.  Neurological:       No tingling or balance problems.  All other systems reviewed and are negative.       Objective:   Physical Exam  Constitutional: He appears well-developed and well-nourished. No distress.  HENT:  Head: Normocephalic and atraumatic.  Right Ear: External ear normal.  Left Ear: External ear normal.  Nose: Nose normal.  Mouth/Throat: Oropharynx is clear and moist.  Eyes: Conjunctivae and EOM are normal. Pupils are equal, round, and reactive to light. Right eye exhibits no discharge. Left eye exhibits no discharge.  Neck: Normal range of motion. Neck supple.  Cardiovascular: Normal rate and regular rhythm.   Pulmonary/Chest: Effort normal and breath sounds normal. He has no wheezes.  Lymphadenopathy:    He has no cervical adenopathy.  Skin: He is  not diaphoretic.          Assessment & Plan:

## 2011-04-01 NOTE — Patient Instructions (Addendum)
RTC 1 month for recheck. Bring four day progression diary of nos with foods eaten to discuss. May take Zantac, Tagamet or Pepcid instead of Protonix if so desired. Discuss  cinnamon next time.

## 2011-04-20 ENCOUNTER — Other Ambulatory Visit: Payer: Self-pay | Admitting: Family Medicine

## 2011-04-20 NOTE — Telephone Encounter (Signed)
Received refill request electronically from pharmacy. Is it okay to refill medication? 

## 2011-04-20 NOTE — Telephone Encounter (Signed)
Rx called to pharmacy as instructed. 

## 2011-05-05 ENCOUNTER — Ambulatory Visit (INDEPENDENT_AMBULATORY_CARE_PROVIDER_SITE_OTHER): Payer: Medicare Other | Admitting: Family Medicine

## 2011-05-05 ENCOUNTER — Encounter: Payer: Self-pay | Admitting: Family Medicine

## 2011-05-05 DIAGNOSIS — E119 Type 2 diabetes mellitus without complications: Secondary | ICD-10-CM

## 2011-05-05 DIAGNOSIS — Z23 Encounter for immunization: Secondary | ICD-10-CM

## 2011-05-05 DIAGNOSIS — I10 Essential (primary) hypertension: Secondary | ICD-10-CM

## 2011-05-05 MED ORDER — METFORMIN HCL 500 MG PO TABS: 500.0000 mg | ORAL_TABLET | Freq: Two times a day (BID) | ORAL | Status: DC

## 2011-05-05 NOTE — Progress Notes (Signed)
Addended by: Sydell Axon C on: 05/05/2011 12:23 PM   Modules accepted: Orders

## 2011-05-05 NOTE — Patient Instructions (Addendum)
RTC 2 mos, A1C prior 250.00 Pneumovax and Flu shot today.

## 2011-05-05 NOTE — Assessment & Plan Note (Signed)
Control not awful but needs attention. Will start Metformin at low dose, 500mg  brfst and supper. Again discussed four day progression, labelling when done and write down what eaten if numbet over 140.  Will discuss next time.

## 2011-05-05 NOTE — Progress Notes (Signed)
  Subjective:    Patient ID: Mark Morris, male    DOB: 1951/08/08, 60 y.o.   MRN: [id number]  HPI Pt here for one month followup. He is stressed out from his brother in law dying from cancer. He is stressed out from his brother in law currently dying of cancer. He has a diabetic diary with him which he says is in four day progression but nothing is marked by time. About half or less are over 140, some as high as 220.  He has no complaints today other than the stress.  He would like a pneumonia shot. He has had pneumonia in the past. He has risk.    Review of SystemsNoncontributory except as above.       Objective:   Physical Exam  Constitutional: He is oriented to person, place, and time. He appears well-developed and well-nourished. No distress.  HENT:  Head: Normocephalic and atraumatic.  Right Ear: External ear normal.  Left Ear: External ear normal.  Nose: Nose normal.  Mouth/Throat: Oropharynx is clear and moist.  Eyes: Conjunctivae and EOM are normal. Pupils are equal, round, and reactive to light. Right eye exhibits no discharge. Left eye exhibits no discharge. No scleral icterus.  Neck: Normal range of motion. Neck supple. No thyromegaly present.  Cardiovascular: Normal rate, regular rhythm, normal heart sounds and intact distal pulses.   No murmur heard. Pulmonary/Chest: Effort normal and breath sounds normal. No respiratory distress. He has no wheezes.  Abdominal: Soft. Bowel sounds are normal. He exhibits no distension and no mass. There is no tenderness. There is no rebound and no guarding.  Genitourinary: Rectum normal, prostate normal and penis normal. Guaiac negative stool.  Musculoskeletal: Normal range of motion. He exhibits no edema.  Lymphadenopathy:    He has no cervical adenopathy.  Neurological: He is alert and oriented to person, place, and time. Coordination normal.  Skin: Skin is warm and dry. No rash noted. He is not diaphoretic.  Psychiatric: He has a  normal mood and affect. His behavior is normal. Judgment and thought content normal.          Assessment & Plan:

## 2011-05-05 NOTE — Assessment & Plan Note (Signed)
Slightly elevated but has been ok in the past. Will follow. Change/treat next time if needed. BP Readings from Last 3 Encounters:  05/05/11 140/84  04/01/11 132/82  03/17/11 124/70

## 2011-05-09 ENCOUNTER — Other Ambulatory Visit: Payer: Self-pay | Admitting: Family Medicine

## 2011-05-11 ENCOUNTER — Other Ambulatory Visit: Payer: Self-pay | Admitting: Family Medicine

## 2011-05-13 ENCOUNTER — Other Ambulatory Visit: Payer: Self-pay | Admitting: Family Medicine

## 2011-05-20 ENCOUNTER — Other Ambulatory Visit: Payer: Self-pay | Admitting: Family Medicine

## 2011-05-20 NOTE — Telephone Encounter (Signed)
Received refill request electronically from pharmacy. Is it okay to refill medication? 

## 2011-06-29 ENCOUNTER — Other Ambulatory Visit: Payer: Self-pay | Admitting: Family Medicine

## 2011-06-29 DIAGNOSIS — E119 Type 2 diabetes mellitus without complications: Secondary | ICD-10-CM

## 2011-07-01 ENCOUNTER — Other Ambulatory Visit (INDEPENDENT_AMBULATORY_CARE_PROVIDER_SITE_OTHER): Payer: Medicare Other

## 2011-07-01 DIAGNOSIS — E119 Type 2 diabetes mellitus without complications: Secondary | ICD-10-CM

## 2011-07-01 NOTE — Progress Notes (Signed)
Addended by: Baldomero Lamy on: 07/01/2011 09:21 AM   Modules accepted: Orders

## 2011-07-08 ENCOUNTER — Encounter: Payer: Self-pay | Admitting: Family Medicine

## 2011-07-08 ENCOUNTER — Ambulatory Visit (INDEPENDENT_AMBULATORY_CARE_PROVIDER_SITE_OTHER): Payer: Medicare Other | Admitting: Family Medicine

## 2011-07-08 DIAGNOSIS — F17200 Nicotine dependence, unspecified, uncomplicated: Secondary | ICD-10-CM

## 2011-07-08 DIAGNOSIS — E119 Type 2 diabetes mellitus without complications: Secondary | ICD-10-CM

## 2011-07-08 DIAGNOSIS — I10 Essential (primary) hypertension: Secondary | ICD-10-CM

## 2011-07-08 NOTE — Assessment & Plan Note (Signed)
A1C 6.0, nos on his machine agree with this. He has lost weight since being seen last time as well and things appear as if he has great control of his diabetes at this point on his current diet and exercise and medication. The challenge is to continue this lifestyle! Was encouraged to do so. He would like to delay his PE.

## 2011-07-08 NOTE — Patient Instructions (Signed)
Cancel PE next month.  Pls schedule PE with Dr Para March as new pt in Mar or Apr, labs prior.

## 2011-07-08 NOTE — Assessment & Plan Note (Signed)
Still off cigarettes and was encouraged not to go back.

## 2011-07-08 NOTE — Assessment & Plan Note (Signed)
Obviously in a better place today and his BP reflects this. Cont curr meds. [place] Mobic sparingly.

## 2011-07-08 NOTE — Progress Notes (Signed)
  Subjective:    Patient ID: Mark Morris, male    DOB: 09-29-1950, 60 y.o.   MRN: [id number]  HPI Pt here for two month followup. He was started on Metformin last time with brfst and supper. His BP had been minimally high then as well.  He does not have his diary with him. He has tolerated the Metformin fine. He is on a diet currently, his wife is diabetic also, and sees Dr Shelda Altes, Endocrinologist at [facility]. She is having a hard time. His BP today is great. He looks good and appears well rested and comfortable.   Review of Systems  Constitutional: Negative for fever, chills, diaphoresis, activity change, appetite change and fatigue.  HENT: Negative for hearing loss, ear pain, congestion, sore throat, rhinorrhea, neck pain, neck stiffness, postnasal drip, sinus pressure, tinnitus and ear discharge.   Eyes: Negative for pain, discharge and visual disturbance.  Respiratory: Negative for cough, shortness of breath and wheezing.   Cardiovascular: Negative for chest pain and palpitations.       No SOB w/ exertion  Gastrointestinal:       No heartburn or swallowing problems.  Genitourinary:       No nocturia  Skin:       No itching or dryness.  Neurological:       No tingling or balance problems.  All other systems reviewed and are negative.       Objective:   Physical Exam  Constitutional: He appears well-developed and well-nourished. No distress.  HENT:  Head: Normocephalic and atraumatic.  Right Ear: External ear normal.  Left Ear: External ear normal.  Nose: Nose normal.  Mouth/Throat: Oropharynx is clear and moist.  Eyes: Conjunctivae and EOM are normal. Pupils are equal, round, and reactive to light. Right eye exhibits no discharge. Left eye exhibits no discharge.  Neck: Normal range of motion. Neck supple.  Cardiovascular: Normal rate and regular rhythm.   Pulmonary/Chest: Effort normal and breath sounds normal. He has no wheezes.  Lymphadenopathy:    He has no cervical  adenopathy.  Skin: He is not diaphoretic.          Assessment & Plan:

## 2011-07-21 ENCOUNTER — Other Ambulatory Visit: Payer: Self-pay | Admitting: Family Medicine

## 2011-07-21 NOTE — Telephone Encounter (Signed)
Received refill request electronically from pharmacy. Is it okay to refill medication? 

## 2011-07-23 ENCOUNTER — Other Ambulatory Visit: Payer: Self-pay | Admitting: Family Medicine

## 2011-07-26 ENCOUNTER — Other Ambulatory Visit: Payer: Self-pay | Admitting: *Deleted

## 2011-07-26 MED ORDER — ALPRAZOLAM 0.5 MG PO TABS: 0.5000 mg | ORAL_TABLET | Freq: Two times a day (BID) | ORAL | Status: DC | PRN

## 2011-07-26 NOTE — Telephone Encounter (Signed)
Received faxed refill request from pharmacy. Is it okay to refill medication? 

## 2011-07-26 NOTE — Telephone Encounter (Signed)
Rx called to pharmacy

## 2011-07-27 ENCOUNTER — Other Ambulatory Visit: Payer: Self-pay | Admitting: *Deleted

## 2011-07-27 NOTE — Telephone Encounter (Signed)
Patient called stating that he needs the amount of Aprazolam changed to #60. Patient states that he has been taking two a day for years. Pharmacy [facility]

## 2011-07-28 MED ORDER — ALPRAZOLAM 0.5 MG PO TABS: 0.5000 mg | ORAL_TABLET | Freq: Two times a day (BID) | ORAL | Status: DC | PRN

## 2011-07-28 NOTE — Telephone Encounter (Signed)
Rx called to pharmacy. Left message for patient to call back. 

## 2011-07-28 NOTE — Telephone Encounter (Signed)
Patient advised that rx was called to pharmacy with #60.

## 2011-08-02 ENCOUNTER — Other Ambulatory Visit: Payer: Medicare Other

## 2011-08-05 ENCOUNTER — Ambulatory Visit (INDEPENDENT_AMBULATORY_CARE_PROVIDER_SITE_OTHER): Payer: Medicare Other | Admitting: Family Medicine

## 2011-08-05 ENCOUNTER — Other Ambulatory Visit: Payer: Medicare Other

## 2011-08-05 ENCOUNTER — Encounter: Payer: Medicare Other | Admitting: Family Medicine

## 2011-08-05 ENCOUNTER — Encounter: Payer: Self-pay | Admitting: Family Medicine

## 2011-08-05 VITALS — BP 114/76 | HR 88 | Temp 98.2°F | Wt 238.2 lb

## 2011-08-05 DIAGNOSIS — IMO0002 Reserved for concepts with insufficient information to code with codable children: Secondary | ICD-10-CM | POA: Insufficient documentation

## 2011-08-05 DIAGNOSIS — L02818 Cutaneous abscess of other sites: Secondary | ICD-10-CM

## 2011-08-05 NOTE — Progress Notes (Signed)
  Subjective:    Patient ID: Mark Morris, male    DOB: Nov 12, 1950, 60 y.o.   MRN: [id number]  HPI Pt here as acute walkin at his wife's appt for having a painful knot on the back of his head that he has just noticed. He denies fever or chills. He has had a small cyst below this area for quite some time but felt this area of tenderness last night which grew quickly overnight. It is not warm or overly erythematous but is fluctulant and tender to touch.   Review of SystemsNo changes. Noncontributory.     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 Appears to be mildly deep abscess.       Assessment & Plan:

## 2011-08-05 NOTE — Patient Instructions (Signed)
Go to Dr Luan Moore office now for presumed I&D.

## 2011-08-05 NOTE — Assessment & Plan Note (Signed)
Will refer to Dr Evette Cristal who will see him as soon as he can get there. I spoke with him personally and described the case.

## 2011-08-10 ENCOUNTER — Other Ambulatory Visit: Payer: Self-pay | Admitting: Family Medicine

## 2011-08-10 NOTE — Telephone Encounter (Signed)
Received refill request electronically from pharmacy. Is it okay to refill medication? 

## 2011-08-24 DIAGNOSIS — I499 Cardiac arrhythmia, unspecified: Secondary | ICD-10-CM

## 2011-08-24 HISTORY — DX: Cardiac arrhythmia, unspecified: I49.9

## 2011-08-28 ENCOUNTER — Other Ambulatory Visit: Payer: Self-pay | Admitting: Family Medicine

## 2011-09-23 ENCOUNTER — Other Ambulatory Visit: Payer: Self-pay | Admitting: Family Medicine

## 2011-09-24 NOTE — Telephone Encounter (Signed)
Medication phoned to pharmacy.  

## 2011-09-24 NOTE — Telephone Encounter (Signed)
Please call in

## 2011-10-13 ENCOUNTER — Other Ambulatory Visit: Payer: Self-pay | Admitting: Family Medicine

## 2011-10-15 NOTE — Telephone Encounter (Signed)
Received fax refill request from patient's pharmacy.  Prescription refilled. 

## 2011-10-19 ENCOUNTER — Other Ambulatory Visit: Payer: Self-pay | Admitting: Family Medicine

## 2011-10-20 ENCOUNTER — Other Ambulatory Visit: Payer: Self-pay | Admitting: *Deleted

## 2011-10-20 MED ORDER — DOXAZOSIN MESYLATE 2 MG PO TABS: 2.0000 mg | ORAL_TABLET | Freq: Every day | ORAL | Status: DC

## 2011-10-20 MED ORDER — FINASTERIDE 5 MG PO TABS: 5.0000 mg | ORAL_TABLET | Freq: Every day | ORAL | Status: DC

## 2011-10-20 NOTE — Telephone Encounter (Signed)
Sent!

## 2011-10-21 ENCOUNTER — Other Ambulatory Visit: Payer: Self-pay | Admitting: Family Medicine

## 2011-10-21 NOTE — Telephone Encounter (Signed)
To: [facility] (Daytime Triage) Fax: [phone] From: [facility] Date/ Time: 10/21/2011 11:09 AM Taken By: Di Kindle, RN Caller: Melvyn Neth Facility: not collected Patient: Mark Morris, Mark Morris DOB: [birth year] Phone: [phone] Reason for Call: Pt returning call to [facility] in the office, please call [phone]. Regarding Appointment: Appt Date: Appt Time: Unknown Provider: Reason: Details: Outcome:

## 2011-10-21 NOTE — Telephone Encounter (Signed)
Electronic refill request

## 2011-10-21 NOTE — Telephone Encounter (Signed)
Patient called to let Dr. Para March know that he received some paper work in the mail regarding a back brace.  He doesn't remember the name of the company and he doesn't have the paper work but he stated that they will be sending information to our office for Dr. Para March to sign because he is interested in getting this brace.  I looked in the patient's chart to see if anything has been scanned but couldn't find anything.  Please advise.

## 2011-10-22 NOTE — Telephone Encounter (Signed)
I'll await the form about the brace.  rx sent.

## 2011-10-28 ENCOUNTER — Other Ambulatory Visit: Payer: Self-pay | Admitting: Family Medicine

## 2011-11-02 ENCOUNTER — Other Ambulatory Visit: Payer: Self-pay | Admitting: Family Medicine

## 2011-11-02 DIAGNOSIS — N400 Enlarged prostate without lower urinary tract symptoms: Secondary | ICD-10-CM

## 2011-11-02 DIAGNOSIS — E119 Type 2 diabetes mellitus without complications: Secondary | ICD-10-CM

## 2011-11-04 ENCOUNTER — Other Ambulatory Visit (INDEPENDENT_AMBULATORY_CARE_PROVIDER_SITE_OTHER): Payer: Medicare Other

## 2011-11-04 DIAGNOSIS — E119 Type 2 diabetes mellitus without complications: Secondary | ICD-10-CM

## 2011-11-04 DIAGNOSIS — N400 Enlarged prostate without lower urinary tract symptoms: Secondary | ICD-10-CM

## 2011-11-04 LAB — LIPID PANEL
HDL: 48.1 mg/dL (ref >39.00)
Total CHOL/HDL Ratio: 3
Triglycerides: 99 mg/dL (ref 0.0–149.0)

## 2011-11-04 LAB — COMPREHENSIVE METABOLIC PANEL
ALT: 42 U/L (ref 0–53)
CO2: 28 mEq/L (ref 19–32)
Creatinine, Ser: 1 mg/dL (ref 0.4–1.5)
GFR: 80.48 mL/min (ref >60.00)
Total Bilirubin: 0.8 mg/dL (ref 0.3–1.2)

## 2011-11-04 LAB — HEMOGLOBIN A1C: Hgb A1c MFr Bld: 6 % (ref 4.6–6.5)

## 2011-11-11 ENCOUNTER — Encounter: Payer: Self-pay | Admitting: Family Medicine

## 2011-11-11 ENCOUNTER — Ambulatory Visit (INDEPENDENT_AMBULATORY_CARE_PROVIDER_SITE_OTHER): Payer: Medicare Other | Admitting: Family Medicine

## 2011-11-11 VITALS — BP 122/78 | HR 84 | Temp 97.8°F | Ht 70.5 in | Wt 231.8 lb

## 2011-11-11 DIAGNOSIS — F329 Major depressive disorder, single episode, unspecified: Secondary | ICD-10-CM

## 2011-11-11 DIAGNOSIS — E119 Type 2 diabetes mellitus without complications: Secondary | ICD-10-CM

## 2011-11-11 DIAGNOSIS — N529 Male erectile dysfunction, unspecified: Secondary | ICD-10-CM

## 2011-11-11 DIAGNOSIS — N401 Enlarged prostate with lower urinary tract symptoms: Secondary | ICD-10-CM

## 2011-11-11 DIAGNOSIS — E785 Hyperlipidemia, unspecified: Secondary | ICD-10-CM

## 2011-11-11 DIAGNOSIS — I10 Essential (primary) hypertension: Secondary | ICD-10-CM

## 2011-11-11 DIAGNOSIS — F17200 Nicotine dependence, unspecified, uncomplicated: Secondary | ICD-10-CM

## 2011-11-11 MED ORDER — FLUOXETINE HCL 20 MG PO TABS: 20.0000 mg | ORAL_TABLET | Freq: Every day | ORAL | Status: DC

## 2011-11-11 MED ORDER — METFORMIN HCL 500 MG PO TABS: 500.0000 mg | ORAL_TABLET | Freq: Every day | ORAL | Status: DC

## 2011-11-11 MED ORDER — SILDENAFIL CITRATE 100 MG PO TABS: 50.0000 mg | ORAL_TABLET | Freq: Every day | ORAL | Status: AC | PRN

## 2011-11-11 NOTE — Progress Notes (Signed)
ED.  Dec in function, no AM erections.  Going on for months.  Gradually go worse.  No dysuria.  No abd pain. Stream is okay.  H/o BPH.  Smoking and h/o DM2.  Never treated for ED.   H/o depression, No SI/HI.  On ssri.  We discussed possible contribution to ED.  Diabetes:  Using medications without difficulties: yes, taking metformin Hypoglycemic episodes: yes, as low as 45 Hyperglycemic episodes: yes, up to 180s fasting, up to 300 o/w Feet problems: no Blood Sugars averaging: as above, variable  Hypertension:    Using medication without problems or lightheadedness: yes Chest pain with exertion:no Edema:no Short of breath:at baseline from smoking  Elevated Cholesterol: Using medications without problems: yes Muscle aches: no Diet compliance: we discussed, "I've been working on it."  No sig weight change.   Exercise: none, other than house work  PMH and SH reviewed.   Vital signs, Meds and allergies reviewed.  ROS: See HPI.  Otherwise nontributory.   GEN: nad, alert and oriented HEENT: mucous membranes moist NECK: supple w/o LA CV: rrr.  no murmur PULM: ctab, no inc wob ABD: soft, +bs EXT: no edema SKIN: no acute rash  Diabetic foot exam: Normal inspection No skin breakdown No calluses  Normal DP pulses Normal sensation to light tough and monofilament Nails normal

## 2011-11-11 NOTE — Patient Instructions (Signed)
Take 1 metformin at breakfast.  Cut back to 20mg  of prozac a day and see if that helps with sexual function. If may take a month to make a difference.  If your mood gets worse, go back to the 40mg  dose.  Take the viagra if needed (but only if the prozac change doesn't help). Recheck A1c in 3 months and come see me a few days after that.  Take care.

## 2011-11-12 DIAGNOSIS — N529 Male erectile dysfunction, unspecified: Secondary | ICD-10-CM | POA: Insufficient documentation

## 2011-11-12 NOTE — Assessment & Plan Note (Signed)
psa okay.

## 2011-11-12 NOTE — Assessment & Plan Note (Signed)
Dec metformin and recheck later in 2013.  D/w pt about weight and diet.

## 2011-11-12 NOTE — Assessment & Plan Note (Signed)
Cut back to 20mg  prozac and notify me if mood worsens.  See if that helps ED.  He agrees.

## 2011-11-12 NOTE — Assessment & Plan Note (Signed)
Continue meds, needs to lose weight.  

## 2011-11-12 NOTE — Assessment & Plan Note (Signed)
Continue meds, needs to lose weight.

## 2011-11-12 NOTE — Assessment & Plan Note (Signed)
Dec SSRI, then start viagra if not improved.  Needs to stop smoking, discussed.

## 2011-11-12 NOTE — Assessment & Plan Note (Signed)
D/w pt about cessation.  

## 2011-12-17 ENCOUNTER — Other Ambulatory Visit: Payer: Self-pay | Admitting: Family Medicine

## 2011-12-17 NOTE — Telephone Encounter (Signed)
Electronic refill request

## 2011-12-17 NOTE — Telephone Encounter (Signed)
Sent!

## 2012-01-14 ENCOUNTER — Other Ambulatory Visit: Payer: Self-pay | Admitting: Family Medicine

## 2012-01-21 ENCOUNTER — Other Ambulatory Visit: Payer: Self-pay | Admitting: Family Medicine

## 2012-01-21 NOTE — Telephone Encounter (Signed)
Sent!

## 2012-01-21 NOTE — Telephone Encounter (Signed)
Electronic refill request

## 2012-02-14 ENCOUNTER — Other Ambulatory Visit (INDEPENDENT_AMBULATORY_CARE_PROVIDER_SITE_OTHER): Payer: Medicare Other

## 2012-02-14 DIAGNOSIS — E119 Type 2 diabetes mellitus without complications: Secondary | ICD-10-CM

## 2012-02-17 ENCOUNTER — Other Ambulatory Visit: Payer: Self-pay | Admitting: *Deleted

## 2012-02-17 ENCOUNTER — Other Ambulatory Visit: Payer: Self-pay | Admitting: Family Medicine

## 2012-02-17 MED ORDER — ALPRAZOLAM 0.5 MG PO TABS: 0.5000 mg | ORAL_TABLET | Freq: Two times a day (BID) | ORAL | Status: DC | PRN

## 2012-02-17 MED ORDER — ZOLPIDEM TARTRATE 10 MG PO TABS: 10.0000 mg | ORAL_TABLET | Freq: Every evening | ORAL | Status: DC | PRN

## 2012-02-17 NOTE — Telephone Encounter (Signed)
Please call in

## 2012-02-18 NOTE — Telephone Encounter (Signed)
Medication phoned to pharmacy.  

## 2012-02-22 ENCOUNTER — Ambulatory Visit (INDEPENDENT_AMBULATORY_CARE_PROVIDER_SITE_OTHER): Payer: Medicare Other | Admitting: Family Medicine

## 2012-02-22 ENCOUNTER — Encounter: Payer: Self-pay | Admitting: Family Medicine

## 2012-02-22 VITALS — BP 132/84 | HR 98 | Temp 97.9°F | Wt 233.0 lb

## 2012-02-22 DIAGNOSIS — F411 Generalized anxiety disorder: Secondary | ICD-10-CM

## 2012-02-22 DIAGNOSIS — E119 Type 2 diabetes mellitus without complications: Secondary | ICD-10-CM

## 2012-02-22 NOTE — Patient Instructions (Addendum)
Recheck A1c in 4 months before a visit with Para March.  Take care.  Don't change your meds for now.

## 2012-02-22 NOTE — Assessment & Plan Note (Signed)
Controlled, A1c at goal.  Continue as is.  He's doing well with current meds, doses.  Recheck before OV in 4 months.

## 2012-02-22 NOTE — Assessment & Plan Note (Signed)
Controlled with meds, continue as is.

## 2012-02-22 NOTE — Progress Notes (Signed)
Patient has no medications with him or a meds list.  States nothing has really changed.  Ninetta Lights, CMA  D/w pt about bringing a med list.    Wife continue to have trouble with DM2 control and falls.  Currently inpatient. Asking for second opinion re DM2.  See note in her chart.  He's had a lot of stress with caring for her and didn't stop the prozac.  "I needed to continue it."  Diabetes:  Using medications without difficulties: yes Hypoglycemic episodes:no Hyperglycemic episodes:no Feet problems:no Blood Sugars averaging: ~110 He's working on diet, cutting out carbs.   This is difficult with church functions, but he's working on DM2 diet.    Meds, vitals, and allergies reviewed.   ROS: See HPI.  Otherwise negative.    GEN: nad, alert and oriented HEENT: mucous membranes moist NECK: supple w/o LA CV: rrr. PULM: ctab, no inc wob ABD: soft, +bs EXT: no edema SKIN: no acute rash  Diabetic foot exam: Normal inspection No skin breakdown No calluses  Normal DP pulses Normal sensation to light touch and monofilament Nails normal

## 2012-02-25 ENCOUNTER — Other Ambulatory Visit: Payer: Self-pay | Admitting: Family Medicine

## 2012-02-25 NOTE — Telephone Encounter (Signed)
Sent!

## 2012-03-04 ENCOUNTER — Encounter: Payer: Self-pay | Admitting: Cardiology

## 2012-03-04 DIAGNOSIS — Z720 Tobacco use: Secondary | ICD-10-CM | POA: Insufficient documentation

## 2012-03-04 DIAGNOSIS — I2510 Atherosclerotic heart disease of native coronary artery without angina pectoris: Secondary | ICD-10-CM | POA: Insufficient documentation

## 2012-03-08 ENCOUNTER — Ambulatory Visit (INDEPENDENT_AMBULATORY_CARE_PROVIDER_SITE_OTHER): Payer: Medicare Other | Admitting: Cardiology

## 2012-03-08 ENCOUNTER — Encounter: Payer: Self-pay | Admitting: Cardiology

## 2012-03-08 VITALS — BP 130/78 | HR 78 | Ht 71.0 in | Wt 233.0 lb

## 2012-03-08 DIAGNOSIS — I2510 Atherosclerotic heart disease of native coronary artery without angina pectoris: Secondary | ICD-10-CM

## 2012-03-08 NOTE — Progress Notes (Signed)
HPI Patient is stable. In general he feels fatigued. However he is not having significant shortness of breath or chest pain. He has not had syncope or presyncope.  Allergies  Allergen Reactions  . Codeine   . Sulfonamide Derivatives     Current Outpatient Prescriptions  Medication Sig Dispense Refill  . ALPRAZolam (XANAX) 0.5 MG tablet Take 1 tablet (0.5 mg total) by mouth 2 (two) times daily as needed for sleep.  60 tablet  2  . BAYER CONTOUR TEST test strip USE AS DIRECTED TO TEST BLOOD SUGAR ONCE DAILY  100 each  6  . calcium elemental as carbonate (CALCIUM ANTACID ULTRA) 400 MG tablet Chew 1,000 mg by mouth daily.        . cyclobenzaprine (FLEXERIL) 10 MG tablet TAKE ONE TABLET BY MOUTH THREE TIMES DAILY AS NEEDED FOR MUSCLE SPASM  60 tablet  0  . doxazosin (CARDURA) 2 MG tablet Take 1 tablet (2 mg total) by mouth daily.  90 tablet  3  . fexofenadine (ALLEGRA) 180 MG tablet Take 180 mg by mouth daily.        . finasteride (PROSCAR) 5 MG tablet Take 1 tablet (5 mg total) by mouth daily.  90 tablet  3  . FLUoxetine (PROZAC) 40 MG capsule Take 40 mg by mouth daily.      Marland Kitchen guaifenesin (HUMIBID E) 400 MG TABS Take 400 mg by mouth every 6 (six) hours as needed.        Marland Kitchen KLOR-CON 10 10 MEQ tablet TAKE ONE TABLET BY MOUTH EVERY DAY  90 each  1  . lisinopril (PRINIVIL,ZESTRIL) 20 MG tablet TAKE 1 TABLET BY MOUTH ONCE A DAY  90 tablet  2  . meloxicam (MOBIC) 15 MG tablet TAKE ONE TABLET BY MOUTH EVERY DAY WITH FOOD  30 tablet  5  . metFORMIN (GLUCOPHAGE) 500 MG tablet Take 500 mg by mouth 2 (two) times daily with a meal.      . MICROLET LANCETS MISC USE AS DIRECTED TO TEST BLOOD SUGAR ONCE DAILY  100 each  4  . Multiple Vitamin (MULTIVITAMIN) tablet Take 1 tablet by mouth daily.        Marland Kitchen rOPINIRole (REQUIP) 1 MG tablet TAKE  ONE TABLET BY MOUTH NIGHTLY AT BEDTIME  30 tablet  11  . simvastatin (ZOCOR) 20 MG tablet TAKE ONE TABLET BY MOUTH EVERY DAY  90 tablet  2  . zolpidem (AMBIEN) 10 MG  tablet Take 1 tablet (10 mg total) by mouth at bedtime as needed for sleep.  30 tablet  2  . DISCONTD: FLUoxetine (PROZAC) 20 MG tablet Take 1 tablet (20 mg total) by mouth daily.  30 tablet  3    History   Social History  . Marital Status: Married    Spouse Name: N/A    Number of Children: N/A  . Years of Education: N/A   Occupational History  . Not on file.   Social History Main Topics  . Smoking status: Current Everyday Smoker -- 0.5 packs/day for 40 years  . Smokeless tobacco: Never Used  . Alcohol Use: No  . Drug Use: No  . Sexually Active: Yes     Rarely   Other Topics Concern  . 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 Heart disease Mother 20    CABG  DM; Died due to Staph infection of sterotomy incision  . Diabetes Mother   . Hypertension Father   . Heart disease Father 72    pneumonia/CHF  . Alcohol abuse Brother 51    Has stopped at present  . Alcohol abuse Brother     Past Medical History  Diagnosis Date  . Hyperlipidemia 08/23/1997  . Allergy 2002    allergic rhinitis  . Hypertension 1980  . Anxiety 08/24/1999  . Depression 08/24/1999  . Diabetes mellitus 08/24/2003    typeII  . Enteritis 10/18/2004    CT abd. Enteritis//CT pelvis Sacroiliac Spurring  . Sigmoid polyp 07/03/2007    colonoscopy Small internal hemorrhoids; Sigmoid polyp B9 (Dr. Elliott)//colonoscopy normal 04/06/2004  . CAD (coronary artery disease)     Nonobstructive by cardiac catheterization 5/04: Proximal RCA 25%, EF 65%;  Myoview 12/05: no scar or ischemia, EF 58%  . Sleep apnea   . BPH (benign prostatic hyperplasia)   . Paroxysmal SVT (supraventricular tachycardia)     History of  . Tobacco abuse     Past Surgical History  Procedure Date  . Cardiac catheterization 01/14/2003    Min plaque EF65%// Cath normal (Dr. Juliann Pares  2000)//ECHO Mild AAA TrMR Ef61% 05/30/2002 (Dr.Callwood)  . Gastric fundoplication [facility]  . Hernia repair 01/02/2006    right ventral Hernia Repair Laparascopic Leretha Pol  . Gastric bypass 11/24/2004  . Sleep study 03/2004    Sleep Study Hypopneas 60/hr ; Kicks 160/hr  . Ankle fracture surgery 02/2003  . Carpal tunnel release   . Back surgery   . Cholecystectomy   . Appendectomy     ROS  Patient denies fever, chills, headache, sweats, rash, change in vision, change in hearing, chest pain, cough, nausea vomiting, urinary symptoms. All other systems are reviewed and are negative.  PHYSICAL EXAM Patient is oriented to person time and place. Affect is normal. Lungs are clear. Respiratory effort is nonlabored. Cardiac exam reveals S1 and S2. There no clicks or significant murmurs. The abdomen is soft. There is no peripheral edema.  Filed Vitals:   03/08/12 0901  BP: 130/78  Pulse: 78  Height: 5\' 11"  (1.803 m)  Weight: 233 lb (105.688 kg)  SpO2: 96%   EKG is done today and reviewed by me. It is normal. There is no change.  ASSESSMENT & PLAN

## 2012-03-08 NOTE — Assessment & Plan Note (Signed)
Coronary disease is stable. I think that his fatigue is not cardiac in origin. I am recommending no further cardiac workup. I'll plan to see him back in one year.

## 2012-03-08 NOTE — Patient Instructions (Addendum)
Your physician wants you to follow-up in: 1 year. You will receive a reminder letter in the mail two months in advance. If you don't receive a letter, please call our office to schedule the follow-up appointment.  

## 2012-04-15 ENCOUNTER — Other Ambulatory Visit: Payer: Self-pay | Admitting: Family Medicine

## 2012-04-20 ENCOUNTER — Other Ambulatory Visit: Payer: Self-pay | Admitting: *Deleted

## 2012-04-20 MED ORDER — ZOLPIDEM TARTRATE 10 MG PO TABS: 10.0000 mg | ORAL_TABLET | Freq: Every evening | ORAL | Status: DC | PRN

## 2012-04-20 NOTE — Telephone Encounter (Signed)
Faxed refill request   

## 2012-04-20 NOTE — Telephone Encounter (Signed)
Please call in

## 2012-04-21 NOTE — Telephone Encounter (Signed)
Medication phoned to pharmacy.  

## 2012-05-10 ENCOUNTER — Other Ambulatory Visit: Payer: Self-pay | Admitting: Family Medicine

## 2012-05-10 NOTE — Telephone Encounter (Signed)
Received refill request electronically from pharmacy. Last office visit 02/22/12. Is it okay to refill medication?

## 2012-05-10 NOTE — Telephone Encounter (Signed)
Sent!

## 2012-05-12 ENCOUNTER — Other Ambulatory Visit: Payer: Self-pay | Admitting: *Deleted

## 2012-05-12 NOTE — Telephone Encounter (Signed)
Faxed refill request.  Last filled 04/15/12.

## 2012-05-14 MED ORDER — ALPRAZOLAM 0.5 MG PO TABS: 0.5000 mg | ORAL_TABLET | Freq: Two times a day (BID) | ORAL | Status: DC | PRN

## 2012-05-14 NOTE — Telephone Encounter (Signed)
Please call in.  Thanks.   

## 2012-05-15 NOTE — Telephone Encounter (Signed)
Medication phoned to pharmacy.  

## 2012-05-19 ENCOUNTER — Encounter: Payer: Self-pay | Admitting: Family Medicine

## 2012-05-19 ENCOUNTER — Ambulatory Visit (INDEPENDENT_AMBULATORY_CARE_PROVIDER_SITE_OTHER): Payer: Medicare Other | Admitting: Family Medicine

## 2012-05-19 VITALS — BP 142/70 | HR 99 | Temp 97.9°F | Wt 226.8 lb

## 2012-05-19 DIAGNOSIS — G4700 Insomnia, unspecified: Secondary | ICD-10-CM

## 2012-05-19 DIAGNOSIS — J329 Chronic sinusitis, unspecified: Secondary | ICD-10-CM

## 2012-05-19 MED ORDER — AMOXICILLIN 875 MG PO TABS: 875.0000 mg | ORAL_TABLET | Freq: Two times a day (BID) | ORAL | Status: AC

## 2012-05-19 NOTE — Patient Instructions (Addendum)
Start the antibiotics today.  Drink plenty of fluids, take tylenol as needed, and this should gradually improve.  Take care.  Let us know if you have other concerns.

## 2012-05-19 NOTE — Progress Notes (Signed)
Sick this past week.   Voice is altered.  Chest and facial congestion.  No fevers known, but felt hot.  Green sputum and rhinorrhea.  Not improved, is worse in last few days.  No help with otc meds.  No ear pain but stuffy.  Hearing is altered.  ST initially, some better now.  No rash, diarrhea, vomiting. Sugar controlled on recent checks.   Smoking cessation discussed. Hasn't quit yet.    Asking about refill on ambien. Using for insomnia. Longstanding. Using CPAP for OSA.  Compliant until recent URI, can't use CPAP with nasal congestion.  Refill already sent. D/w pt.    Meds, vitals, and allergies reviewed.   ROS: See HPI.  Otherwise, noncontributory.  GEN: nad, alert and oriented HEENT: mucous membranes moist, tm w/o erythema, nasal exam w/o erythema, clear discharge noted,  OP with cobblestoning, max sinuses ttp NECK: supple w/o LA CV: rrr.   PULM: ctab, no inc wob EXT: no edema SKIN: no acute rash

## 2012-05-22 DIAGNOSIS — J329 Chronic sinusitis, unspecified: Secondary | ICD-10-CM | POA: Insufficient documentation

## 2012-05-22 NOTE — Assessment & Plan Note (Signed)
Asking about refill on ambien. Using for insomnia. Longstanding. Using CPAP for OSA.  Compliant until recent URI, can't use CPAP with nasal congestion.  Refill already sent. D/w pt.

## 2012-05-22 NOTE — Assessment & Plan Note (Signed)
Start amoxil. Nontoxic.  F/u prn.  Needs to stop smoking.

## 2012-06-07 ENCOUNTER — Ambulatory Visit (INDEPENDENT_AMBULATORY_CARE_PROVIDER_SITE_OTHER): Payer: Medicare Other | Admitting: Family Medicine

## 2012-06-07 ENCOUNTER — Telehealth: Payer: Self-pay | Admitting: Family Medicine

## 2012-06-07 ENCOUNTER — Encounter: Payer: Self-pay | Admitting: Family Medicine

## 2012-06-07 VITALS — BP 136/88 | HR 84 | Temp 98.1°F | Wt 228.2 lb

## 2012-06-07 DIAGNOSIS — R05 Cough: Secondary | ICD-10-CM | POA: Insufficient documentation

## 2012-06-07 MED ORDER — FLUOXETINE HCL 40 MG PO CAPS: 40.0000 mg | ORAL_CAPSULE | Freq: Every day | ORAL | Status: DC

## 2012-06-07 MED ORDER — DOXYCYCLINE HYCLATE 100 MG PO TABS: 100.0000 mg | ORAL_TABLET | Freq: Two times a day (BID) | ORAL | Status: DC

## 2012-06-07 MED ORDER — ALBUTEROL SULFATE HFA 108 (90 BASE) MCG/ACT IN AERS: 2.0000 | INHALATION_SPRAY | Freq: Four times a day (QID) | RESPIRATORY_TRACT | Status: DC | PRN

## 2012-06-07 NOTE — Progress Notes (Signed)
He's cut back to about 7 cigs a day.  He's trying to quit.  He realizes he likely won't have much improvement with continued smoking.   Seen ~3 weeks ago.  Still with chest congestion, cough is worse.  Rhinorrhea is some better.  Some sputum, some occ dry cough. Sore from coughing.  No fevers.  Can take a deep breath, but chest is sore, esp with deep breath.  Some wheeze noted.  Overall inc in sputum from baseline and discolored.    Meds, vitals, and allergies reviewed.   ROS: See HPI.  Otherwise, noncontributory.  nad ncat Tm wnl Nasal and OP exam wnl except for mild OP cobblestoning Neck supple, no LA rrr No focal dec in bs, no wheeze

## 2012-06-07 NOTE — Telephone Encounter (Signed)
Caller: Mckade/Patient; Patient Name: Mark Morris; PCP: Crawford Givens Clelia Croft); Best Callback Phone Number: [phone]  finished antibiotics about 05-31-12 for respiratory infection 06-07-12  He said he still has a lot of chest congestion and cough which is productive of green mucous.  No fever.  All emergent sxs per Cough protocols ruled out except for productive cough with colored sputum   Appt made for 1515 with Dr Para March for recheck

## 2012-06-07 NOTE — Patient Instructions (Addendum)
Use the inhaler every 6 hours as needed.  Start the doxycycline (sunburn caution) and let me know if you don't gradually improve.

## 2012-06-07 NOTE — Assessment & Plan Note (Signed)
Needs to stop smoking, discussed.  Cough improved after SABA use in office.  Continue SABA, start doxy.  F/u prn.

## 2012-06-15 ENCOUNTER — Other Ambulatory Visit: Payer: Self-pay | Admitting: Family Medicine

## 2012-06-15 NOTE — Telephone Encounter (Signed)
Electronic refill request

## 2012-06-17 ENCOUNTER — Other Ambulatory Visit: Payer: Self-pay | Admitting: Family Medicine

## 2012-06-19 ENCOUNTER — Other Ambulatory Visit (INDEPENDENT_AMBULATORY_CARE_PROVIDER_SITE_OTHER): Payer: Medicare Other

## 2012-06-19 DIAGNOSIS — E119 Type 2 diabetes mellitus without complications: Secondary | ICD-10-CM

## 2012-06-19 NOTE — Telephone Encounter (Signed)
Pt was seen 06/07/12; am not sure why this note did not come out of pool to Dr Para March.

## 2012-06-26 ENCOUNTER — Ambulatory Visit (INDEPENDENT_AMBULATORY_CARE_PROVIDER_SITE_OTHER): Payer: Medicare Other | Admitting: Family Medicine

## 2012-06-26 ENCOUNTER — Encounter: Payer: Self-pay | Admitting: Family Medicine

## 2012-06-26 VITALS — BP 140/90 | HR 92 | Temp 97.5°F | Wt 230.0 lb

## 2012-06-26 DIAGNOSIS — Z23 Encounter for immunization: Secondary | ICD-10-CM

## 2012-06-26 DIAGNOSIS — E119 Type 2 diabetes mellitus without complications: Secondary | ICD-10-CM

## 2012-06-26 DIAGNOSIS — J449 Chronic obstructive pulmonary disease, unspecified: Secondary | ICD-10-CM

## 2012-06-26 MED ORDER — ALBUTEROL SULFATE HFA 108 (90 BASE) MCG/ACT IN AERS: 2.0000 | INHALATION_SPRAY | Freq: Four times a day (QID) | RESPIRATORY_TRACT | Status: DC | PRN

## 2012-06-26 MED ORDER — BUDESONIDE-FORMOTEROL FUMARATE 160-4.5 MCG/ACT IN AERO: 1.0000 | INHALATION_SPRAY | Freq: Two times a day (BID) | RESPIRATORY_TRACT | Status: DC

## 2012-06-26 NOTE — Patient Instructions (Addendum)
Use the symbicort 1 puff twice a day.  Rinse after use.  Use albuterol only as needed.  Try to quit smoking.  Recheck labs in 6 months before a physical.  Take care.

## 2012-06-26 NOTE — Progress Notes (Signed)
Less sob, wheeze, and cough with SABA use, even after prev illness resolved.  Smoking 6 cigs a day, trying to quit.  "i'm trying to keep my mind off it."  We talked about preventive med use vs abortive tx.   Due for flu shot.  Recovered from prev illness.   He likely has COPD, this was discussed.   Diabetes:  Using medications without difficulties: yes Hypoglycemic episodes: very rare, only if prolonged fasting Hyperglycemic episodes:no Feet problems:no Blood Sugars averaging: ~110 in AM A1c 5.9.    Meds, vitals, and allergies reviewed.   ROS: See HPI.  Otherwise negative.    GEN: nad, alert and oriented HEENT: mucous membranes moist NECK: supple w/o LA CV: rrr. PULM: ctab except for scant exp wheeze noted, no inc wob ABD: soft, +bs EXT: no edema SKIN: no acute rash  Diabetic foot exam: Normal inspection No skin breakdown No calluses  Normal DP pulses Normal sensation to light touch and monofilament Nails normal

## 2012-06-27 DIAGNOSIS — J449 Chronic obstructive pulmonary disease, unspecified: Secondary | ICD-10-CM | POA: Insufficient documentation

## 2012-06-27 NOTE — Assessment & Plan Note (Signed)
Controlled, continue diet and meds.  Labs d/w pt.

## 2012-06-27 NOTE — Assessment & Plan Note (Signed)
Likely dx, prolonged smoker with sx reversible with SABA use.  Add on symbicort, flu shot done today.  SABA prn.  Needs to stop smoking.

## 2012-07-11 ENCOUNTER — Other Ambulatory Visit: Payer: Self-pay | Admitting: Family Medicine

## 2012-07-11 NOTE — Telephone Encounter (Signed)
Electronic refill request.  Given #60 with 1 RF on 05/10/12.  Please advise.

## 2012-07-11 NOTE — Telephone Encounter (Signed)
Caller: Kmari/Patient; Phone: [phone]; Reason for Call: Pt had Flexaril 10mg  po ordered TID prn.  But only 60 pills per day.  Pt is calling to say he was under the impression he could take the 3 pills per day and that's what he has been doing.  Pt has been taking them for the past month.  Pt is now completely out of the medication and needs a refill and clarification as to how many he can take.

## 2012-07-12 ENCOUNTER — Other Ambulatory Visit: Payer: Self-pay | Admitting: *Deleted

## 2012-07-12 NOTE — Telephone Encounter (Signed)
Left message asking patient to call back

## 2012-07-12 NOTE — Telephone Encounter (Signed)
Advised patient

## 2012-07-12 NOTE — Telephone Encounter (Signed)
Opened in error

## 2012-07-12 NOTE — Telephone Encounter (Signed)
He had been getting 60 at a time to take 1 tag tid prn.  He can take up to 3 a day, sedation caution.  I sent refill.

## 2012-08-14 ENCOUNTER — Other Ambulatory Visit: Payer: Self-pay | Admitting: Family Medicine

## 2012-08-14 ENCOUNTER — Other Ambulatory Visit: Payer: Self-pay | Admitting: *Deleted

## 2012-08-14 MED ORDER — ALPRAZOLAM 0.5 MG PO TABS: 0.5000 mg | ORAL_TABLET | Freq: Two times a day (BID) | ORAL | Status: DC | PRN

## 2012-08-14 NOTE — Telephone Encounter (Signed)
Please call in

## 2012-08-15 NOTE — Telephone Encounter (Signed)
rx called in

## 2012-09-08 ENCOUNTER — Ambulatory Visit: Payer: Self-pay | Admitting: Unknown Physician Specialty

## 2012-10-04 ENCOUNTER — Other Ambulatory Visit: Payer: Self-pay | Admitting: Family Medicine

## 2012-10-12 ENCOUNTER — Other Ambulatory Visit: Payer: Self-pay | Admitting: Family Medicine

## 2012-11-03 ENCOUNTER — Other Ambulatory Visit: Payer: Self-pay | Admitting: Family Medicine

## 2012-11-03 NOTE — Telephone Encounter (Signed)
Electronic refill request.  Please advise. 

## 2012-11-03 NOTE — Telephone Encounter (Signed)
Sent!

## 2012-11-09 ENCOUNTER — Other Ambulatory Visit: Payer: Self-pay | Admitting: Family Medicine

## 2012-11-17 ENCOUNTER — Other Ambulatory Visit: Payer: Self-pay | Admitting: *Deleted

## 2012-11-17 NOTE — Telephone Encounter (Signed)
Both meds last filled 10/18/12

## 2012-11-19 MED ORDER — ZOLPIDEM TARTRATE 10 MG PO TABS: 10.0000 mg | ORAL_TABLET | Freq: Every evening | ORAL | Status: DC | PRN

## 2012-11-19 MED ORDER — ALPRAZOLAM 0.5 MG PO TABS: 0.5000 mg | ORAL_TABLET | Freq: Two times a day (BID) | ORAL | Status: DC | PRN

## 2012-11-19 NOTE — Telephone Encounter (Signed)
Please call in

## 2012-11-20 NOTE — Telephone Encounter (Signed)
Rx phoned to pharmacy.  

## 2012-12-17 ENCOUNTER — Other Ambulatory Visit: Payer: Self-pay | Admitting: Family Medicine

## 2012-12-17 DIAGNOSIS — E119 Type 2 diabetes mellitus without complications: Secondary | ICD-10-CM

## 2012-12-17 DIAGNOSIS — Z125 Encounter for screening for malignant neoplasm of prostate: Secondary | ICD-10-CM

## 2012-12-18 ENCOUNTER — Other Ambulatory Visit (INDEPENDENT_AMBULATORY_CARE_PROVIDER_SITE_OTHER): Payer: Medicare Other

## 2012-12-18 DIAGNOSIS — E119 Type 2 diabetes mellitus without complications: Secondary | ICD-10-CM

## 2012-12-18 DIAGNOSIS — Z125 Encounter for screening for malignant neoplasm of prostate: Secondary | ICD-10-CM

## 2012-12-18 DIAGNOSIS — N401 Enlarged prostate with lower urinary tract symptoms: Secondary | ICD-10-CM

## 2012-12-18 DIAGNOSIS — E785 Hyperlipidemia, unspecified: Secondary | ICD-10-CM

## 2012-12-18 DIAGNOSIS — I10 Essential (primary) hypertension: Secondary | ICD-10-CM

## 2012-12-18 LAB — HEMOGLOBIN A1C: Hgb A1c MFr Bld: 6.1 % (ref 4.6–6.5)

## 2012-12-19 LAB — PSA, MEDICARE: PSA: 0.21 ng/ml (ref 0.10–4.00)

## 2012-12-19 LAB — COMPREHENSIVE METABOLIC PANEL
Albumin: 4.1 g/dL (ref 3.5–5.2)
Alkaline Phosphatase: 95 U/L (ref 39–117)
BUN: 16 mg/dL (ref 6–23)
Calcium: 8.9 mg/dL (ref 8.4–10.5)
Glucose, Bld: 99 mg/dL (ref 70–99)
Potassium: 4.8 mEq/L (ref 3.5–5.1)

## 2012-12-19 LAB — LIPID PANEL
Cholesterol: 152 mg/dL (ref 0–200)
VLDL: 14.6 mg/dL (ref 0.0–40.0)

## 2012-12-25 ENCOUNTER — Encounter: Payer: Self-pay | Admitting: Family Medicine

## 2012-12-25 ENCOUNTER — Ambulatory Visit (INDEPENDENT_AMBULATORY_CARE_PROVIDER_SITE_OTHER): Payer: Medicare Other | Admitting: Family Medicine

## 2012-12-25 VITALS — BP 126/78 | HR 88 | Temp 97.6°F | Ht 71.5 in | Wt 228.3 lb

## 2012-12-25 DIAGNOSIS — E785 Hyperlipidemia, unspecified: Secondary | ICD-10-CM

## 2012-12-25 DIAGNOSIS — Z0000 Encounter for general adult medical examination without abnormal findings: Secondary | ICD-10-CM | POA: Insufficient documentation

## 2012-12-25 DIAGNOSIS — J449 Chronic obstructive pulmonary disease, unspecified: Secondary | ICD-10-CM

## 2012-12-25 DIAGNOSIS — F329 Major depressive disorder, single episode, unspecified: Secondary | ICD-10-CM

## 2012-12-25 DIAGNOSIS — E119 Type 2 diabetes mellitus without complications: Secondary | ICD-10-CM

## 2012-12-25 DIAGNOSIS — N401 Enlarged prostate with lower urinary tract symptoms: Secondary | ICD-10-CM

## 2012-12-25 DIAGNOSIS — I10 Essential (primary) hypertension: Secondary | ICD-10-CM

## 2012-12-25 MED ORDER — LISINOPRIL 20 MG PO TABS: 10.0000 mg | ORAL_TABLET | Freq: Every day | ORAL | Status: DC

## 2012-12-25 MED ORDER — METFORMIN HCL 500 MG PO TABS: 500.0000 mg | ORAL_TABLET | Freq: Every day | ORAL | Status: DC

## 2012-12-25 NOTE — Assessment & Plan Note (Signed)
Exacerbated by family situations.  Continue current meds.  He hopes the family situation will improve.

## 2012-12-25 NOTE — Patient Instructions (Addendum)
Check with your insurance to see if they will cover the shingles shot. I would get a flu shot each fall.   Cut the lisinopril back to 10mg  a day see if the lightheaded sensations get better.  Cut the metformin to 1 pill a day and see if that helps the low sugars.  Recheck labs in 3 months.  Take care.

## 2012-12-25 NOTE — Assessment & Plan Note (Signed)
psa wnl.  Stream is "okay" per patient.  Continue cardura.

## 2012-12-25 NOTE — Assessment & Plan Note (Signed)
Cut metformin to qd given the occ low sugars.  Recheck in 3months.

## 2012-12-25 NOTE — Assessment & Plan Note (Signed)
See scanned forms.  Routine anticipatory guidance given to patient.  See health maintenance. Flu 2013 Shingles encourage for age 60 PNA 2012 Tetanus 2005 Colon 2013 per Dr. Mechele Collin Prostate cancer screening- PSA wnl.  Cognitive function addressed- see scanned forms- and if abnormal then additional documentation follows.  Smoking discussed, encouraged cessation.

## 2012-12-25 NOTE — Assessment & Plan Note (Signed)
Mild transaminitis noted.  No abd pain, no jaundice.  No vomiting.  Noted incidentally on labs.  On statin.  Would continue for now.  Likely fatty liver given the habitus and DM2.  Will follow.  abd not ttp.

## 2012-12-25 NOTE — Progress Notes (Signed)
I have personally reviewed the Medicare Annual Wellness questionnaire and have noted 1. The patient's medical and social history 2. Their use of alcohol, tobacco or illicit drugs 3. Their current medications and supplements 4. The patient's functional ability including ADL's, fall risks, home safety risks and hearing or visual             impairment. 5. Diet and physical activities 6. Evidence for depression or mood disorders  The patients weight, height, BMI have been recorded in the chart and visual acuity is per eye clinic.  I have made referrals, counseling and provided education to the patient based review of the above and I have provided the pt with a written personalized care plan for preventive services.  See scanned forms.  Routine anticipatory guidance given to patient.  See health maintenance. Flu 2013 Shingles encourage for age 60 PNA 2012 Tetanus 2005 Colon 2013 per Dr. Mechele Collin Prostate cancer screening- PSA wnl.  Cognitive function addressed- see scanned forms- and if abnormal then additional documentation follows.  Smoking discussed, encouraged cessation.   Mood is low and family concerns are the main issue here.  He is worried about his grandchild and her kids. No SI/HI.  Taking ambien to sleep, w/o ADE and it does help. Still on prozac, with some relief. Xanax BID helps with anxiety.  He doesn't expect his situation to improve until the family situation improves.     Diabetes:  Using medications without difficulties:yes Hypoglycemic episodes: occ Hyperglycemic episodes: occ Feet problems: no Blood Sugars averaging: ~100-120  Hypertension:    Using medication without problems or lightheadedness- rare lightheaded sensation Chest pain with exertion: no Edema:no Short of breath: some  Mild transaminitis noted.  No abd pain, no jaundice.  No vomiting.  Noted incidentally on labs.  On statin.   PMH and SH reviewed  Meds, vitals, and allergies reviewed.   ROS: See  HPI.  Otherwise negative.    GEN: nad, alert and oriented HEENT: mucous membranes moist NECK: supple w/o LA CV: rrr. PULM: ctab, no inc wob, no wheeze ABD: soft, +bs EXT: no edema SKIN: no acute rash  Diabetic foot exam: Normal inspection No skin breakdown No calluses  Normal DP pulses Normal sensation to light touch and monofilament Nails normal

## 2012-12-25 NOTE — Assessment & Plan Note (Signed)
D/w pt about cessation of smoking.

## 2012-12-25 NOTE — Assessment & Plan Note (Signed)
Continue current meds for now

## 2012-12-25 NOTE — Assessment & Plan Note (Signed)
Cut lisinopril to 10mg  a day.  He'll notify me if lightheaded sensations continue.  D/w pt about smoking.

## 2013-01-02 ENCOUNTER — Telehealth: Payer: Self-pay | Admitting: Family Medicine

## 2013-01-02 MED ORDER — LISINOPRIL 10 MG PO TABS: ORAL_TABLET | ORAL | Status: DC

## 2013-01-02 MED ORDER — LISINOPRIL 5 MG PO TABS: 5.0000 mg | ORAL_TABLET | Freq: Every day | ORAL | Status: DC

## 2013-01-02 MED ORDER — LISINOPRIL 5 MG PO TABS: ORAL_TABLET | ORAL | Status: DC

## 2013-01-02 MED ORDER — METFORMIN HCL 500 MG PO TABS: 500.0000 mg | ORAL_TABLET | Freq: Every day | ORAL | Status: DC

## 2013-01-02 NOTE — Telephone Encounter (Signed)
Patient Information:  Caller Name: [place]  Phone: [phone]  Patient: Mark Morris, Mark Morris  Gender: Male  DOB: Jan 24, 1951  Age: 62 Years  PCP: Crawford Givens Clelia Croft) [facility])  Office Follow Up:  Does the office need to follow up with this patient?: Yes  Instructions For The Office: Reviewed Home care instructions and call back parameters. Advised I would forward medicaiton concerns to physician for review. Understanding expressed. NEW PHARMACY INFORMATION- [facility] [address].  RN Note:  Reviewed Home care instructions and call back parameters. Advised I would forward medicaiton concerns to physician for review. Understanding expressed. NEW PHARMACY INFORMATION- [facility] [address].  Symptoms  Reason For Call & Symptoms: Patient in the office on 12/25/12 to see Dr. Para March. Medication changes were made to Metformin and lisiinopril.  He is taking one Metformin 500mg  daily and Lisinopril 10mg  daily.  He reports his blood pressure is running high 156/99 and has been up to 200/.  His GBS have #160-#190.  Reviewed Health History In EMR: Yes  Reviewed Medications In EMR: Yes  Reviewed Allergies In EMR: Yes  Reviewed Surgeries / Procedures: Yes  Date of Onset of Symptoms: 12/26/2012  Guideline(s) Used:  High Blood Pressure  Diabetes - High Blood Sugar  Disposition Per Guideline:   Discuss with PCP and Callback by Nurse Today  Reason For Disposition Reached:   Caller has NON-URGENT medication question about med that PCP prescribed and triager unable to answer question  Advice Given:  Lifestyle Changes  Maintain a healthy weight. Lose weight if you are overweight.  Do 30 minutes of aerobic physical activity (e.g., brisk walking) most days of the week.  Eat a diet high in fresh fruits and low-fat dairy products. Limit your intake of saturated and total fat. Choose foods that are lower in salt.  If you smoke, you should stop.  If you drink alcohol, you should limit your daily  alcohol drinking. Women should have no more than one drink per day. Men should have no more than 2 drinks per day. A drink is defined as 1.5 oz hard liquor (one shot or jigger; 45 ml), 5 oz wine (small glass; 150 ml), or 12 oz beer (one can; 360 ml).  Treatment - Liquids  Drink at least one glass (8 oz or 240 ml) of water per hour for the next 4 hours. (Reason: adequate hydration will reduce hyperglycemia).  Generally, you should try to drink 6-8 glasses of water each day.  Treatment - Diabetes Medications  : Continue taking your diabetes pills.  Call Back If:  Blood glucose more than 300 mg/dL (47.8 mmol/l), 2 or more times in a row.  Vomiting lasting more than 4 hours or unable to drink any liquids.  Rapid breathing occurs  You become worse.  Patient Will Follow Care Advice:  YES

## 2013-01-02 NOTE — Telephone Encounter (Signed)
Patient advised.  Patient asked that we send in the Rx for the 10 mg tab also.  Rx sent.

## 2013-01-02 NOTE — Telephone Encounter (Signed)
Thanks

## 2013-01-02 NOTE — Telephone Encounter (Signed)
He'll need 15mg  of lisinopril but to do that he'll have to take 10 +5 mg.  rx for 5mg  sent.  Would add back 0.5 extra tab of metformin per day.  Thanks.

## 2013-02-01 ENCOUNTER — Other Ambulatory Visit: Payer: Self-pay

## 2013-02-01 MED ORDER — GLUCOSE BLOOD VI STRP: ORAL_STRIP | Status: DC

## 2013-02-01 NOTE — Telephone Encounter (Signed)
Mark Morris request refill contour test strips to [facility] [address]. Advised done.

## 2013-02-20 ENCOUNTER — Other Ambulatory Visit: Payer: Self-pay | Admitting: Family Medicine

## 2013-02-20 MED ORDER — POTASSIUM CHLORIDE ER 10 MEQ PO TBCR: EXTENDED_RELEASE_TABLET | ORAL | Status: DC

## 2013-02-20 NOTE — Telephone Encounter (Signed)
Electronic refill request.  Please advise. 

## 2013-02-21 NOTE — Telephone Encounter (Signed)
Mrs Coba called to ck on status of alprazolam refill; pt is out of med and request filled today to [facility] [address].

## 2013-02-21 NOTE — Telephone Encounter (Signed)
We'll always need at least 1 business day on these.  Please call in. Thanks.

## 2013-02-21 NOTE — Telephone Encounter (Signed)
Rx called to pharmacy.  Patient's wife advised by telephone as instructed.

## 2013-02-26 ENCOUNTER — Encounter: Payer: Self-pay | Admitting: Cardiovascular Disease

## 2013-02-26 ENCOUNTER — Ambulatory Visit (INDEPENDENT_AMBULATORY_CARE_PROVIDER_SITE_OTHER): Payer: Medicare Other | Admitting: Cardiovascular Disease

## 2013-02-26 VITALS — BP 132/84 | HR 94 | Ht 71.0 in | Wt 230.8 lb

## 2013-02-26 DIAGNOSIS — R5381 Other malaise: Secondary | ICD-10-CM

## 2013-02-26 DIAGNOSIS — F17200 Nicotine dependence, unspecified, uncomplicated: Secondary | ICD-10-CM

## 2013-02-26 DIAGNOSIS — I499 Cardiac arrhythmia, unspecified: Secondary | ICD-10-CM

## 2013-02-26 DIAGNOSIS — G4730 Sleep apnea, unspecified: Secondary | ICD-10-CM

## 2013-02-26 DIAGNOSIS — R5383 Other fatigue: Secondary | ICD-10-CM

## 2013-02-26 DIAGNOSIS — I10 Essential (primary) hypertension: Secondary | ICD-10-CM

## 2013-02-26 DIAGNOSIS — R5382 Chronic fatigue, unspecified: Secondary | ICD-10-CM

## 2013-02-26 DIAGNOSIS — I2510 Atherosclerotic heart disease of native coronary artery without angina pectoris: Secondary | ICD-10-CM

## 2013-02-26 DIAGNOSIS — E119 Type 2 diabetes mellitus without complications: Secondary | ICD-10-CM

## 2013-02-26 DIAGNOSIS — R0602 Shortness of breath: Secondary | ICD-10-CM

## 2013-02-26 DIAGNOSIS — R079 Chest pain, unspecified: Secondary | ICD-10-CM

## 2013-02-26 DIAGNOSIS — E785 Hyperlipidemia, unspecified: Secondary | ICD-10-CM

## 2013-02-26 DIAGNOSIS — Z79899 Other long term (current) drug therapy: Secondary | ICD-10-CM

## 2013-02-26 NOTE — Patient Instructions (Addendum)
You are doing well. No medication changes were made.  We have ordered a few labs to be done with your regular labs in august. After review of these, if normal labs, we could consider a stress test  Please call us if you have new issues that need to be addressed before your next appt.  Your physician wants you to follow-up in: 12 months.  You will receive a reminder letter in the mail two months in advance. If you don't receive a letter, please call our office to schedule the follow-up appointment.

## 2013-02-26 NOTE — Assessment & Plan Note (Signed)
Long-standing issue. We have suggested recheck his TSH and testosterone level before proceeding with any cardiac testing.

## 2013-02-26 NOTE — Assessment & Plan Note (Signed)
We have encouraged continued exercise, careful diet management in an effort to lose weight. 

## 2013-02-26 NOTE — Assessment & Plan Note (Signed)
We have encouraged him to continue to work on weaning his cigarettes and smoking cessation. He will continue to work on this and does not want any assistance with chantix.  

## 2013-02-26 NOTE — Assessment & Plan Note (Signed)
Blood pressure is well controlled on today's visit. No changes made to the medications. 

## 2013-02-26 NOTE — Progress Notes (Signed)
Patient ID: Mark Morris, male    DOB: 1951-05-28, 62 y.o.   MRN: [id number]  HPI Comments: Mark Morris is a 62 year old gentleman with history of mild CAD in 2004, Myoview in 2005 showing no significant ischemia, chronic fatigue and poor energy for the past 4-5 years, history of SVT requiring adenosine, who presents for routine followup.  He continues to smoke one half pack per day, has diabetes which is well controlled with hemoglobin A1c 6.0. Total cholesterol 143, LDL 75  His main complaint is his fatigue. He gives out if he tries to do anything. This has been a chronic issue. Reports that he sleeps relatively well, possible apnea symptoms. Denies any chest pain. He does not exercise on a regular basis. He does report having rare lightheaded spells lasting for several minutes, twice per week on average and improved with lying down.  EKG shows normal sinus rhythm with rate 94 beats per minute, nonspecific ST abnormality in the anterolateral leads   Past Medical History . Hyperlipidemia 08/23/1997 . Allergy 2002 . Hypertension 1980 . Anxiety 08/24/1999 . Depression 08/24/1999 . Diabetes mellitus 08/24/2003 . Enteritis 10/18/2004  CT abd. Enteritis//CT pelvis Sacroiliac Spurring . Sigmoid polyp 07/03/2007  colonoscopy Small internal hemorrhoids; Sigmoid polyp B9 (Dr. Elliott)//colonoscopy normal 04/06/2004 . CAD,  Nonobstructive by cardiac catheterization 5/04: Proximal RCA 25%, EF 65%;  Myoview 12/05: no scar or ischemia, EF 58% . Sleep apnea  . BPH (benign prostatic hyperplasia)  . Paroxysmal SVT (supraventricular tachycardia) . Tobacco abuse    Past Surgical History . Cardiac catheterization 01/14/2003   Min plaque EF65%// Cath normal (Dr. Juliann Pares 2000)//ECHO Mild AAA TrMR Ef61% 05/30/2002 (Dr.Callwood) . Gastric fundoplication [facility] . Hernia repair 01/02/2006   right ventral Hernia Repair Laparascopic Leretha Pol . 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 Sleep  study 03/2004    Sleep Study Hypopneas 60/hr ; Kicks 160/hr . Ankle fracture surgery 02/2003 . Carpal tunnel release  . Back surgery  . Cholecystectomy  . Appendectomy      Outpatient Encounter Prescriptions as of 02/26/2013  Medication Sig Dispense Refill  . albuterol (PROVENTIL HFA;VENTOLIN HFA) 108 (90 BASE) MCG/ACT inhaler Inhale 2 puffs into the lungs every 6 (six) hours as needed for wheezing.  1 Inhaler  5  . ALPRAZolam (XANAX) 0.5 MG tablet TAKE ONE TABLET BY MOUTH TWICE DAILY AS NEEDED FOR  SLEEP  60 tablet  2  . budesonide-formoterol (SYMBICORT) 160-4.5 MCG/ACT inhaler Inhale 1 puff into the lungs 2 (two) times daily. Rinse after use  1 Inhaler  12  . calcium elemental as carbonate (CALCIUM ANTACID ULTRA) 400 MG tablet Chew 1,000 mg by mouth daily.        . cyclobenzaprine (FLEXERIL) 10 MG tablet TAKE ONE TABLET BY MOUTH THREE TIMES DAILY AS NEEDED FOR MUSCLE SPASM  90 tablet  2  . doxazosin (CARDURA) 2 MG tablet TAKE ONE TABLET BY MOUTH EVERY DAY  90 tablet  2  . fexofenadine (ALLEGRA) 180 MG tablet Take 180 mg by mouth daily.        . finasteride (PROSCAR) 5 MG tablet TAKE ONE TABLET BY MOUTH EVERY DAY  90 tablet  2  . FLUoxetine (PROZAC) 40 MG capsule Take 1 capsule (40 mg total) by mouth daily.  90 capsule  3  . glucose blood (BAYER CONTOUR TEST) test strip Test blood sugar once a day and as directed. Dx 250.00  50 each  5  . guaifenesin (HUMIBID E) 400 MG TABS  Take 400 mg by mouth every 6 (six) hours as needed.        Marland Kitchen lisinopril (PRINIVIL,ZESTRIL) 10 MG tablet Take one tablet by mouth daily along with the 5 mg tablet for a total of 15 mg daily.  90 tablet  3  . lisinopril (PRINIVIL,ZESTRIL) 5 MG tablet Take 1 tablet by mouth daily along with the 10 mg tablet for a total of 15 mg daily.  90 tablet  3  . meloxicam (MOBIC) 15 MG tablet TAKE ONE TABLET BY MOUTH ONE TIME DAILY WITH FOOD  30 tablet  4  . metFORMIN (GLUCOPHAGE) 500 MG tablet Take 1 tablet (500 mg total) by mouth  daily with breakfast. And 0.5 tab at night also.      Marland Kitchen MICROLET LANCETS MISC USE AS INSTRUCTED TO CHECK BLOOD SUGAR ONCE DAILY  100 each  2  . Multiple Vitamin (MULTIVITAMIN) tablet Take 1 tablet by mouth daily.        . potassium chloride (KLOR-CON 10) 10 MEQ tablet TAKE ONE TABLET BY MOUTH EVERY DAY  90 tablet  0  . rOPINIRole (REQUIP) 1 MG tablet TAKE  ONE TABLET BY MOUTH NIGHTLY AT BEDTIME  30 tablet  10  . simvastatin (ZOCOR) 20 MG tablet TAKE ONE TABLET BY MOUTH EVERY DAY  90 tablet  2  . zolpidem (AMBIEN) 10 MG tablet Take 1 tablet (10 mg total) by mouth at bedtime as needed for sleep.  30 tablet  5    Review of Systems  Constitutional: Negative.   HENT: Negative.   Eyes: Negative.   Respiratory: Negative.   Cardiovascular: Negative.   Gastrointestinal: Negative.   Musculoskeletal: Negative.   Skin: Negative.   Neurological: Positive for light-headedness.  Psychiatric/Behavioral: Negative.   All other systems reviewed and are negative.    BP 132/84  Pulse 94  Ht 5\' 11"  (1.803 m)  Wt 230 lb 12 oz (104.668 kg)  BMI 32.2 kg/m2  Physical Exam  Nursing note and vitals reviewed. Constitutional: He is oriented to person, place, and time. He appears well-developed and well-nourished.  HENT:  Head: Normocephalic.  Nose: Nose normal.  Mouth/Throat: Oropharynx is clear and moist.  Eyes: Conjunctivae are normal. Pupils are equal, round, and reactive to light.  Neck: Normal range of motion. Neck supple. No JVD present.  Cardiovascular: Normal rate, regular rhythm, S1 normal, S2 normal, normal heart sounds and intact distal pulses.  Exam reveals no gallop and no friction rub.   No murmur heard. Pulmonary/Chest: Effort normal and breath sounds normal. No respiratory distress. He has no wheezes. He has no rales. He exhibits no tenderness.  Abdominal: Soft. Bowel sounds are normal. He exhibits no distension. There is no tenderness.  Musculoskeletal: Normal range of motion. He  exhibits no edema and no tenderness.  Lymphadenopathy:    He has no cervical adenopathy.  Neurological: He is alert and oriented to person, place, and time. Coordination normal.  Skin: Skin is warm and dry. No rash noted. No erythema.  Psychiatric: He has a normal mood and affect. His behavior is normal. Judgment and thought content normal.      Assessment and Plan

## 2013-02-26 NOTE — Assessment & Plan Note (Addendum)
Uncertain if his symptoms of fatigue are from underlying CAD. By his history, he does not have much of a history of underlying CAD 10 years ago. Repeat stress test could be ordered  be ordered and was discussed with him.  He would like to see how his blood work looks in August 2014 before proceeding with any testing.

## 2013-02-26 NOTE — Assessment & Plan Note (Signed)
This could possibly be contributing to his fatigue

## 2013-02-26 NOTE — Assessment & Plan Note (Signed)
Cholesterol is at goal on the current lipid regimen. No changes to the medications were made.  

## 2013-03-26 ENCOUNTER — Encounter: Payer: Self-pay | Admitting: Radiology

## 2013-03-27 ENCOUNTER — Other Ambulatory Visit (INDEPENDENT_AMBULATORY_CARE_PROVIDER_SITE_OTHER): Payer: Medicare Other

## 2013-03-27 DIAGNOSIS — E785 Hyperlipidemia, unspecified: Secondary | ICD-10-CM

## 2013-03-27 DIAGNOSIS — E119 Type 2 diabetes mellitus without complications: Secondary | ICD-10-CM

## 2013-03-27 DIAGNOSIS — R7402 Elevation of levels of lactic acid dehydrogenase (LDH): Secondary | ICD-10-CM

## 2013-03-27 LAB — LIPID PANEL
HDL: 52.8 mg/dL (ref >39.00)
Total CHOL/HDL Ratio: 3
VLDL: 21.2 mg/dL (ref 0.0–40.0)

## 2013-03-27 LAB — HEMOGLOBIN A1C: Hgb A1c MFr Bld: 6 % (ref 4.6–6.5)

## 2013-04-03 ENCOUNTER — Encounter: Payer: Self-pay | Admitting: Family Medicine

## 2013-04-03 ENCOUNTER — Ambulatory Visit (INDEPENDENT_AMBULATORY_CARE_PROVIDER_SITE_OTHER): Payer: Medicare Other | Admitting: Family Medicine

## 2013-04-03 VITALS — BP 150/84 | HR 84 | Temp 97.4°F | Ht 71.0 in | Wt 235.0 lb

## 2013-04-03 DIAGNOSIS — R0602 Shortness of breath: Secondary | ICD-10-CM

## 2013-04-03 DIAGNOSIS — Z720 Tobacco use: Secondary | ICD-10-CM

## 2013-04-03 DIAGNOSIS — E785 Hyperlipidemia, unspecified: Secondary | ICD-10-CM

## 2013-04-03 DIAGNOSIS — R5381 Other malaise: Secondary | ICD-10-CM

## 2013-04-03 DIAGNOSIS — F17200 Nicotine dependence, unspecified, uncomplicated: Secondary | ICD-10-CM

## 2013-04-03 DIAGNOSIS — Z79899 Other long term (current) drug therapy: Secondary | ICD-10-CM

## 2013-04-03 DIAGNOSIS — I499 Cardiac arrhythmia, unspecified: Secondary | ICD-10-CM

## 2013-04-03 DIAGNOSIS — R079 Chest pain, unspecified: Secondary | ICD-10-CM

## 2013-04-03 DIAGNOSIS — E119 Type 2 diabetes mellitus without complications: Secondary | ICD-10-CM

## 2013-04-03 DIAGNOSIS — I10 Essential (primary) hypertension: Secondary | ICD-10-CM

## 2013-04-03 DIAGNOSIS — R5383 Other fatigue: Secondary | ICD-10-CM

## 2013-04-03 MED ORDER — METFORMIN HCL 500 MG PO TABS: 500.0000 mg | ORAL_TABLET | Freq: Every day | ORAL | Status: DC

## 2013-04-03 NOTE — Assessment & Plan Note (Signed)
Controlled, continue as is.  Labs d/w pt.  

## 2013-04-03 NOTE — Assessment & Plan Note (Signed)
Controlled, no change in meds.  D/w pt about diet.  Recheck in about 6 months.

## 2013-04-03 NOTE — Assessment & Plan Note (Addendum)
I asked him to take his cuff tomorrow to double check (again) with cards.  I didn't change his meds at this point. He has no CP at the exam.

## 2013-04-03 NOTE — Patient Instructions (Addendum)
Go to the lab on the way out.  Either I or Dr. Mariah Milling will contact you with your lab report. Take your cuff and BP readings to Dr. Mariah Milling tomorrow.  I'll await the cardiology appointment outcome.  Recheck DM2 in about 6 months, sooner if needed for BP recheck.

## 2013-04-03 NOTE — Progress Notes (Signed)
Diabetes:  Using medications without difficulties: less diarrhea on current dose of metformin.  Hypoglycemic episodes:no Hyperglycemic episodes:no Feet problems: no Blood Sugars averaging: ~110  Hypertension:   Using medication without problems or lightheadedness: yes Chest pain with exertion: yes, he has f/u with cards tomorrow, no CP now Edema:no Short of breath:no but he can walk to the mailbox and back and then get tired Average home BPs: No plans to quit smoking.   He has labs ordered by Dr. Mariah Milling pending.  He's had B upper thigh cramping with walking.   BP has been elevated on mult home checks.  On his cuff his systolic was similar but his diastolic was ~40 points higher.  I question the accuracy of his cuff.    Elevated Cholesterol: Using medications without problems: yes Muscle aches:  As above Diet compliance: "gaining weight evidently."   Exercise: limited as above  PMH and SH reviewed.   Vital signs, Meds and allergies reviewed.  ROS: See HPI.  Otherwise nontributory.   GEN: nad, alert and oriented HEENT: mucous membranes moist NECK: supple w/o LA CV: rrr. PULM: ctab, no inc wob ABD: soft, +bs EXT: no edema SKIN: no acute rash  Diabetic foot exam: Normal inspection No skin breakdown No calluses  Normal DP pulses, symmetric Normal sensation to light tough and monofilament Nails normal

## 2013-04-04 NOTE — Assessment & Plan Note (Signed)
Encouraged cessation.

## 2013-04-06 ENCOUNTER — Ambulatory Visit (INDEPENDENT_AMBULATORY_CARE_PROVIDER_SITE_OTHER): Payer: Medicare Other | Admitting: Cardiovascular Disease

## 2013-04-06 ENCOUNTER — Encounter: Payer: Self-pay | Admitting: Cardiovascular Disease

## 2013-04-06 VITALS — BP 110/80 | HR 91 | Ht 71.0 in | Wt 230.8 lb

## 2013-04-06 DIAGNOSIS — R5383 Other fatigue: Secondary | ICD-10-CM

## 2013-04-06 DIAGNOSIS — R0602 Shortness of breath: Secondary | ICD-10-CM

## 2013-04-06 DIAGNOSIS — F17200 Nicotine dependence, unspecified, uncomplicated: Secondary | ICD-10-CM

## 2013-04-06 DIAGNOSIS — I10 Essential (primary) hypertension: Secondary | ICD-10-CM

## 2013-04-06 DIAGNOSIS — I2510 Atherosclerotic heart disease of native coronary artery without angina pectoris: Secondary | ICD-10-CM

## 2013-04-06 DIAGNOSIS — J449 Chronic obstructive pulmonary disease, unspecified: Secondary | ICD-10-CM

## 2013-04-06 DIAGNOSIS — I471 Supraventricular tachycardia: Secondary | ICD-10-CM

## 2013-04-06 DIAGNOSIS — R079 Chest pain, unspecified: Secondary | ICD-10-CM

## 2013-04-06 DIAGNOSIS — Z720 Tobacco use: Secondary | ICD-10-CM

## 2013-04-06 DIAGNOSIS — D649 Anemia, unspecified: Secondary | ICD-10-CM

## 2013-04-06 DIAGNOSIS — R5381 Other malaise: Secondary | ICD-10-CM

## 2013-04-06 DIAGNOSIS — F329 Major depressive disorder, single episode, unspecified: Secondary | ICD-10-CM

## 2013-04-06 LAB — TESTOSTERONE, FREE, TOTAL, SHBG: Testosterone, Free: 4.2 pg/mL — ABNORMAL LOW (ref 7.2–24.0)

## 2013-04-06 MED ORDER — LISINOPRIL 20 MG PO TABS: 20.0000 mg | ORAL_TABLET | Freq: Every day | ORAL | Status: DC

## 2013-04-06 MED ORDER — DOXAZOSIN MESYLATE 4 MG PO TABS: 4.0000 mg | ORAL_TABLET | Freq: Every day | ORAL | Status: DC

## 2013-04-06 NOTE — Assessment & Plan Note (Signed)
Uncertain as to what role COPD and continued smoking is playing in his chronic fatigue. We did offer pulmonary rehabilitation for his fatigue. I suspect deconditioning is playing a role in his symptoms.

## 2013-04-06 NOTE — Patient Instructions (Addendum)
You are doing well. Please change lisinopril to 20 mg daily If blood pressure continues to run high, Increase the cardura to 4 mg daily  We will schedule a pharmacological stress test  (lexiscan) at [facility] No smoking the morning of the test No food the morning of the test   We will check a CBC today  Please call us if you have new issues that need to be addressed before your next appt.  Your physician wants you to follow-up in: 6 months.  You will receive a reminder letter in the mail two months in advance. If you don't receive a letter, please call our office to schedule the follow-up appointment.     [facility] MYOVIEW  Your caregiver has ordered a Stress Test with nuclear imaging. The purpose of this test is to evaluate the blood supply to your heart muscle. This procedure is referred to as a "Non-Invasive Stress Test." This is because other than having an IV started in your vein, nothing is inserted or "invades" your body. Cardiac stress tests are done to find areas of poor blood flow to the heart by determining the extent of coronary artery disease (CAD). Some patients exercise on a treadmill, which naturally increases the blood flow to your heart, while others who are  unable to walk on a treadmill due to physical limitations have a pharmacologic/chemical stress agent called Lexiscan . This medicine will mimic walking on a treadmill by temporarily increasing your coronary blood flow.   Please note: these test may take anywhere between 2-4 hours to complete  PLEASE REPORT TO [facility] ENTR[facility]  THE VOLUNTEERS AT THE FIRST DESK WILL DIRECT YOU WHERE TO GO  Date of Procedure:________Wed. August 20th_________________  Arrival Time for Procedure:___________7:15am_______________  Instructions regarding medication:   __NO__ : Hold diabetes medication morning of procedure  __NO__:  Hold betablocker(s) night before procedure and morning of procedure  ____:  Hold other medications  as follows:_________________________________________________________________________________________________________________________________________________________________________________________________________________________________________________________________________________________  PLEASE NOTIFY THE OFFICE AT LEAST 24 HOURS IN ADVANCE IF YOU ARE UNABLE TO KEEP YOUR APPOINTMENT.  [phone] AND  PLEASE NOTIFY NUCLEAR MEDICINE AT [facility] AT LEAST 24 HOURS IN ADVANCE IF YOU ARE UNABLE TO KEEP YOUR APPOINTMENT. [phone]  How to prepare for your Myoview test:  1. Do not eat or drink after midnight 2. No caffeine for 24 hours prior to test 3. No smoking 24 hours prior to test. 4. Your medication may be taken with water.  If your doctor stopped a medication because of this test, do not take that medication. 5. Ladies, please do not wear dresses.  Skirts or pants are appropriate. Please wear a short sleeve shirt. 6. No perfume, cologne or lotion. 7. Wear comfortable walking shoes. No heels!          Marland Kitchen

## 2013-04-06 NOTE — Assessment & Plan Note (Signed)
No recent episodes of arrhythmia. 

## 2013-04-06 NOTE — Assessment & Plan Note (Signed)
We have encouraged him to continue to work on weaning his cigarettes and smoking cessation. He will continue to work on this and does not want any assistance with chantix.  

## 2013-04-06 NOTE — Assessment & Plan Note (Signed)
Significant fatigue, getting worse, now worsening shortness of breath with exertion. We'll schedule a pharmacologic Myoview as he is unable to treadmill.

## 2013-04-06 NOTE — Progress Notes (Signed)
Patient ID: Mark Morris, male    DOB: 1950-11-24, 62 y.o.   MRN: [id number]  HPI Comments: Mark Morris is a 62 -year-old gentleman with history of mild CAD in 2004, Myoview in 2005 showing no significant ischemia, chronic fatigue and poor energy for the past 4-5 years, history of SVT requiring adenosine, who presents for routine followup.  He continues to smoke one half pack per day, has diabetes which is well controlled with hemoglobin A1c 6.0  cholesterol is also well controlled, 150 range  Today he continues to have significant fatigue. He reports that it is getting worse. After he walks to get his mail and back to the house, just gives out. Feels that there's something wrong.  He does not think it is from just COPD. Denies any leg edema.  He does report having shortness of breath with exertion. He does not feel that he can treadmill given his profound symptoms. He has tried treadmill in the past.   Reports that he sleeps relatively well.  He does not exercise on a regular basis.  Recent lab work shows normal total testosterone, low free testosterone Also reports having anemia in the past. No recent CBC  EKG shows normal sinus rhythm with rate 91 beats per minute, nonspecific ST abnormality in the anterolateral leads   Past Medical History . Hyperlipidemia 08/23/1997 . Allergy 2002 . Hypertension 1980 . Anxiety 08/24/1999 . Depression 08/24/1999 . Diabetes mellitus 08/24/2003 . Enteritis 10/18/2004  CT abd. Enteritis//CT pelvis Sacroiliac Spurring . Sigmoid polyp 07/03/2007  colonoscopy Small internal hemorrhoids; Sigmoid polyp B9 (Dr. Elliott)//colonoscopy normal 04/06/2004 . CAD,  Nonobstructive by cardiac catheterization 5/04: Proximal RCA 25%, EF 65%;  Myoview 12/05: no scar or ischemia, EF 58% . Sleep apnea  . BPH (benign prostatic hyperplasia)  . Paroxysmal SVT (supraventricular tachycardia) . Tobacco abuse    Past Surgical History . Cardiac  catheterization 01/14/2003   Min plaque EF65%// Cath normal (Dr. Juliann Pares 2000)//ECHO Mild AAA TrMR Ef61% 05/30/2002 (Dr.Callwood) . Gastric fundoplication [facility] . Hernia repair 01/02/2006   right ventral Hernia Repair Laparascopic Leretha Pol . 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 Sleep study 03/2004    Sleep Study Hypopneas 60/hr ; Kicks 160/hr . Ankle fracture surgery 02/2003 . Carpal tunnel release  . Back surgery  . Cholecystectomy  . Appendectomy      Outpatient Encounter Prescriptions as of 04/06/2013  Medication Sig Dispense Refill  . albuterol (PROVENTIL HFA;VENTOLIN HFA) 108 (90 BASE) MCG/ACT inhaler Inhale 2 puffs into the lungs every 6 (six) hours as needed for wheezing.  1 Inhaler  5  . ALPRAZolam (XANAX) 0.5 MG tablet TAKE ONE TABLET BY MOUTH TWICE DAILY AS NEEDED FOR  SLEEP  60 tablet  2  . budesonide-formoterol (SYMBICORT) 160-4.5 MCG/ACT inhaler Inhale 1 puff into the lungs 2 (two) times daily. Rinse after use  1 Inhaler  12  . calcium elemental as carbonate (CALCIUM ANTACID ULTRA) 400 MG tablet Chew 1,000 mg by mouth daily.        . cyclobenzaprine (FLEXERIL) 10 MG tablet TAKE ONE TABLET BY MOUTH THREE TIMES DAILY AS NEEDED FOR MUSCLE SPASM  90 tablet  2  . doxazosin (CARDURA) 4 MG tablet Take 1 tablet (4 mg total) by mouth at bedtime.  90 tablet  3  . fexofenadine (ALLEGRA) 180 MG tablet Take 180 mg by mouth daily.        . finasteride (PROSCAR) 5 MG tablet TAKE ONE TABLET BY MOUTH EVERY DAY  90 tablet  2  . FLUoxetine (PROZAC) 40 MG capsule Take 1 capsule (40 mg total) by mouth daily.  90 capsule  3  . glucose blood (BAYER CONTOUR TEST) test strip Test blood sugar once a day and as directed. Dx 250.00  50 each  5  . guaifenesin (HUMIBID E) 400 MG TABS Take 400 mg by mouth every 6 (six) hours as needed.        Marland Kitchen lisinopril (PRINIVIL,ZESTRIL) 20 MG tablet Take 1 tablet (20 mg total) by mouth daily.  90 tablet  3  . meloxicam (MOBIC) 15 MG tablet TAKE ONE TABLET BY  MOUTH ONE TIME DAILY WITH FOOD  30 tablet  4  . metFORMIN (GLUCOPHAGE) 500 MG tablet Take 1 tablet (500 mg total) by mouth daily with breakfast.      . MICROLET LANCETS MISC USE AS INSTRUCTED TO CHECK BLOOD SUGAR ONCE DAILY  100 each  2  . Multiple Vitamin (MULTIVITAMIN) tablet Take 1 tablet by mouth daily.        . potassium chloride (KLOR-CON 10) 10 MEQ tablet TAKE ONE TABLET BY MOUTH EVERY DAY  90 tablet  0  . rOPINIRole (REQUIP) 1 MG tablet TAKE  ONE TABLET BY MOUTH NIGHTLY AT BEDTIME  30 tablet  10  . simvastatin (ZOCOR) 20 MG tablet TAKE ONE TABLET BY MOUTH EVERY DAY  90 tablet  2  . zolpidem (AMBIEN) 10 MG tablet Take 1 tablet (10 mg total) by mouth at bedtime as needed for sleep.  30 tablet  5    Review of Systems  Constitutional: Positive for fatigue.  HENT: Negative.   Eyes: Negative.   Respiratory: Positive for shortness of breath.   Cardiovascular: Negative.   Gastrointestinal: Negative.   Musculoskeletal: Negative.   Skin: Negative.   Psychiatric/Behavioral: Negative.   All other systems reviewed and are negative.    BP 110/80  Pulse 91  Ht 5\' 11"  (1.803 m)  Wt 230 lb 12 oz (104.668 kg)  BMI 32.2 kg/m2  Physical Exam  Nursing note and vitals reviewed. Constitutional: He is oriented to person, place, and time. He appears well-developed and well-nourished.  HENT:  Head: Normocephalic.  Nose: Nose normal.  Mouth/Throat: Oropharynx is clear and moist.  Eyes: Conjunctivae are normal. Pupils are equal, round, and reactive to light.  Neck: Normal range of motion. Neck supple. No JVD present.  Cardiovascular: Normal rate, regular rhythm, S1 normal, S2 normal, normal heart sounds and intact distal pulses.  Exam reveals no gallop and no friction rub.   No murmur heard. Pulmonary/Chest: Effort normal and breath sounds normal. No respiratory distress. He has no wheezes. He has no rales. He exhibits no tenderness.  Abdominal: Soft. Bowel sounds are normal. He exhibits no  distension. There is no tenderness.  Musculoskeletal: Normal range of motion. He exhibits no edema and no tenderness.  Lymphadenopathy:    He has no cervical adenopathy.  Neurological: He is alert and oriented to person, place, and time. Coordination normal.  Skin: Skin is warm and dry. No rash noted. No erythema.  Psychiatric: He has a normal mood and affect. His behavior is normal. Judgment and thought content normal.      Assessment and Plan

## 2013-04-06 NOTE — Assessment & Plan Note (Signed)
Blood pressure is elevated at home. He brings his blood pressure cuff with him today, all numbers are elevated including diastolic. We'll increase lisinopril back to 20 mg daily at his request. If blood pressure continues to run high, he could increase his Cardura to 4 mg daily

## 2013-04-06 NOTE — Assessment & Plan Note (Signed)
If stress test is normal, need to consider depression as a cause of his fatigue. Also has low testosterone

## 2013-04-07 LAB — CBC
HCT: 38.9 % (ref 37.5–51.0)
Hemoglobin: 12.7 g/dL (ref 12.6–17.7)
MCV: 78 fL — ABNORMAL LOW (ref 79–97)
WBC: 9.1 10*3/uL (ref 3.4–10.8)

## 2013-04-11 ENCOUNTER — Ambulatory Visit: Payer: Self-pay | Admitting: Cardiovascular Disease

## 2013-04-11 DIAGNOSIS — R079 Chest pain, unspecified: Secondary | ICD-10-CM

## 2013-04-12 ENCOUNTER — Other Ambulatory Visit: Payer: Self-pay

## 2013-04-12 DIAGNOSIS — R0602 Shortness of breath: Secondary | ICD-10-CM

## 2013-04-12 DIAGNOSIS — R079 Chest pain, unspecified: Secondary | ICD-10-CM

## 2013-04-12 DIAGNOSIS — R5383 Other fatigue: Secondary | ICD-10-CM

## 2013-04-12 DIAGNOSIS — I2510 Atherosclerotic heart disease of native coronary artery without angina pectoris: Secondary | ICD-10-CM

## 2013-04-13 ENCOUNTER — Telehealth: Payer: Self-pay

## 2013-04-13 NOTE — Telephone Encounter (Signed)
Pt would like stress test results.  

## 2013-04-16 NOTE — Telephone Encounter (Signed)
No significant ischemia noted on stress test There was some stomach artifact, could not see all areas well. Heart function was mildly depressed, 44% (normal >55) Stress test can make errors when estimating cardiac function number  If sx are still significant could perform cardiac cath  Otherwise continue medical management

## 2013-04-16 NOTE — Telephone Encounter (Signed)
LMTRC on answering machine.

## 2013-04-17 NOTE — Telephone Encounter (Signed)
Called spoke with pt advised of results per Dr Mariah Milling.  Pt states he is not having any problems or significant symptoms at the present.  Advised pt to continue on present medications and treatment regimen and call back if develops worsening symptoms or problems for further evaluation or medication changes.  Pt verbalized understanding.

## 2013-05-17 ENCOUNTER — Other Ambulatory Visit: Payer: Self-pay | Admitting: Family Medicine

## 2013-05-21 ENCOUNTER — Other Ambulatory Visit: Payer: Self-pay | Admitting: Family Medicine

## 2013-05-21 NOTE — Telephone Encounter (Signed)
Please call in xanax and ambien.

## 2013-05-21 NOTE — Telephone Encounter (Signed)
Electronic refill request.  Please advise. 

## 2013-05-22 ENCOUNTER — Other Ambulatory Visit: Payer: Self-pay | Admitting: Family Medicine

## 2013-05-22 NOTE — Telephone Encounter (Signed)
Medication phoned to pharmacy.  

## 2013-05-23 NOTE — Telephone Encounter (Signed)
Ok to refill 

## 2013-05-23 NOTE — Telephone Encounter (Signed)
Looks like they were already done.

## 2013-06-12 ENCOUNTER — Ambulatory Visit (INDEPENDENT_AMBULATORY_CARE_PROVIDER_SITE_OTHER): Payer: Medicare Other

## 2013-06-12 DIAGNOSIS — Z23 Encounter for immunization: Secondary | ICD-10-CM

## 2013-06-15 ENCOUNTER — Other Ambulatory Visit: Payer: Self-pay | Admitting: Family Medicine

## 2013-07-16 ENCOUNTER — Other Ambulatory Visit: Payer: Self-pay | Admitting: Family Medicine

## 2013-07-24 ENCOUNTER — Encounter: Payer: Self-pay | Admitting: Radiology

## 2013-07-25 ENCOUNTER — Ambulatory Visit (INDEPENDENT_AMBULATORY_CARE_PROVIDER_SITE_OTHER)
Admission: RE | Admit: 2013-07-25 | Discharge: 2013-07-25 | Disposition: A | Discharge status: Home / Self Care | Payer: Medicare Other | Source: Ambulatory Visit | Attending: Family Medicine | Admitting: Family Medicine

## 2013-07-25 ENCOUNTER — Ambulatory Visit (INDEPENDENT_AMBULATORY_CARE_PROVIDER_SITE_OTHER): Payer: Medicare Other | Admitting: Family Medicine

## 2013-07-25 ENCOUNTER — Encounter: Payer: Self-pay | Admitting: Family Medicine

## 2013-07-25 ENCOUNTER — Encounter (INDEPENDENT_AMBULATORY_CARE_PROVIDER_SITE_OTHER): Payer: Self-pay

## 2013-07-25 VITALS — BP 136/90 | HR 92 | Temp 98.3°F | Wt 234.0 lb

## 2013-07-25 DIAGNOSIS — G4700 Insomnia, unspecified: Secondary | ICD-10-CM

## 2013-07-25 DIAGNOSIS — J449 Chronic obstructive pulmonary disease, unspecified: Secondary | ICD-10-CM

## 2013-07-25 DIAGNOSIS — R05 Cough: Secondary | ICD-10-CM

## 2013-07-25 DIAGNOSIS — R059 Cough, unspecified: Secondary | ICD-10-CM

## 2013-07-25 DIAGNOSIS — E119 Type 2 diabetes mellitus without complications: Secondary | ICD-10-CM

## 2013-07-25 NOTE — Patient Instructions (Signed)
Go to the lab on the way out.  We'll contact you with your lab report. We may need to change your inhalers around or add an extra one.   Try to quit smoking as soon as possible.   I'll work on the medicines for [employer].

## 2013-07-25 NOTE — Progress Notes (Signed)
Pre-visit discussion using our clinic review tool. No additional management support is needed unless otherwise documented below in the visit note.  Dry hacking cough.  Smoking less. Going on for about 1 month.  Not getting better.  Inhalers help only a little. Worse supine.  Can walk less distance than a year ago, but still more than a few hundred feet.  Some wheeze.  No swelling in the ankles.  No heartburn.  No chest pain.  This isn't a typical cough that he gets with a cold.  No sputum.  No fevers.   On ACE.   Sugar has been ~115 in AM on 1 metformin a day.  This is improved.  Needs from filled out for DM2 meter.   Insomnia.  copay on Remus Loffler is going up. Asking about other options.   Meds, vitals, and allergies reviewed.   ROS: See HPI.  Otherwise, noncontributory.  GEN: nad, alert and oriented HEENT: mucous membranes moist NECK: supple w/o LA CV: rrr.   PULM: ctab except for scant ext wheeze (no SABA use today), no inc wob ABD: soft, +bs EXT: no edema SKIN: no acute rash

## 2013-07-26 ENCOUNTER — Encounter: Payer: Self-pay | Admitting: Family Medicine

## 2013-07-26 MED ORDER — TRAZODONE HCL 50 MG PO TABS: 25.0000 mg | ORAL_TABLET | Freq: Every evening | ORAL | Status: DC | PRN

## 2013-07-26 MED ORDER — TIOTROPIUM BROMIDE MONOHYDRATE 18 MCG IN CAPS: 18.0000 ug | ORAL_CAPSULE | Freq: Every day | RESPIRATORY_TRACT | Status: DC

## 2013-07-26 NOTE — Assessment & Plan Note (Signed)
See notes on cxr. Likely cause of cough.  Add on spiriva and have patient report back.  Okay for outpatient f/u.  Would like to avoid oral steroids.  dw pt.  He agrees. CXR reviewed.

## 2013-07-26 NOTE — Assessment & Plan Note (Signed)
Trazodone trial reasonable rx sent.

## 2013-07-26 NOTE — Assessment & Plan Note (Signed)
reasonable control recently on home checks.

## 2013-07-30 ENCOUNTER — Other Ambulatory Visit: Payer: Self-pay | Admitting: *Deleted

## 2013-07-30 MED ORDER — ONETOUCH DELICA LANCETS FINE MISC: Status: DC

## 2013-07-31 ENCOUNTER — Other Ambulatory Visit: Payer: Self-pay | Admitting: Family Medicine

## 2013-07-31 MED ORDER — ONETOUCH DELICA LANCETS FINE MISC: Status: DC

## 2013-07-31 NOTE — Telephone Encounter (Signed)
Pharmacy requesting RX be sent again with dx code and more detailed instructions.

## 2013-08-17 ENCOUNTER — Other Ambulatory Visit: Payer: Self-pay | Admitting: Family Medicine

## 2013-08-17 NOTE — Telephone Encounter (Signed)
OK to fill Fluoxetine?

## 2013-08-17 NOTE — Telephone Encounter (Signed)
Instructions given to pt

## 2013-08-17 NOTE — Telephone Encounter (Signed)
Sent.  Schedule CPE for ~12/2013.  Thanks.

## 2013-08-20 ENCOUNTER — Other Ambulatory Visit: Payer: Self-pay | Admitting: Family Medicine

## 2013-08-20 NOTE — Telephone Encounter (Signed)
Please call in.  Thanks.   

## 2013-08-20 NOTE — Telephone Encounter (Signed)
Electronic refill request.  Please advise. 

## 2013-08-20 NOTE — Telephone Encounter (Signed)
Medication phoned to pharmacy.  

## 2013-08-21 NOTE — Telephone Encounter (Signed)
Electronic refill request.  Please advise. 

## 2013-08-21 NOTE — Telephone Encounter (Signed)
Pt called to ck on status of alprazolam refill; spoke with [facility] [address] and rx ready for pick up. Pt notified.

## 2013-08-22 ENCOUNTER — Encounter: Payer: Self-pay | Admitting: Family Medicine

## 2013-08-23 HISTORY — PX: CARDIAC CATHETERIZATION: SHX172

## 2013-08-24 ENCOUNTER — Other Ambulatory Visit: Payer: Self-pay | Admitting: Family Medicine

## 2013-08-24 NOTE — Telephone Encounter (Signed)
Dr. Damita Dunnings out of office, please advise

## 2013-08-26 NOTE — Telephone Encounter (Signed)
Refill times one in his absence

## 2013-08-27 NOTE — Telephone Encounter (Signed)
done

## 2013-08-29 ENCOUNTER — Encounter: Payer: Self-pay | Admitting: *Deleted

## 2013-08-29 NOTE — Telephone Encounter (Signed)
Encounter opened in error, pt is already using preferred meter

## 2013-08-29 NOTE — Telephone Encounter (Deleted)
Received letter from [facility] that pt's glucometer and test strips brand is no longer covered. The covered alternative ( provided at no additional cost to the patient) is either the OneTouch or Accu-Chek glucometer's and test strips. Is it ok to send in new rx?

## 2013-09-22 ENCOUNTER — Other Ambulatory Visit: Payer: Self-pay | Admitting: Family Medicine

## 2013-10-03 ENCOUNTER — Ambulatory Visit: Payer: Medicare Other | Admitting: Cardiovascular Disease

## 2013-10-07 ENCOUNTER — Other Ambulatory Visit: Payer: Self-pay | Admitting: Family Medicine

## 2013-10-08 NOTE — Telephone Encounter (Signed)
Electronic refill request. Filled #90 on 05/21/13 with 2 RF's. Please advise.

## 2013-10-08 NOTE — Telephone Encounter (Signed)
Sent!

## 2013-10-22 ENCOUNTER — Encounter: Payer: Self-pay | Admitting: Cardiovascular Disease

## 2013-10-22 ENCOUNTER — Ambulatory Visit (INDEPENDENT_AMBULATORY_CARE_PROVIDER_SITE_OTHER): Payer: Medicare Other | Admitting: Cardiovascular Disease

## 2013-10-22 VITALS — BP 120/68 | HR 113 | Ht 71.0 in | Wt 241.2 lb

## 2013-10-22 DIAGNOSIS — F17200 Nicotine dependence, unspecified, uncomplicated: Secondary | ICD-10-CM

## 2013-10-22 DIAGNOSIS — I498 Other specified cardiac arrhythmias: Secondary | ICD-10-CM

## 2013-10-22 DIAGNOSIS — R000 Tachycardia, unspecified: Secondary | ICD-10-CM | POA: Insufficient documentation

## 2013-10-22 DIAGNOSIS — E785 Hyperlipidemia, unspecified: Secondary | ICD-10-CM

## 2013-10-22 DIAGNOSIS — Z720 Tobacco use: Secondary | ICD-10-CM

## 2013-10-22 DIAGNOSIS — I10 Essential (primary) hypertension: Secondary | ICD-10-CM

## 2013-10-22 DIAGNOSIS — I2510 Atherosclerotic heart disease of native coronary artery without angina pectoris: Secondary | ICD-10-CM

## 2013-10-22 DIAGNOSIS — E119 Type 2 diabetes mellitus without complications: Secondary | ICD-10-CM

## 2013-10-22 MED ORDER — METOPROLOL TARTRATE 25 MG PO TABS: 25.0000 mg | ORAL_TABLET | Freq: Two times a day (BID) | ORAL | Status: DC

## 2013-10-22 NOTE — Assessment & Plan Note (Signed)
Cholesterol is at goal on the current lipid regimen. No changes to the medications were made.  

## 2013-10-22 NOTE — Assessment & Plan Note (Signed)
We have encouraged him to continue to work on weaning his cigarettes and smoking cessation. He will continue to work on this and does not want any assistance with chantix.  

## 2013-10-22 NOTE — Progress Notes (Signed)
Patient ID: Mark Morris, male    DOB: 1951/04/22, 63 y.o.   MRN: [id number]  HPI Comments: [name] is a 63 year-old gentleman with history of mild CAD in 2004, Myoview in 2005 showing no significant ischemia, chronic fatigue and poor energy for the past 4-5 years, history of SVT requiring adenosine, who presents for routine followup.  He continues to smoke one half pack per day, has diabetes which is well controlled with hemoglobin A1c 6.0  cholesterol is also well controlled, 150 range  He has had previous symptoms of fatigue Prior stress test showing no ischemia August 2014 Biggest complaint on today's visit is chronic back pain also carpal tunnel on the left Does not exercise, weight continues to be a problem He does report heart rate is generally always elevated  Reports that he sleeps relatively well.  He does not exercise on a regular basis.  Recent lab work shows normal total testosterone, low free testosterone Also reports having anemia in the past. No recent CBC  EKG shows normal sinus rhythm with rate 113 beats per minute, nonspecific ST abnormality in the anterolateral leads   Past Medical History . Hyperlipidemia  . Allergy  . Hypertension  . Anxiety  . Depression  . Diabetes mellitus  . Enteritis 10/18/2004  CT abd. Enteritis//CT pelvis Sacroiliac Spurring . Sigmoid polyp 07/03/2007  colonoscopy Small internal hemorrhoids; Sigmoid polyp B9 (Dr. Elliott)//colonoscopy normal 04/06/2004 . CAD,  Nonobstructive by cardiac catheterization 5/04: Proximal RCA 25%, EF 65%;  Myoview 12/05: no scar or ischemia, EF 58% . Sleep apnea  . BPH (benign prostatic hyperplasia)  . Paroxysmal SVT (supraventricular tachycardia) . Tobacco abuse    Past Surgical History . Cardiac catheterization 01/14/2003   Min plaque EF65%// Cath normal (Dr. Clayborn Bigness 2000)//ECHO Mild AAA TrMR Ef61% 05/30/2002 (Dr.Callwood) . Gastric fundoplication 6503     [facility] . Hernia  repair 01/02/2006   right ventral Hernia Repair Laparascopic [place] Pai . 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 Sleep study 03/2004    Sleep Study Hypopneas 60/hr ; Kicks 160/hr . Ankle fracture surgery 02/2003 . Carpal tunnel release  . Back surgery  . Cholecystectomy  . Appendectomy     Outpatient Encounter Prescriptions as of 10/22/2013  Medication Sig  . albuterol (PROVENTIL HFA;VENTOLIN HFA) 108 (90 BASE) MCG/ACT inhaler Inhale 2 puffs into the lungs every 6 (six) hours as needed for wheezing.  Marland Kitchen ALPRAZolam (XANAX) 0.5 MG tablet TAKE ONE TABLET BY MOUTH TWICE DAILY AS NEEDED FOR SLEEP OR ANXIETY  . Blood Glucose Monitoring Suppl (ONE TOUCH ULTRA 2) W/DEVICE KIT Test blood sugar once daily as directed.  Diagnosis:  250.00  Non insulin-dependent.  . budesonide-formoterol (SYMBICORT) 160-4.5 MCG/ACT inhaler Inhale 1 puff into the lungs 2 (two) times daily. Rinse after use  . calcium elemental as carbonate (CALCIUM ANTACID ULTRA) 400 MG tablet Chew 1,000 mg by mouth daily.    . cyclobenzaprine (FLEXERIL) 10 MG tablet TAKE ONE TABLET BY MOUTH THREE TIMES DAILY AS NEEDED FOR MUSCLE SPASM  . doxazosin (CARDURA) 2 MG tablet TAKE ONE TABLET BY MOUTH ONCE DAILY  . doxazosin (CARDURA) 4 MG tablet Take 1 tablet (4 mg total) by mouth at bedtime.  . fexofenadine (ALLEGRA) 180 MG tablet Take 180 mg by mouth daily.    . finasteride (PROSCAR) 5 MG tablet TAKE ONE TABLET BY MOUTH ONCE DAILY  . FLUoxetine (PROZAC) 40 MG capsule TAKE ONE CAPSULE BY MOUTH ONCE DAILY  . glucose blood (ONE TOUCH ULTRA TEST) test strip Use  as instructed to test blood sugar once daily.  Diagnosis:  250.00  Non insulin-dependent.  Marland Kitchen guaifenesin (HUMIBID E) 400 MG TABS Take 400 mg by mouth every 6 (six) hours as needed.    Marland Kitchen lisinopril (PRINIVIL,ZESTRIL) 20 MG tablet Take 1 tablet (20 mg total) by mouth daily.  . meloxicam (MOBIC) 15 MG tablet TAKE ONE TABLET BY MOUTH ONCE DAILY WITH FOOD  . metFORMIN (GLUCOPHAGE) 500 MG tablet TAKE ONE TABLET  BY MOUTH ONCE DAILY  . Multiple Vitamin (MULTIVITAMIN) tablet Take 1 tablet by mouth daily.    Glory Rosebush DELICA LANCETS FINE MISC Use as instructed to check blood sugar once daily.  . potassium chloride (K-DUR) 10 MEQ tablet TAKE ONE TABLET BY MOUTH ONCE DAILY  . rOPINIRole (REQUIP) 1 MG tablet TAKE  ONE TABLET BY MOUTH NIGHTLY AT BEDTIME  . simvastatin (ZOCOR) 20 MG tablet TAKE ONE TABLET BY MOUTH ONCE DAILY  . tiotropium (SPIRIVA HANDIHALER) 18 MCG inhalation capsule Place 1 capsule (18 mcg total) into inhaler and inhale daily.  . traZODone (DESYREL) 50 MG tablet Take 0.5-1 tablets (25-50 mg total) by mouth at bedtime as needed for sleep.     Review of Systems  Constitutional: Positive for fatigue.  HENT: Negative.   Eyes: Negative.   Respiratory: Positive for shortness of breath.   Cardiovascular: Negative.   Gastrointestinal: Negative.   Endocrine: Negative.   Musculoskeletal: Positive for back pain.  Skin: Negative.   Allergic/Immunologic: Negative.   Neurological: Negative.   Hematological: Negative.   Psychiatric/Behavioral: Negative.   All other systems reviewed and are negative.    BP 100/68  Pulse 113  Ht _0  (1.803 m)  Wt 241 lb 4 oz (109.43 kg)  BMI 33.66 kg/m2  Physical Exam  Nursing note and vitals reviewed. Constitutional: He is oriented to person, place, and time. He appears well-developed and well-nourished.  Obese  HENT:  Head: Normocephalic.  Nose: Nose normal.  Mouth/Throat: Oropharynx is clear and moist.  Eyes: Conjunctivae are normal. Pupils are equal, round, and reactive to light.  Neck: Normal range of motion. Neck supple. No JVD present.  Cardiovascular: Normal rate, regular rhythm, S1 normal, S2 normal, normal heart sounds and intact distal pulses.  Exam reveals no gallop and no friction rub.   No murmur heard. Pulmonary/Chest: Effort normal and breath sounds normal. No respiratory distress. He has no wheezes. He has no rales. He exhibits  no tenderness.  Abdominal: Soft. Bowel sounds are normal. He exhibits no distension. There is no tenderness.  Musculoskeletal: Normal range of motion. He exhibits no edema and no tenderness.  Lymphadenopathy:    He has no cervical adenopathy.  Neurological: He is alert and oriented to person, place, and time. Coordination normal.  Skin: Skin is warm and dry. No rash noted. No erythema.  Psychiatric: He has a normal mood and affect. His behavior is normal. Judgment and thought content normal.      Assessment and Plan

## 2013-10-22 NOTE — Assessment & Plan Note (Addendum)
We will add metoprolol 25 mg twice a day, recommended he decrease the lisinopril down to 10 mg daily

## 2013-10-22 NOTE — Patient Instructions (Signed)
   Please start metoprolol twice a day for fast heart rate Cut the lisinopril in 1/2  Monitor the blood pressure  Please call us if you have new issues that need to be addressed before your next appt.  Your physician wants you to follow-up in: 3 months.  You will receive a reminder letter in the mail two months in advance. If you don't receive a letter, please call our office to schedule the follow-up appointment.

## 2013-10-22 NOTE — Assessment & Plan Note (Signed)
Currently with no symptoms of angina. No further workup at this time. Continue current medication regimen. Recent negative stress test August 2014

## 2013-10-22 NOTE — Assessment & Plan Note (Signed)
We'll start metoprolol 25 mg twice a day for heart rate control. We'll need to closely monitor him for bronchospasm and worsening COPD exacerbation on beta blockers

## 2013-10-22 NOTE — Assessment & Plan Note (Signed)
We have encouraged continued exercise, careful diet management in an effort to lose weight. 

## 2013-11-02 ENCOUNTER — Other Ambulatory Visit: Payer: Self-pay | Admitting: Family Medicine

## 2013-11-14 ENCOUNTER — Other Ambulatory Visit: Payer: Self-pay | Admitting: Family Medicine

## 2013-11-15 NOTE — Telephone Encounter (Signed)
Medication phoned to pharmacy.  

## 2013-11-15 NOTE — Telephone Encounter (Signed)
Please call in.  Thanks.   

## 2013-11-18 ENCOUNTER — Other Ambulatory Visit: Payer: Self-pay | Admitting: Family Medicine

## 2013-12-20 ENCOUNTER — Other Ambulatory Visit: Payer: Self-pay | Admitting: Family Medicine

## 2013-12-20 NOTE — Telephone Encounter (Signed)
Please all in [place]. Thanks.

## 2013-12-20 NOTE — Telephone Encounter (Signed)
Electronic refill request

## 2013-12-20 NOTE — Telephone Encounter (Signed)
Medication phoned to pharmacy.  

## 2013-12-20 NOTE — Telephone Encounter (Signed)
Electronic refill request. Last CPE April 2014.  FU scheduled 01/01/14.  Please advise.

## 2014-01-01 ENCOUNTER — Other Ambulatory Visit: Payer: Self-pay | Admitting: Family Medicine

## 2014-01-01 ENCOUNTER — Encounter: Payer: Self-pay | Admitting: Family Medicine

## 2014-01-01 ENCOUNTER — Ambulatory Visit (INDEPENDENT_AMBULATORY_CARE_PROVIDER_SITE_OTHER): Payer: Medicare Other | Admitting: Family Medicine

## 2014-01-01 ENCOUNTER — Telehealth: Payer: Self-pay | Admitting: Family Medicine

## 2014-01-01 ENCOUNTER — Ambulatory Visit: Payer: Medicare Other | Admitting: Family Medicine

## 2014-01-01 VITALS — BP 120/70 | HR 84 | Temp 98.4°F | Wt 243.0 lb

## 2014-01-01 DIAGNOSIS — J449 Chronic obstructive pulmonary disease, unspecified: Secondary | ICD-10-CM

## 2014-01-01 DIAGNOSIS — F17200 Nicotine dependence, unspecified, uncomplicated: Secondary | ICD-10-CM

## 2014-01-01 DIAGNOSIS — G4700 Insomnia, unspecified: Secondary | ICD-10-CM

## 2014-01-01 DIAGNOSIS — Z720 Tobacco use: Secondary | ICD-10-CM

## 2014-01-01 DIAGNOSIS — E119 Type 2 diabetes mellitus without complications: Secondary | ICD-10-CM

## 2014-01-01 LAB — HEMOGLOBIN A1C: Hgb A1c MFr Bld: 6 % (ref 4.6–6.5)

## 2014-01-01 MED ORDER — ROPINIROLE HCL 1 MG PO TABS: ORAL_TABLET | ORAL | Status: DC

## 2014-01-01 MED ORDER — FLUOXETINE HCL 40 MG PO CAPS: ORAL_CAPSULE | ORAL | Status: DC

## 2014-01-01 MED ORDER — TRAZODONE HCL 50 MG PO TABS: ORAL_TABLET | ORAL | Status: DC

## 2014-01-01 NOTE — Patient Instructions (Signed)
Schedule a physical for later in the year, in about 4 months.  Go to the lab on the way out.  We'll contact you with your lab report. Take care.  Glad to see you.

## 2014-01-01 NOTE — Telephone Encounter (Signed)
Relevant patient education mailed to patient.  
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
## 2014-01-02 ENCOUNTER — Encounter: Payer: Self-pay | Admitting: Family Medicine

## 2014-01-02 NOTE — Assessment & Plan Note (Signed)
D/w pt about diet and weight.  See notes on labs.  Normal foot exam.

## 2014-01-02 NOTE — Assessment & Plan Note (Signed)
Encouraged cessation.

## 2014-01-02 NOTE — Assessment & Plan Note (Signed)
Is improved per patient report with spiriva added back on.  ctab today.

## 2014-01-02 NOTE — Assessment & Plan Note (Signed)
Trazodone helps some and he is going to continue with that

## 2014-01-03 ENCOUNTER — Encounter: Payer: Self-pay | Admitting: *Deleted

## 2014-01-22 ENCOUNTER — Ambulatory Visit (INDEPENDENT_AMBULATORY_CARE_PROVIDER_SITE_OTHER): Payer: Medicare Other | Admitting: Cardiovascular Disease

## 2014-01-22 ENCOUNTER — Encounter: Payer: Self-pay | Admitting: Cardiovascular Disease

## 2014-01-22 VITALS — BP 130/80 | HR 78 | Ht 71.0 in | Wt 236.5 lb

## 2014-01-22 DIAGNOSIS — F17200 Nicotine dependence, unspecified, uncomplicated: Secondary | ICD-10-CM

## 2014-01-22 DIAGNOSIS — I471 Supraventricular tachycardia: Secondary | ICD-10-CM

## 2014-01-22 DIAGNOSIS — I10 Essential (primary) hypertension: Secondary | ICD-10-CM

## 2014-01-22 DIAGNOSIS — R000 Tachycardia, unspecified: Secondary | ICD-10-CM

## 2014-01-22 DIAGNOSIS — J449 Chronic obstructive pulmonary disease, unspecified: Secondary | ICD-10-CM

## 2014-01-22 DIAGNOSIS — Z720 Tobacco use: Secondary | ICD-10-CM

## 2014-01-22 DIAGNOSIS — I498 Other specified cardiac arrhythmias: Secondary | ICD-10-CM

## 2014-01-22 DIAGNOSIS — I2510 Atherosclerotic heart disease of native coronary artery without angina pectoris: Secondary | ICD-10-CM

## 2014-01-22 DIAGNOSIS — E119 Type 2 diabetes mellitus without complications: Secondary | ICD-10-CM

## 2014-01-22 NOTE — Assessment & Plan Note (Signed)
Heart rate  dramatically improved on metoprolol 25 mg twice a day.

## 2014-01-22 NOTE — Patient Instructions (Addendum)
You are doing well. No medication changes were made.  Please monitor your blood pressure as you are doing  Please call us if you have new issues that need to be addressed before your next appt.  Your physician wants you to follow-up in: 6 months.  You will receive a reminder letter in the mail two months in advance. If you don't receive a letter, please call our office to schedule the follow-up appointment.

## 2014-01-22 NOTE — Assessment & Plan Note (Signed)
Currently with no symptoms of angina. EKG is different on today's visit. He denies having any symptoms. We have suggested he come in in one or 2 weeks for repeat EKG. If this continues to show dramatic changes, would probably recommend a repeat stress test. Continue current medication regimen for now. He does have risk factors as he is a smoker.

## 2014-01-22 NOTE — Assessment & Plan Note (Signed)
Recommended smoking cessation. 

## 2014-01-22 NOTE — Assessment & Plan Note (Signed)
He denies any further episodes of tachycardia.

## 2014-01-22 NOTE — Assessment & Plan Note (Signed)
We'll continue lisinopril 20 mg daily, metoprolol. No medication changes made. Suggested he monitor his blood pressure

## 2014-01-22 NOTE — Assessment & Plan Note (Signed)
We have encouraged continued exercise, careful diet management in an effort to lose weight. 

## 2014-01-22 NOTE — Progress Notes (Signed)
Patient ID: Mark Morris, male    DOB: Sep 09, 1950, 63 y.o.   MRN: [id number]  HPI Comments: Mr. Mark Morris is a 63 year-old gentleman with history of mild CAD in 2004, Myoview in 2005 showing no significant ischemia, chronic fatigue and poor energy for the past 4-5 years, history of SVT requiring adenosine, who presents for routine followup. He has chronic back pain lower as well as upper. He is a smoker  On his last clinic visit, he was started on metoprolol 25 mg twice a day for tachycardia. ACE inhibitor dose was decreased in half. He reports that blood pressure was running high on this regimen and he increase the ACE inhibitor back to the 20 mg dosing. On this his blood pressure has been relatively well-controlled. Occasionally diastolic of 90, mostly 71I and 80s. Systolics in the 458K, occasionally 120s. He continues to smoke one half pack per day, diabetes previously well controlled with hemoglobin A1c 6.0  cholesterol is also well controlled, 150 range  He reports that he might need back surgery.  previous symptoms of fatigue Prior stress test showing no ischemia August 2014 Since his last clinic visit, he has had carpal tunnel surgery on the left Does not exercise, weight continues to be a problem Reports that he sleeps relatively well.  He does not exercise on a regular basis.   lab work shows normal total testosterone, low free testosterone Also reports having anemia in the past. No recent CBC  EKG shows normal sinus rhythm with rate 78 beats per minute, nonspecific ST abnormality in the anterolateral leads, inferior leads ST and T wave abnormality more notable compared to prior EKGs   Past Medical History . Hyperlipidemia  . Allergy  . Hypertension  . Anxiety  . Depression  . Diabetes mellitus  . Enteritis 10/18/2004  CT abd. Enteritis//CT pelvis Sacroiliac Spurring . Sigmoid polyp 07/03/2007  colonoscopy Small internal hemorrhoids; Sigmoid polyp B9 (Dr.  Elliott)//colonoscopy normal 04/06/2004 . CAD,  Nonobstructive by cardiac catheterization 5/04: Proximal RCA 25%, EF 65%;  Myoview 12/05: no scar or ischemia, EF 58% . Sleep apnea  . BPH (benign prostatic hyperplasia)  . Paroxysmal SVT (supraventricular tachycardia) . Tobacco abuse    Past Surgical History . Cardiac catheterization 01/14/2003   Min plaque EF65%// Cath normal (Dr. Clayborn Bigness 2000)//ECHO Mild AAA TrMR Ef61% 05/30/2002 (Dr.Callwood) . Gastric fundoplication 9983     [facility] . Hernia repair 01/02/2006   right ventral Hernia Repair Laparascopic [place] Pai . 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 Sleep study 03/2004    Sleep Study Hypopneas 60/hr ; Kicks 160/hr . Ankle fracture surgery 02/2003 . Carpal tunnel release  . Back surgery  . Cholecystectomy  . Appendectomy     Outpatient Encounter Prescriptions as of 61/09/2013  Medication Sig  . ALPRAZolam (XANAX) 0.5 MG tablet TAKE ONE TABLET BY MOUTH TWICE DAILY AS NEEDED  . budesonide-formoterol (SYMBICORT) 160-4.5 MCG/ACT inhaler Inhale 1 puff into the lungs 2 (two) times daily. Rinse after use  . calcium elemental as carbonate (CALCIUM ANTACID ULTRA) 400 MG tablet Chew 1,000 mg by mouth daily.    . cyclobenzaprine (FLEXERIL) 10 MG tablet TAKE ONE TABLET BY MOUTH THREE TIMES DAILY AS NEEDED FOR MUSCLE SPASM  . doxazosin (CARDURA) 4 MG tablet Take 1 tablet (4 mg total) by mouth at bedtime.  . fexofenadine (ALLEGRA) 180 MG tablet Take 180 mg by mouth daily.    . finasteride (PROSCAR) 5 MG tablet TAKE ONE TABLET BY MOUTH ONCE DAILY  . FLUoxetine (PROZAC)  40 MG capsule TAKE ONE CAPSULE BY MOUTH ONCE DAILY  . glucose blood (ONE TOUCH ULTRA TEST) test strip Use as instructed to test blood sugar once daily.  Diagnosis:  250.00  Non insulin-dependent.  Marland Kitchen guaifenesin (HUMIBID E) 400 MG TABS Take 400 mg by mouth every 6 (six) hours as needed.    Marland Kitchen lisinopril (PRINIVIL,ZESTRIL) 20 MG tablet Take 1 tablet (20 mg total) by mouth daily.  .  meloxicam (MOBIC) 15 MG tablet TAKE ONE TABLET BY MOUTH ONCE DAILY WITH FOOD  . metFORMIN (GLUCOPHAGE) 500 MG tablet TAKE ONE TABLET BY MOUTH TWICE DAILY  . metoprolol tartrate (LOPRESSOR) 25 MG tablet Take 1 tablet (25 mg total) by mouth 2 (two) times daily.  . Multiple Vitamin (MULTIVITAMIN) tablet Take 1 tablet by mouth daily.    Glory Rosebush DELICA LANCETS FINE MISC Use as instructed to check blood sugar once daily.  Marland Kitchen oxyCODONE-acetaminophen (PERCOCET/ROXICET) 5-325 MG per tablet   . potassium chloride (K-DUR) 10 MEQ tablet TAKE ONE TABLET BY MOUTH ONCE DAILY  . PROAIR HFA 108 (90 BASE) MCG/ACT inhaler INHALE TWO PUFFS BY MOUTH EVERY 6 HOURS AS NEEDED FOR WHEEZING  . rOPINIRole (REQUIP) 1 MG tablet TAKE ONE TABLET BY MOUTH IN THE EVENING AT BEDTIME  . simvastatin (ZOCOR) 20 MG tablet TAKE ONE TABLET BY MOUTH ONCE DAILY  . tiotropium (SPIRIVA HANDIHALER) 18 MCG inhalation capsule Place 1 capsule (18 mcg total) into inhaler and inhale daily.  . traZODone (DESYREL) 50 MG tablet TAKE ONE-HALF TO ONE TABLET BY MOUTH AT BEDTIME AS NEEDED FOR SLEEP     Review of Systems  HENT: Negative.   Eyes: Negative.   Cardiovascular: Negative.   Gastrointestinal: Negative.   Endocrine: Negative.   Musculoskeletal: Positive for back pain.  Skin: Negative.   Allergic/Immunologic: Negative.   Neurological: Negative.   Hematological: Negative.   Psychiatric/Behavioral: Negative.   All other systems reviewed and are negative.   BP 130/80  Ht 5\' 11"  (1.803 m)  Wt 236 lb 8 oz (107.276 kg)  BMI 33.00 kg/m2  Physical Exam  Nursing note and vitals reviewed. Constitutional: He is oriented to person, place, and time. He appears well-developed and well-nourished.  Obese  HENT:  Head: Normocephalic.  Nose: Nose normal.  Mouth/Throat: Oropharynx is clear and moist.  Eyes: Conjunctivae are normal. Pupils are equal, round, and reactive to light.  Neck: Normal range of motion. Neck supple. No JVD present.   Cardiovascular: Normal rate, regular rhythm, S1 normal, S2 normal, normal heart sounds and intact distal pulses.  Exam reveals no gallop and no friction rub.   No murmur heard. Pulmonary/Chest: Effort normal and breath sounds normal. No respiratory distress. He has no wheezes. He has no rales. He exhibits no tenderness.  Abdominal: Soft. Bowel sounds are normal. He exhibits no distension. There is no tenderness.  Musculoskeletal: Normal range of motion. He exhibits no edema and no tenderness.  Lymphadenopathy:    He has no cervical adenopathy.  Neurological: He is alert and oriented to person, place, and time. Coordination normal.  Skin: Skin is warm and dry. No rash noted. No erythema.  Psychiatric: He has a normal mood and affect. His behavior is normal. Judgment and thought content normal.      Assessment and Plan

## 2014-01-22 NOTE — Assessment & Plan Note (Signed)
We have encouraged him to continue to work on weaning his cigarettes and smoking cessation. He will continue to work on this and does not want any assistance with chantix.  

## 2014-01-31 ENCOUNTER — Telehealth: Payer: Self-pay | Admitting: *Deleted

## 2014-01-31 ENCOUNTER — Ambulatory Visit (INDEPENDENT_AMBULATORY_CARE_PROVIDER_SITE_OTHER): Payer: Medicare Other | Admitting: *Deleted

## 2014-01-31 VITALS — BP 118/88 | HR 69 | Ht 71.0 in | Wt 233.5 lb

## 2014-01-31 DIAGNOSIS — I471 Supraventricular tachycardia, unspecified: Secondary | ICD-10-CM

## 2014-01-31 DIAGNOSIS — I2510 Atherosclerotic heart disease of native coronary artery without angina pectoris: Secondary | ICD-10-CM

## 2014-01-31 DIAGNOSIS — R9431 Abnormal electrocardiogram [ECG] [EKG]: Secondary | ICD-10-CM

## 2014-01-31 NOTE — Telephone Encounter (Signed)
Informed patient that per Dr. Fletcher Anon his EKG from today has not changed  Dr. Rockey Situ might wish to move forward with a stress test  I informed patient that we will call next week with Dr. Gwenyth Ober recommendation Patient verbalized understanding

## 2014-02-03 NOTE — Telephone Encounter (Signed)
EKG still abn Would set up a lexiscan when I am in the hospital thx

## 2014-02-04 ENCOUNTER — Other Ambulatory Visit: Payer: Self-pay | Admitting: *Deleted

## 2014-02-04 ENCOUNTER — Other Ambulatory Visit: Payer: Self-pay

## 2014-02-04 ENCOUNTER — Encounter: Payer: Self-pay | Admitting: Cardiovascular Disease

## 2014-02-04 DIAGNOSIS — I2510 Atherosclerotic heart disease of native coronary artery without angina pectoris: Secondary | ICD-10-CM

## 2014-02-04 DIAGNOSIS — R9431 Abnormal electrocardiogram [ECG] [EKG]: Secondary | ICD-10-CM

## 2014-02-04 MED ORDER — METFORMIN HCL 500 MG PO TABS: ORAL_TABLET | ORAL | Status: DC

## 2014-02-04 NOTE — Addendum Note (Signed)
Addended by: Dede Query R on: 02/04/2014 02:45 PM   Modules accepted: Orders

## 2014-02-04 NOTE — Telephone Encounter (Signed)
Left message w/ pt's wife to have pt to call back.

## 2014-02-04 NOTE — Telephone Encounter (Signed)
Spoke w/ pt.  He is agreeable to scheduling lexiscan. Pt sched for 6//14/15 @ 8:00. Reviewed instructions w/ pt.  He verbalizes understanding.

## 2014-02-06 ENCOUNTER — Ambulatory Visit: Payer: Self-pay | Admitting: Cardiovascular Disease

## 2014-02-06 DIAGNOSIS — R9431 Abnormal electrocardiogram [ECG] [EKG]: Secondary | ICD-10-CM

## 2014-02-08 ENCOUNTER — Telehealth: Payer: Self-pay

## 2014-02-08 NOTE — Telephone Encounter (Signed)
Pt would like stress test results. Please call. 

## 2014-02-08 NOTE — Telephone Encounter (Signed)
His stress test does suggest blockage in the inferior part of the heart, possibly a total blockage with old damage Unable to exclude artifact on the stress test though most images suggesting inferior wall blockage  Other areas of the heart looks fine If he is asymptomatic, could treat this medically If he has symptoms such as chest pain or worsening shortness of breath with exertion, would proceed to cardiac catheterization  Definitely try to quit smoking

## 2014-02-11 ENCOUNTER — Other Ambulatory Visit: Payer: Self-pay | Admitting: Family Medicine

## 2014-02-11 ENCOUNTER — Telehealth: Payer: Self-pay

## 2014-02-11 NOTE — Telephone Encounter (Signed)
Reviewed results w/ pt.  He verbalizes understanding. He reports not further chest pain or SOBOE recently, but will call if sx return.

## 2014-02-11 NOTE — Telephone Encounter (Signed)
Spoke w/ pt this am re myoview results.  Reports he is asymptomatic, but would like to proceed w/ cath, next week if possible.

## 2014-02-11 NOTE — Telephone Encounter (Signed)
Pt called and wanted to know if he can have his heart cath next week. Please call and advise.

## 2014-02-11 NOTE — Telephone Encounter (Signed)
Left message for pt to call back  °

## 2014-02-11 NOTE — Telephone Encounter (Signed)
Would he like next week or after July 4 weekend

## 2014-02-12 ENCOUNTER — Other Ambulatory Visit: Payer: Self-pay

## 2014-02-12 DIAGNOSIS — Z01812 Encounter for preprocedural laboratory examination: Secondary | ICD-10-CM

## 2014-02-12 DIAGNOSIS — R9439 Abnormal result of other cardiovascular function study: Secondary | ICD-10-CM

## 2014-02-12 DIAGNOSIS — I25119 Atherosclerotic heart disease of native coronary artery with unspecified angina pectoris: Secondary | ICD-10-CM

## 2014-02-12 NOTE — Telephone Encounter (Signed)
Spoke w/ pt.  He is aware that cath is sched for 02/19/14 @ 7:30, [facility]. Pt sched for come in for labs 02/14/14 @ 9:30 and pick up written instructions for cath.

## 2014-02-14 ENCOUNTER — Ambulatory Visit (INDEPENDENT_AMBULATORY_CARE_PROVIDER_SITE_OTHER): Payer: Medicare Other

## 2014-02-14 DIAGNOSIS — I25119 Atherosclerotic heart disease of native coronary artery with unspecified angina pectoris: Secondary | ICD-10-CM

## 2014-02-14 DIAGNOSIS — R9439 Abnormal result of other cardiovascular function study: Secondary | ICD-10-CM

## 2014-02-14 DIAGNOSIS — I209 Angina pectoris, unspecified: Secondary | ICD-10-CM

## 2014-02-14 DIAGNOSIS — I2510 Atherosclerotic heart disease of native coronary artery without angina pectoris: Secondary | ICD-10-CM

## 2014-02-14 DIAGNOSIS — Z01812 Encounter for preprocedural laboratory examination: Secondary | ICD-10-CM

## 2014-02-15 ENCOUNTER — Encounter: Payer: Self-pay | Admitting: Cardiovascular Disease

## 2014-02-15 ENCOUNTER — Telehealth: Payer: Self-pay

## 2014-02-15 LAB — CBC WITH DIFFERENTIAL
Basophils Absolute: 0.1 10*3/uL (ref 0.0–0.2)
Basos: 1 %
Eos: 1 %
Eosinophils Absolute: 0.1 10*3/uL (ref 0.0–0.4)
HCT: 37.9 % (ref 37.5–51.0)
Hemoglobin: 12 g/dL — ABNORMAL LOW (ref 12.6–17.7)
IMMATURE GRANS (ABS): 0 10*3/uL (ref 0.0–0.1)
IMMATURE GRANULOCYTES: 0 %
Lymphocytes Absolute: 2.1 10*3/uL (ref 0.7–3.1)
Lymphs: 22 %
MCH: 24.1 pg — ABNORMAL LOW (ref 26.6–33.0)
MCHC: 31.7 g/dL (ref 31.5–35.7)
MCV: 76 fL — ABNORMAL LOW (ref 79–97)
Monocytes Absolute: 0.6 10*3/uL (ref 0.1–0.9)
Monocytes: 6 %
NEUTROS PCT: 70 %
Neutrophils Absolute: 6.4 10*3/uL (ref 1.4–7.0)
PLATELETS: 329 10*3/uL (ref 150–379)
RBC: 4.97 x10E6/uL (ref 4.14–5.80)
RDW: 17.2 % — ABNORMAL HIGH (ref 12.3–15.4)
WBC: 9.2 10*3/uL (ref 3.4–10.8)

## 2014-02-15 LAB — PROTIME-INR
INR: 1.1 (ref 0.8–1.2)
Prothrombin Time: 10.8 s (ref 9.1–12.0)

## 2014-02-15 LAB — BASIC METABOLIC PANEL
BUN/Creatinine Ratio: 19 (ref 10–22)
BUN: 19 mg/dL (ref 8–27)
CHLORIDE: 103 mmol/L (ref 97–108)
CO2: 18 mmol/L (ref 18–29)
Calcium: 9 mg/dL (ref 8.6–10.2)
Creatinine, Ser: 0.99 mg/dL (ref 0.76–1.27)
GFR calc Af Amer: 95 mL/min/{1.73_m2} (ref >59)
GFR calc non Af Amer: 82 mL/min/{1.73_m2} (ref >59)
Glucose: 128 mg/dL — ABNORMAL HIGH (ref 65–99)
Potassium: 4.1 mmol/L (ref 3.5–5.2)
Sodium: 139 mmol/L (ref 134–144)

## 2014-02-15 NOTE — Telephone Encounter (Signed)
Spoke w/ pt.  Advised him that we would need to resched his cath for a day when Dr. Rockey Situ and Dr. Fletcher Anon are both in the hospital.  He is agreeable and has been moved to 03/01/14 @ 7:30.

## 2014-02-18 ENCOUNTER — Other Ambulatory Visit: Payer: Self-pay | Admitting: Family Medicine

## 2014-02-18 NOTE — Telephone Encounter (Signed)
Electronic refill request. Last Filled:   11/15/13 for 60 tablets 2 RF.  Please advise.

## 2014-02-18 NOTE — Telephone Encounter (Signed)
Please call in

## 2014-02-19 NOTE — Telephone Encounter (Signed)
Rx called in to pharmacy. 

## 2014-03-01 ENCOUNTER — Ambulatory Visit: Payer: Self-pay | Admitting: Cardiovascular Disease

## 2014-03-01 DIAGNOSIS — I2510 Atherosclerotic heart disease of native coronary artery without angina pectoris: Secondary | ICD-10-CM

## 2014-03-03 ENCOUNTER — Telehealth: Payer: Self-pay | Admitting: Physician Assistant

## 2014-03-03 ENCOUNTER — Emergency Department: Payer: Self-pay | Admitting: Emergency Medicine
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
## 2014-03-04 ENCOUNTER — Other Ambulatory Visit: Payer: Self-pay | Admitting: Family Medicine

## 2014-03-04 NOTE — Telephone Encounter (Signed)
Ultrasound was done in the ER Can we obtain a copy of this and call him to followup It is not uncommon to have bruising Would make sure he has followup in clinic

## 2014-03-04 NOTE — Telephone Encounter (Signed)
Spoke w/ pt.  He reports that his leg is black down to his knee. Offered pt sooner appt to have this looked at and that I will pull his records from [facility] over the weekend.  He is agreeable.

## 2014-03-05 ENCOUNTER — Encounter: Payer: Self-pay | Admitting: Cardiovascular Disease

## 2014-03-05 ENCOUNTER — Ambulatory Visit (INDEPENDENT_AMBULATORY_CARE_PROVIDER_SITE_OTHER): Payer: Medicare Other | Admitting: Cardiovascular Disease

## 2014-03-05 VITALS — BP 130/72 | HR 80 | Ht 71.0 in | Wt 240.0 lb

## 2014-03-05 DIAGNOSIS — I2510 Atherosclerotic heart disease of native coronary artery without angina pectoris: Secondary | ICD-10-CM

## 2014-03-05 DIAGNOSIS — I10 Essential (primary) hypertension: Secondary | ICD-10-CM

## 2014-03-05 DIAGNOSIS — E119 Type 2 diabetes mellitus without complications: Secondary | ICD-10-CM

## 2014-03-05 DIAGNOSIS — R000 Tachycardia, unspecified: Secondary | ICD-10-CM

## 2014-03-05 DIAGNOSIS — Z720 Tobacco use: Secondary | ICD-10-CM

## 2014-03-05 DIAGNOSIS — F17200 Nicotine dependence, unspecified, uncomplicated: Secondary | ICD-10-CM

## 2014-03-05 DIAGNOSIS — I498 Other specified cardiac arrhythmias: Secondary | ICD-10-CM

## 2014-03-05 NOTE — Assessment & Plan Note (Signed)
We have encouraged continued exercise, careful diet management in an effort to lose weight. 

## 2014-03-05 NOTE — Assessment & Plan Note (Signed)
We have encouraged him to continue to work on weaning his cigarettes and smoking cessation. He will continue to work on this and does not want any assistance with chantix.  

## 2014-03-05 NOTE — Patient Instructions (Addendum)
You are doing well. No medication changes were made.  Please call us if you have new issues that need to be addressed before your next appt.  Your physician wants you to follow-up in: 12 months.  You will receive a reminder letter in the mail two months in advance. If you don't receive a letter, please call our office to schedule the follow-up appointment. 

## 2014-03-05 NOTE — Progress Notes (Addendum)
Patient ID: Mark Morris, male    DOB: Nov 30, 1950, 63 y.o.   MRN: [id number]  HPI Comments: Mark Morris is a 63 year-old gentleman with history of mild CAD in 2004, Myoview in 2005 showing no significant ischemia, chronic fatigue and poor energy for the past several years, history of SVT requiring adenosine, who presents for routine followup after cardiac catheterization. He has chronic back pain lower as well as upper. He is a smoker  Recent stress test showing ischemia in the inferior wall. Ejection fraction 40%. He had cardiac catheterization on 03/01/2014. This showed mild proximal LAD disease, otherwise mild luminal irregularities. Mild to moderate global hypokinesis. Ejection fraction 35-45%. Significant ectopy noted, APCs. Etiology of his depressed ejection fraction is unclear. He denies alcohol. Unable to exclude sleep apnea  In followup today after the catheterization, he had significant bruising in his right groin. He presented to the emergency room for evaluation. Ultrasound done 03/03/2014 showing normal exam, no pseudoaneurysm.  He continues to smoke one half pack per day, diabetes previously well controlled with hemoglobin A1c 6.0  cholesterol is also well controlled, 150 range  He reports that he might need back surgery. Prior stress test showing no ischemia August 2014 History of carpal tunnel surgery on the left   lab work shows normal total testosterone, low free testosterone Also reports having anemia in the past. No recent CBC  EKG shows normal sinus rhythm with rate 80 beats per minute, nonspecific ST abnormality in the anterolateral leads, APCs noted  Past Medical History . Hyperlipidemia  . Allergy  . Hypertension  . Anxiety  . Depression  . Diabetes mellitus  . Enteritis 10/18/2004  CT abd. Enteritis//CT pelvis Sacroiliac Spurring . Sigmoid polyp 07/03/2007  colonoscopy Small internal hemorrhoids; Sigmoid polyp B9 (Dr. Elliott)//colonoscopy normal  04/06/2004 . CAD,  Nonobstructive by cardiac catheterization 5/04: Proximal RCA 25%, EF 65%;  Myoview 12/05: no scar or ischemia, EF 58% . Sleep apnea  . BPH (benign prostatic hyperplasia)  . Paroxysmal SVT (supraventricular tachycardia) . Tobacco abuse    Past Surgical History . Cardiac catheterization 01/14/2003   Min plaque EF65%// Cath normal (Dr. Clayborn Bigness 2000)//ECHO Mild AAA TrMR Ef61% 05/30/2002 (Dr.Callwood) . Gastric fundoplication 0277     [facility] . Hernia repair 01/02/2006   right ventral Hernia Repair Laparascopic [place] Pai . 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 Sleep study 03/2004    Sleep Study Hypopneas 60/hr ; Kicks 160/hr . Ankle fracture surgery 02/2003 . Carpal tunnel release  . Back surgery  . Cholecystectomy  . Appendectomy     Outpatient Encounter Prescriptions as of 03/05/2014  Medication Sig  . ALPRAZolam (XANAX) 0.5 MG tablet TAKE ONE TABLET BY MOUTH TWICE DAILY AS NEEDED  . budesonide-formoterol (SYMBICORT) 160-4.5 MCG/ACT inhaler Inhale 1 puff into the lungs 2 (two) times daily. Rinse after use  . calcium elemental as carbonate (CALCIUM ANTACID ULTRA) 400 MG tablet Chew 1,000 mg by mouth daily.    . cyclobenzaprine (FLEXERIL) 10 MG tablet TAKE ONE TABLET BY MOUTH THREE TIMES DAILY AS NEEDED FOR MUSCLE SPASM  . doxazosin (CARDURA) 2 MG tablet TAKE ONE TABLET BY MOUTH ONCE DAILY  . doxazosin (CARDURA) 4 MG tablet Take 1 tablet (4 mg total) by mouth at bedtime.  . fexofenadine (ALLEGRA) 180 MG tablet Take 180 mg by mouth daily.    . finasteride (PROSCAR) 5 MG tablet TAKE ONE TABLET BY MOUTH ONCE DAILY  . FLUoxetine (PROZAC) 40 MG capsule TAKE ONE CAPSULE BY MOUTH ONCE DAILY  .  glucose blood (ONE TOUCH ULTRA TEST) test strip Use as instructed to test blood sugar once daily.  Diagnosis:  250.00  Non insulin-dependent.  Marland Kitchen lisinopril (PRINIVIL,ZESTRIL) 20 MG tablet Take 1 tablet (20 mg total) by mouth daily.  . meloxicam (MOBIC) 15 MG tablet TAKE ONE TABLET BY MOUTH  ONCE DAILY WITH FOOD  . metFORMIN (GLUCOPHAGE) 500 MG tablet TAKE ONE TABLET BY MOUTH TWICE DAILY  . metoprolol tartrate (LOPRESSOR) 25 MG tablet Take 1 tablet (25 mg total) by mouth 2 (two) times daily.  . Multiple Vitamin (MULTIVITAMIN) tablet Take 1 tablet by mouth daily.    Glory Rosebush DELICA LANCETS FINE MISC Use as instructed to check blood sugar once daily.  Marland Kitchen oxyCODONE-acetaminophen (PERCOCET/ROXICET) 5-325 MG per tablet   . potassium chloride (K-DUR) 10 MEQ tablet TAKE ONE TABLET BY MOUTH ONCE DAILY  . PROAIR HFA 108 (90 BASE) MCG/ACT inhaler INHALE TWO PUFFS BY MOUTH EVERY 6 HOURS AS NEEDED FOR WHEEZING  . rOPINIRole (REQUIP) 1 MG tablet TAKE ONE TABLET BY MOUTH IN THE EVENING AT BEDTIME  . simvastatin (ZOCOR) 20 MG tablet TAKE ONE TABLET BY MOUTH ONCE DAILY  . tiotropium (SPIRIVA HANDIHALER) 18 MCG inhalation capsule Place 1 capsule (18 mcg total) into inhaler and inhale daily.  . traZODone (DESYREL) 50 MG tablet TAKE ONE-HALF TO ONE TABLET BY MOUTH AT BEDTIME AS NEEDED FOR SLEEP    Review of Systems  Constitutional: Negative.   HENT: Negative.   Eyes: Negative.   Respiratory: Negative.   Cardiovascular: Negative.   Gastrointestinal: Negative.   Endocrine: Negative.   Musculoskeletal: Positive for back pain.  Skin: Negative.   Allergic/Immunologic: Negative.   Neurological: Negative.   Hematological: Negative.   Psychiatric/Behavioral: Negative.   All other systems reviewed and are negative.   BP 130/72  Pulse 80  Ht 5\' 11"  (1.803 m)  Wt 240 lb (108.863 kg)  BMI 33.49 kg/m2  Physical Exam  Nursing note and vitals reviewed. Constitutional: He is oriented to person, place, and time. He appears well-developed and well-nourished.  Obese  HENT:  Head: Normocephalic.  Nose: Nose normal.  Mouth/Throat: Oropharynx is clear and moist.  Eyes: Conjunctivae are normal. Pupils are equal, round, and reactive to light.  Neck: Normal range of motion. Neck supple. No JVD  present.  Cardiovascular: Normal rate, regular rhythm, S1 normal, S2 normal, normal heart sounds and intact distal pulses.  Exam reveals no gallop and no friction rub.   No murmur heard. Pulmonary/Chest: Effort normal and breath sounds normal. No respiratory distress. He has no wheezes. He has no rales. He exhibits no tenderness.  Abdominal: Soft. Bowel sounds are normal. He exhibits no distension. There is no tenderness.  Musculoskeletal: Normal range of motion. He exhibits no edema and no tenderness.  Lymphadenopathy:    He has no cervical adenopathy.  Neurological: He is alert and oriented to person, place, and time. Coordination normal.  Skin: Skin is warm and dry. No rash noted. No erythema.  Right groin ecchymosis with bruising down his thigh  Psychiatric: He has a normal mood and affect. His behavior is normal. Judgment and thought content normal.      Assessment and Plan

## 2014-03-05 NOTE — Assessment & Plan Note (Signed)
Recent cardiac catheterization last week showing minimal proximal LAD disease. No further testing needed. We'll continue aggressive cholesterol management

## 2014-03-05 NOTE — Assessment & Plan Note (Signed)
Blood pressure is well controlled on today's visit. No changes made to the medications. 

## 2014-03-06 ENCOUNTER — Encounter: Payer: Self-pay | Admitting: Cardiovascular Disease

## 2014-03-12 ENCOUNTER — Ambulatory Visit: Payer: Medicare Other | Admitting: Cardiovascular Disease

## 2014-03-19 ENCOUNTER — Other Ambulatory Visit: Payer: Self-pay | Admitting: Family Medicine

## 2014-03-20 ENCOUNTER — Other Ambulatory Visit: Payer: Self-pay | Admitting: Neurosurgery

## 2014-03-20 NOTE — Telephone Encounter (Signed)
Last OV with you was 01/01/14--Rx last filled 09/22/13 with 5 refills--please advise

## 2014-03-20 NOTE — Telephone Encounter (Signed)
Sent. Thanks.   

## 2014-03-21 ENCOUNTER — Other Ambulatory Visit: Payer: Self-pay | Admitting: Cardiovascular Disease

## 2014-03-29 ENCOUNTER — Encounter (HOSPITAL_COMMUNITY): Payer: Self-pay

## 2014-03-29 ENCOUNTER — Encounter (HOSPITAL_COMMUNITY)
Admission: RE | Admit: 2014-03-29 | Discharge: 2014-03-29 | Disposition: A | Discharge status: Home / Self Care | Payer: Medicare Other | Source: Ambulatory Visit | Attending: Neurosurgery | Admitting: Neurosurgery

## 2014-03-29 DIAGNOSIS — Z01818 Encounter for other preprocedural examination: Secondary | ICD-10-CM | POA: Diagnosis not present

## 2014-03-29 DIAGNOSIS — Z01812 Encounter for preprocedural laboratory examination: Secondary | ICD-10-CM | POA: Insufficient documentation

## 2014-03-29 HISTORY — DX: Unspecified osteoarthritis, unspecified site: M19.90

## 2014-03-29 HISTORY — DX: Unspecified hearing loss, bilateral: H91.93

## 2014-03-29 HISTORY — DX: Infection following a procedure, unspecified, initial encounter: T81.40XA

## 2014-03-29 HISTORY — DX: Other intervertebral disc degeneration, lumbosacral region without mention of lumbar back pain or lower extremity pain: M51.379

## 2014-03-29 HISTORY — DX: Cardiac arrhythmia, unspecified: I49.9

## 2014-03-29 HISTORY — DX: Other intervertebral disc displacement, lumbar region: M51.26

## 2014-03-29 HISTORY — DX: Other intervertebral disc degeneration, lumbosacral region: M51.37

## 2014-03-29 HISTORY — DX: Shortness of breath: R06.02

## 2014-03-29 LAB — BASIC METABOLIC PANEL
ANION GAP: 12 (ref 5–15)
BUN: 13 mg/dL (ref 6–23)
CO2: 26 mEq/L (ref 19–32)
Calcium: 8.7 mg/dL (ref 8.4–10.5)
Chloride: 106 mEq/L (ref 96–112)
Creatinine, Ser: 1.09 mg/dL (ref 0.50–1.35)
GFR, EST AFRICAN AMERICAN: 83 mL/min — AB (ref >90)
GFR, EST NON AFRICAN AMERICAN: 72 mL/min — AB (ref >90)
GLUCOSE: 87 mg/dL (ref 70–99)
POTASSIUM: 4.6 meq/L (ref 3.7–5.3)
SODIUM: 144 meq/L (ref 137–147)

## 2014-03-29 LAB — CBC
HCT: 37.8 % — ABNORMAL LOW (ref 39.0–52.0)
Hemoglobin: 11.2 g/dL — ABNORMAL LOW (ref 13.0–17.0)
MCH: 23.3 pg — ABNORMAL LOW (ref 26.0–34.0)
MCHC: 29.6 g/dL — ABNORMAL LOW (ref 30.0–36.0)
MCV: 78.6 fL (ref 78.0–100.0)
PLATELETS: 282 10*3/uL (ref 150–400)
RBC: 4.81 MIL/uL (ref 4.22–5.81)
RDW: 16.2 % — ABNORMAL HIGH (ref 11.5–15.5)
WBC: 9.3 10*3/uL (ref 4.0–10.5)

## 2014-03-29 LAB — ABO/RH: ABO/RH(D): A POS

## 2014-03-29 LAB — TYPE AND SCREEN
ABO/RH(D): A POS
ANTIBODY SCREEN: NEGATIVE

## 2014-03-29 LAB — SURGICAL PCR SCREEN
MRSA, PCR: NEGATIVE
Staphylococcus aureus: NEGATIVE

## 2014-03-29 NOTE — Pre-Procedure Instructions (Signed)
Mark Morris  03/29/2014   Your procedure is scheduled on:  Friday, August 21  Report to [facility] Admitting at 1000 AM.  Call this number if you have problems the morning of surgery: [phone]   Remember:   Do not eat food or drink liquids after midnight.Thursday night   Take these medicines the morning of surgery with A SIP OF WATER: alprazalam (Xanax), Symbicort, doxazosin (Cardura), fluoxetine (Prozac), hydrocodone as needed for pain, cyclobenzaprine as needed for muscle spasms, Metoprolol   Do not wear jewelry.  Do not wear lotions, powders, or perfumes. You may wear deodorant.  Do not shave 48 hours prior to surgery. Men may shave face and neck.  Do not bring valuables to the hospital.  [facility] is not responsible   for any belongings or valuables.               Contacts, dentures or bridgework may not be worn into surgery.  Leave suitcase in the car. After surgery it may be brought to your room.  For patients admitted to the hospital, discharge time is determined by your   treatment team.               Patients discharged the day of surgery will not be allowed to drive  home.  Name and phone number of your driver:       Special Instructions: [facility] - Preparing for Surgery  Before surgery, you can play an important role.  Because skin is not sterile, your skin needs to be as free of germs as possible.  You can reduce the number of germs on you skin by washing with CHG (chlorahexidine gluconate) soap before surgery.  CHG is an antiseptic cleaner which kills germs and bonds with the skin to continue killing germs even after washing.  Please DO NOT use if you have an allergy to CHG or antibacterial soaps.  If your skin becomes reddened/irritated stop using the CHG and inform your nurse when you arrive at Short Stay.  Do not shave (including legs and underarms) for at least 48 hours prior to the first CHG shower.  You may shave your face.  Please follow  these instructions carefully:   1.  Shower with CHG Soap the night before surgery and the  morning of Surgery.  2.  If you choose to wash your hair, wash your hair first as usual with your  normal shampoo.  3.  After you shampoo, rinse your hair and body thoroughly to remove the Shampoo.  4.  Use CHG as you would any other liquid soap.  You can apply chg directly  to the skin and wash gently with scrungie or a clean washcloth.  5.  Apply the CHG Soap to your body ONLY FROM THE NECK DOWN.  Do not use on open wounds or open sores.  Avoid contact with your eyes,   ears, mouth and genitals (private parts).  Wash genitals (private parts) with your normal soap.  6.  Wash thoroughly, paying special attention to the area where your surgery   will be performed.  7.  Thoroughly rinse your body with warm water from the neck down.  8.  DO NOT shower/wash with your normal soap after using and rinsing off   the CHG Soap.  9.  Pat yourself dry with a clean towel.            10.  Wear clean pajamas.  11.  Place clean sheets on your bed the night of your first shower and do not  sleep with pets.  Day of Surgery  Do not apply any lotions/deoderants the morning of surgery.  Please wear clean clothes to the hospital/surgery center.     Please read over the following fact sheets that you were given: Pain Booklet, Coughing and Deep Breathing, Blood Transfusion Information and Surgical Site Infection Prevention

## 2014-03-31 ENCOUNTER — Other Ambulatory Visit: Payer: Self-pay | Admitting: Family Medicine

## 2014-04-01 NOTE — Telephone Encounter (Signed)
Electronic refill request, please advise  

## 2014-04-02 NOTE — Telephone Encounter (Signed)
Sent. Thanks.   

## 2014-04-03 ENCOUNTER — Encounter: Payer: Self-pay | Admitting: Cardiovascular Disease

## 2014-04-04 ENCOUNTER — Encounter (HOSPITAL_COMMUNITY): Payer: Self-pay | Admitting: Pharmacy Technician

## 2014-04-11 MED ORDER — CEFAZOLIN SODIUM-DEXTROSE 2-3 GM-% IV SOLR
2.0000 g | INTRAVENOUS | Status: AC
  Administered 2014-04-12: 2 g via INTRAVENOUS
  Filled 2014-04-11: qty 50

## 2014-04-12 ENCOUNTER — Inpatient Hospital Stay (HOSPITAL_COMMUNITY): Payer: Medicare Other

## 2014-04-12 ENCOUNTER — Encounter (HOSPITAL_COMMUNITY)
Admission: RE | Disposition: A | Discharge status: Home / Self Care | Payer: Medicare Other | Source: Ambulatory Visit | Attending: Neurosurgery

## 2014-04-12 ENCOUNTER — Inpatient Hospital Stay (HOSPITAL_COMMUNITY): Payer: Medicare Other | Admitting: Anesthesiology

## 2014-04-12 ENCOUNTER — Inpatient Hospital Stay (HOSPITAL_COMMUNITY)
Admission: RE | Admit: 2014-04-12 | Discharge: 2014-04-14 | DRG: 460 | Disposition: A | Discharge status: Home / Self Care | Payer: Medicare Other | Source: Ambulatory Visit | Attending: Neurosurgery | Admitting: Neurosurgery

## 2014-04-12 ENCOUNTER — Encounter (HOSPITAL_COMMUNITY): Payer: Medicare Other | Admitting: Anesthesiology

## 2014-04-12 ENCOUNTER — Encounter (HOSPITAL_COMMUNITY): Payer: Self-pay | Admitting: *Deleted

## 2014-04-12 DIAGNOSIS — J449 Chronic obstructive pulmonary disease, unspecified: Secondary | ICD-10-CM | POA: Diagnosis present

## 2014-04-12 DIAGNOSIS — E119 Type 2 diabetes mellitus without complications: Secondary | ICD-10-CM | POA: Diagnosis present

## 2014-04-12 DIAGNOSIS — M4716 Other spondylosis with myelopathy, lumbar region: Secondary | ICD-10-CM | POA: Diagnosis present

## 2014-04-12 DIAGNOSIS — I10 Essential (primary) hypertension: Secondary | ICD-10-CM | POA: Diagnosis present

## 2014-04-12 DIAGNOSIS — Z79899 Other long term (current) drug therapy: Secondary | ICD-10-CM

## 2014-04-12 DIAGNOSIS — F411 Generalized anxiety disorder: Secondary | ICD-10-CM | POA: Diagnosis present

## 2014-04-12 DIAGNOSIS — I472 Ventricular tachycardia, unspecified: Secondary | ICD-10-CM | POA: Diagnosis present

## 2014-04-12 DIAGNOSIS — J4489 Other specified chronic obstructive pulmonary disease: Secondary | ICD-10-CM | POA: Diagnosis present

## 2014-04-12 DIAGNOSIS — F329 Major depressive disorder, single episode, unspecified: Secondary | ICD-10-CM | POA: Diagnosis present

## 2014-04-12 DIAGNOSIS — Q762 Congenital spondylolisthesis: Secondary | ICD-10-CM

## 2014-04-12 DIAGNOSIS — M5126 Other intervertebral disc displacement, lumbar region: Principal | ICD-10-CM | POA: Diagnosis present

## 2014-04-12 DIAGNOSIS — G4730 Sleep apnea, unspecified: Secondary | ICD-10-CM | POA: Diagnosis present

## 2014-04-12 DIAGNOSIS — F3289 Other specified depressive episodes: Secondary | ICD-10-CM | POA: Diagnosis present

## 2014-04-12 DIAGNOSIS — I4729 Other ventricular tachycardia: Secondary | ICD-10-CM | POA: Diagnosis present

## 2014-04-12 DIAGNOSIS — Z885 Allergy status to narcotic agent status: Secondary | ICD-10-CM

## 2014-04-12 DIAGNOSIS — Z881 Allergy status to other antibiotic agents status: Secondary | ICD-10-CM | POA: Diagnosis not present

## 2014-04-12 DIAGNOSIS — F17200 Nicotine dependence, unspecified, uncomplicated: Secondary | ICD-10-CM | POA: Diagnosis present

## 2014-04-12 HISTORY — PX: MAXIMUM ACCESS (MAS)POSTERIOR LUMBAR INTERBODY FUSION (PLIF) 2 LEVEL: SHX6369

## 2014-04-12 LAB — GLUCOSE, CAPILLARY
GLUCOSE-CAPILLARY: 229 mg/dL — AB (ref 70–99)
Glucose-Capillary: 111 mg/dL — ABNORMAL HIGH (ref 70–99)
Glucose-Capillary: 102 mg/dL — ABNORMAL HIGH (ref 70–99)
Glucose-Capillary: 108 mg/dL — ABNORMAL HIGH (ref 70–99)

## 2014-04-12 SURGERY — FOR MAXIMUM ACCESS (MAS) POSTERIOR LUMBAR INTERBODY FUSION (PLIF) 2 LEVEL
Anesthesia: General | Site: Back

## 2014-04-12 MED ORDER — ONDANSETRON HCL 4 MG/2ML IJ SOLN: 4.0000 mg | Freq: Four times a day (QID) | INTRAMUSCULAR | Status: DC | PRN

## 2014-04-12 MED ORDER — MIDAZOLAM HCL 2 MG/2ML IJ SOLN
INTRAMUSCULAR | Status: AC
  Filled 2014-04-12: qty 2

## 2014-04-12 MED ORDER — OXYCODONE HCL 5 MG/5ML PO SOLN: 5.0000 mg | Freq: Once | ORAL | Status: AC | PRN

## 2014-04-12 MED ORDER — DOCUSATE SODIUM 100 MG PO CAPS
100.0000 mg | ORAL_CAPSULE | Freq: Two times a day (BID) | ORAL | Status: DC
  Administered 2014-04-12 – 2014-04-14 (×3): 100 mg via ORAL
  Filled 2014-04-12 (×3): qty 1

## 2014-04-12 MED ORDER — PROPOFOL 10 MG/ML IV BOLUS
INTRAVENOUS | Status: AC
  Filled 2014-04-12: qty 20

## 2014-04-12 MED ORDER — INSULIN ASPART 100 UNIT/ML ~~LOC~~ SOLN: 0.0000 [IU] | Freq: Every day | SUBCUTANEOUS | Status: DC

## 2014-04-12 MED ORDER — PHENYLEPHRINE HCL 10 MG/ML IJ SOLN
10.0000 mg | INTRAMUSCULAR | Status: DC | PRN
  Administered 2014-04-12 (×2): 40 ug/min via INTRAVENOUS

## 2014-04-12 MED ORDER — HYDROMORPHONE HCL PF 1 MG/ML IJ SOLN
0.2500 mg | INTRAMUSCULAR | Status: DC | PRN
  Administered 2014-04-12 (×4): 0.5 mg via INTRAVENOUS

## 2014-04-12 MED ORDER — EPHEDRINE SULFATE 50 MG/ML IJ SOLN
INTRAMUSCULAR | Status: AC
  Filled 2014-04-12: qty 1

## 2014-04-12 MED ORDER — SODIUM CHLORIDE 0.9 % IV SOLN: 250.0000 mL | INTRAVENOUS | Status: DC

## 2014-04-12 MED ORDER — HYDROMORPHONE HCL PF 1 MG/ML IJ SOLN
INTRAMUSCULAR | Status: AC
  Administered 2014-04-12: 0.5 mg via INTRAVENOUS
  Filled 2014-04-12: qty 1

## 2014-04-12 MED ORDER — CEFAZOLIN SODIUM 1-5 GM-% IV SOLN
1.0000 g | Freq: Three times a day (TID) | INTRAVENOUS | Status: AC
  Administered 2014-04-12 – 2014-04-13 (×2): 1 g via INTRAVENOUS
  Filled 2014-04-12 (×2): qty 50

## 2014-04-12 MED ORDER — FLEET ENEMA 7-19 GM/118ML RE ENEM: 1.0000 | ENEMA | Freq: Once | RECTAL | Status: AC | PRN

## 2014-04-12 MED ORDER — 0.9 % SODIUM CHLORIDE (POUR BTL) OPTIME
TOPICAL | Status: DC | PRN
  Administered 2014-04-12: 1000 mL

## 2014-04-12 MED ORDER — LACTATED RINGERS IV SOLN
INTRAVENOUS | Status: DC | PRN
  Administered 2014-04-12 (×3): via INTRAVENOUS

## 2014-04-12 MED ORDER — LORATADINE 10 MG PO TABS
10.0000 mg | ORAL_TABLET | Freq: Every day | ORAL | Status: DC
  Administered 2014-04-13 – 2014-04-14 (×2): 10 mg via ORAL
  Filled 2014-04-12 (×2): qty 1

## 2014-04-12 MED ORDER — MIDAZOLAM HCL 5 MG/5ML IJ SOLN
INTRAMUSCULAR | Status: DC | PRN
  Administered 2014-04-12: 2 mg via INTRAVENOUS

## 2014-04-12 MED ORDER — ALBUTEROL SULFATE (2.5 MG/3ML) 0.083% IN NEBU: 2.5000 mg | INHALATION_SOLUTION | Freq: Four times a day (QID) | RESPIRATORY_TRACT | Status: DC | PRN

## 2014-04-12 MED ORDER — BUPIVACAINE LIPOSOME 1.3 % IJ SUSP
20.0000 mL | INTRAMUSCULAR | Status: AC
  Administered 2014-04-12: 20 mL
  Filled 2014-04-12: qty 20

## 2014-04-12 MED ORDER — FLUOXETINE HCL 40 MG PO CAPS: 40.0000 mg | ORAL_CAPSULE | Freq: Every day | ORAL | Status: DC

## 2014-04-12 MED ORDER — DIAZEPAM 5 MG PO TABS
5.0000 mg | ORAL_TABLET | Freq: Four times a day (QID) | ORAL | Status: DC | PRN
  Administered 2014-04-12 – 2014-04-13 (×3): 5 mg via ORAL
  Filled 2014-04-12 (×2): qty 1

## 2014-04-12 MED ORDER — PHENYLEPHRINE 40 MCG/ML (10ML) SYRINGE FOR IV PUSH (FOR BLOOD PRESSURE SUPPORT)
PREFILLED_SYRINGE | INTRAVENOUS | Status: AC
  Filled 2014-04-12: qty 10

## 2014-04-12 MED ORDER — MORPHINE SULFATE 2 MG/ML IJ SOLN: 1.0000 mg | INTRAMUSCULAR | Status: DC | PRN

## 2014-04-12 MED ORDER — PROPOFOL INFUSION 10 MG/ML OPTIME
INTRAVENOUS | Status: DC | PRN
Start: 2014-04-12 — End: 2014-04-12
  Administered 2014-04-12: 50 ug/kg/min via INTRAVENOUS

## 2014-04-12 MED ORDER — LISINOPRIL 20 MG PO TABS
20.0000 mg | ORAL_TABLET | Freq: Every day | ORAL | Status: DC
  Administered 2014-04-13 – 2014-04-14 (×2): 20 mg via ORAL
  Filled 2014-04-12 (×2): qty 1

## 2014-04-12 MED ORDER — ALPRAZOLAM 0.5 MG PO TABS: 0.5000 mg | ORAL_TABLET | Freq: Two times a day (BID) | ORAL | Status: DC | PRN

## 2014-04-12 MED ORDER — ARTIFICIAL TEARS OP OINT
TOPICAL_OINTMENT | OPHTHALMIC | Status: DC | PRN
  Administered 2014-04-12: 1 via OPHTHALMIC

## 2014-04-12 MED ORDER — GLYCOPYRROLATE 0.2 MG/ML IJ SOLN
INTRAMUSCULAR | Status: DC | PRN
  Administered 2014-04-12 (×2): 0.2 mg via INTRAVENOUS

## 2014-04-12 MED ORDER — ONDANSETRON HCL 4 MG/2ML IJ SOLN
INTRAMUSCULAR | Status: DC | PRN
  Administered 2014-04-12: 4 mg via INTRAVENOUS

## 2014-04-12 MED ORDER — SIMVASTATIN 20 MG PO TABS
20.0000 mg | ORAL_TABLET | Freq: Every day | ORAL | Status: DC
  Administered 2014-04-12 – 2014-04-13 (×2): 20 mg via ORAL
  Filled 2014-04-12 (×2): qty 1

## 2014-04-12 MED ORDER — ONE-DAILY MULTI VITAMINS PO TABS: 1.0000 | ORAL_TABLET | Freq: Every day | ORAL | Status: DC

## 2014-04-12 MED ORDER — ALUM & MAG HYDROXIDE-SIMETH 200-200-20 MG/5ML PO SUSP: 30.0000 mL | Freq: Four times a day (QID) | ORAL | Status: DC | PRN

## 2014-04-12 MED ORDER — CALCIUM CARBONATE ANTACID 500 MG PO CHEW
1.0000 | CHEWABLE_TABLET | Freq: Every day | ORAL | Status: DC
  Administered 2014-04-13 – 2014-04-14 (×2): 200 mg via ORAL
  Filled 2014-04-12 (×2): qty 1

## 2014-04-12 MED ORDER — DEXAMETHASONE SODIUM PHOSPHATE 10 MG/ML IJ SOLN
INTRAMUSCULAR | Status: AC
  Filled 2014-04-12: qty 1

## 2014-04-12 MED ORDER — FENTANYL CITRATE 0.05 MG/ML IJ SOLN
INTRAMUSCULAR | Status: AC
  Filled 2014-04-12: qty 5

## 2014-04-12 MED ORDER — PHENYLEPHRINE HCL 10 MG/ML IJ SOLN
INTRAMUSCULAR | Status: AC
  Filled 2014-04-12: qty 1

## 2014-04-12 MED ORDER — HYDROCODONE-ACETAMINOPHEN 5-325 MG PO TABS: 1.0000 | ORAL_TABLET | Freq: Four times a day (QID) | ORAL | Status: DC | PRN

## 2014-04-12 MED ORDER — DOXAZOSIN MESYLATE 2 MG PO TABS
2.0000 mg | ORAL_TABLET | Freq: Every day | ORAL | Status: DC
  Administered 2014-04-12 – 2014-04-14 (×3): 2 mg via ORAL
  Filled 2014-04-12 (×3): qty 1

## 2014-04-12 MED ORDER — OXYCODONE HCL 5 MG PO TABS
ORAL_TABLET | ORAL | Status: AC
  Administered 2014-04-12: 5 mg via ORAL
  Filled 2014-04-12: qty 1

## 2014-04-12 MED ORDER — SODIUM CHLORIDE 0.9 % IJ SOLN: 3.0000 mL | INTRAMUSCULAR | Status: DC | PRN

## 2014-04-12 MED ORDER — LIDOCAINE HCL (CARDIAC) 20 MG/ML IV SOLN
INTRAVENOUS | Status: AC
  Filled 2014-04-12: qty 5

## 2014-04-12 MED ORDER — FINASTERIDE 5 MG PO TABS
5.0000 mg | ORAL_TABLET | Freq: Every day | ORAL | Status: DC
  Administered 2014-04-12 – 2014-04-14 (×3): 5 mg via ORAL
  Filled 2014-04-12 (×3): qty 1

## 2014-04-12 MED ORDER — BUDESONIDE-FORMOTEROL FUMARATE 160-4.5 MCG/ACT IN AERO
1.0000 | INHALATION_SPRAY | Freq: Two times a day (BID) | RESPIRATORY_TRACT | Status: DC
  Administered 2014-04-12 – 2014-04-14 (×4): 1 via RESPIRATORY_TRACT
  Filled 2014-04-12: qty 6

## 2014-04-12 MED ORDER — LIDOCAINE-EPINEPHRINE 1 %-1:100000 IJ SOLN
INTRAMUSCULAR | Status: DC | PRN
  Administered 2014-04-12: 5 mL via INTRADERMAL

## 2014-04-12 MED ORDER — LACTATED RINGERS IV SOLN
INTRAVENOUS | Status: DC
  Administered 2014-04-12: 10:00:00 via INTRAVENOUS

## 2014-04-12 MED ORDER — GLYCOPYRROLATE 0.2 MG/ML IJ SOLN
INTRAMUSCULAR | Status: AC
  Filled 2014-04-12: qty 2

## 2014-04-12 MED ORDER — TRAZODONE HCL 50 MG PO TABS: 25.0000 mg | ORAL_TABLET | Freq: Every evening | ORAL | Status: DC | PRN

## 2014-04-12 MED ORDER — KCL IN DEXTROSE-NACL 20-5-0.45 MEQ/L-%-% IV SOLN
INTRAVENOUS | Status: DC
  Administered 2014-04-12: 21:00:00 via INTRAVENOUS
  Filled 2014-04-12: qty 1000

## 2014-04-12 MED ORDER — ONDANSETRON HCL 4 MG/2ML IJ SOLN: 4.0000 mg | INTRAMUSCULAR | Status: DC | PRN

## 2014-04-12 MED ORDER — FLUOXETINE HCL 20 MG PO CAPS
40.0000 mg | ORAL_CAPSULE | Freq: Every day | ORAL | Status: DC
  Administered 2014-04-12 – 2014-04-14 (×3): 40 mg via ORAL
  Filled 2014-04-12 (×3): qty 2

## 2014-04-12 MED ORDER — MELOXICAM 7.5 MG PO TABS
15.0000 mg | ORAL_TABLET | Freq: Every day | ORAL | Status: DC
  Administered 2014-04-13 – 2014-04-14 (×2): 15 mg via ORAL
  Filled 2014-04-12 (×2): qty 2

## 2014-04-12 MED ORDER — TIOTROPIUM BROMIDE MONOHYDRATE 18 MCG IN CAPS
18.0000 ug | ORAL_CAPSULE | Freq: Every day | RESPIRATORY_TRACT | Status: DC
  Administered 2014-04-13 – 2014-04-14 (×2): 18 ug via RESPIRATORY_TRACT
  Filled 2014-04-12: qty 5

## 2014-04-12 MED ORDER — ACETAMINOPHEN 325 MG PO TABS: 650.0000 mg | ORAL_TABLET | ORAL | Status: DC | PRN

## 2014-04-12 MED ORDER — SODIUM CHLORIDE 0.9 % IJ SOLN
3.0000 mL | Freq: Two times a day (BID) | INTRAMUSCULAR | Status: DC
  Administered 2014-04-12 – 2014-04-14 (×4): 3 mL via INTRAVENOUS

## 2014-04-12 MED ORDER — ONDANSETRON HCL 4 MG/2ML IJ SOLN
INTRAMUSCULAR | Status: AC
  Filled 2014-04-12: qty 2

## 2014-04-12 MED ORDER — BUPIVACAINE HCL (PF) 0.5 % IJ SOLN
INTRAMUSCULAR | Status: DC | PRN
  Administered 2014-04-12: 5 mL

## 2014-04-12 MED ORDER — PROPOFOL 10 MG/ML IV BOLUS
INTRAVENOUS | Status: DC | PRN
  Administered 2014-04-12: 400 mg via INTRAVENOUS

## 2014-04-12 MED ORDER — FENTANYL CITRATE 0.05 MG/ML IJ SOLN
INTRAMUSCULAR | Status: DC | PRN
  Administered 2014-04-12: 100 ug via INTRAVENOUS
  Administered 2014-04-12: 50 ug via INTRAVENOUS
  Administered 2014-04-12: 250 ug via INTRAVENOUS

## 2014-04-12 MED ORDER — CALCIUM CARBONATE ANTACID 1000 MG PO CHEW: 1000.0000 mg | CHEWABLE_TABLET | Freq: Every day | ORAL | Status: DC

## 2014-04-12 MED ORDER — SODIUM CHLORIDE 0.9 % IV SOLN
INTRAVENOUS | Status: DC | PRN
  Administered 2014-04-12: 17:00:00 via INTRAVENOUS

## 2014-04-12 MED ORDER — HYDROCODONE-ACETAMINOPHEN 5-325 MG PO TABS
1.0000 | ORAL_TABLET | ORAL | Status: DC | PRN
  Administered 2014-04-13 (×2): 2 via ORAL
  Filled 2014-04-12 (×2): qty 2

## 2014-04-12 MED ORDER — BISACODYL 10 MG RE SUPP: 10.0000 mg | Freq: Every day | RECTAL | Status: DC | PRN

## 2014-04-12 MED ORDER — METFORMIN HCL 500 MG PO TABS
500.0000 mg | ORAL_TABLET | Freq: Two times a day (BID) | ORAL | Status: DC
  Administered 2014-04-13 – 2014-04-14 (×4): 500 mg via ORAL
  Filled 2014-04-12 (×4): qty 1

## 2014-04-12 MED ORDER — METOPROLOL TARTRATE 25 MG PO TABS
25.0000 mg | ORAL_TABLET | Freq: Two times a day (BID) | ORAL | Status: DC
  Administered 2014-04-12 – 2014-04-14 (×4): 25 mg via ORAL
  Filled 2014-04-12 (×4): qty 1

## 2014-04-12 MED ORDER — INSULIN ASPART 100 UNIT/ML ~~LOC~~ SOLN
4.0000 [IU] | Freq: Three times a day (TID) | SUBCUTANEOUS | Status: DC
  Administered 2014-04-13: 4 [IU] via SUBCUTANEOUS

## 2014-04-12 MED ORDER — EPHEDRINE SULFATE 50 MG/ML IJ SOLN
INTRAMUSCULAR | Status: DC | PRN
  Administered 2014-04-12 (×2): 25 mg via INTRAVENOUS

## 2014-04-12 MED ORDER — THROMBIN 20000 UNITS EX SOLR
CUTANEOUS | Status: DC | PRN
  Administered 2014-04-12: 14:00:00 via TOPICAL

## 2014-04-12 MED ORDER — LIDOCAINE HCL (CARDIAC) 20 MG/ML IV SOLN
INTRAVENOUS | Status: DC | PRN
  Administered 2014-04-12: 100 mg via INTRAVENOUS

## 2014-04-12 MED ORDER — ARTIFICIAL TEARS OP OINT
TOPICAL_OINTMENT | OPHTHALMIC | Status: AC
  Filled 2014-04-12: qty 3.5

## 2014-04-12 MED ORDER — PHENOL 1.4 % MT LIQD: 1.0000 | OROMUCOSAL | Status: DC | PRN

## 2014-04-12 MED ORDER — SENNA 8.6 MG PO TABS
1.0000 | ORAL_TABLET | Freq: Two times a day (BID) | ORAL | Status: DC
  Administered 2014-04-12 – 2014-04-14 (×4): 8.6 mg via ORAL
  Filled 2014-04-12 (×4): qty 1

## 2014-04-12 MED ORDER — SODIUM CHLORIDE 0.9 % IJ SOLN
INTRAMUSCULAR | Status: AC
  Filled 2014-04-12: qty 10

## 2014-04-12 MED ORDER — POTASSIUM CHLORIDE ER 10 MEQ PO TBCR
10.0000 meq | EXTENDED_RELEASE_TABLET | Freq: Every day | ORAL | Status: DC
  Administered 2014-04-13 – 2014-04-14 (×2): 10 meq via ORAL
  Filled 2014-04-12 (×3): qty 1

## 2014-04-12 MED ORDER — SUCCINYLCHOLINE CHLORIDE 20 MG/ML IJ SOLN
INTRAMUSCULAR | Status: DC | PRN
  Administered 2014-04-12: 140 mg via INTRAVENOUS
  Administered 2014-04-12: 60 mg via INTRAVENOUS

## 2014-04-12 MED ORDER — SENNOSIDES-DOCUSATE SODIUM 8.6-50 MG PO TABS: 1.0000 | ORAL_TABLET | Freq: Every evening | ORAL | Status: DC | PRN

## 2014-04-12 MED ORDER — PANTOPRAZOLE SODIUM 40 MG IV SOLR
40.0000 mg | Freq: Every day | INTRAVENOUS | Status: DC
  Administered 2014-04-12: 40 mg via INTRAVENOUS
  Filled 2014-04-12: qty 40

## 2014-04-12 MED ORDER — ADULT MULTIVITAMIN W/MINERALS CH
1.0000 | ORAL_TABLET | Freq: Every day | ORAL | Status: DC
  Administered 2014-04-13 – 2014-04-14 (×2): 1 via ORAL
  Filled 2014-04-12 (×2): qty 1

## 2014-04-12 MED ORDER — OXYCODONE HCL 5 MG PO TABS
5.0000 mg | ORAL_TABLET | Freq: Once | ORAL | Status: AC | PRN
  Administered 2014-04-12: 5 mg via ORAL

## 2014-04-12 MED ORDER — MENTHOL 3 MG MT LOZG: 1.0000 | LOZENGE | OROMUCOSAL | Status: DC | PRN

## 2014-04-12 MED ORDER — CYCLOBENZAPRINE HCL 10 MG PO TABS: 10.0000 mg | ORAL_TABLET | Freq: Three times a day (TID) | ORAL | Status: DC | PRN

## 2014-04-12 MED ORDER — OXYCODONE-ACETAMINOPHEN 5-325 MG PO TABS
1.0000 | ORAL_TABLET | ORAL | Status: DC | PRN
  Administered 2014-04-13 – 2014-04-14 (×3): 2 via ORAL
  Filled 2014-04-12 (×3): qty 2

## 2014-04-12 MED ORDER — ROPINIROLE HCL 1 MG PO TABS
1.0000 mg | ORAL_TABLET | Freq: Every day | ORAL | Status: DC
  Administered 2014-04-12 – 2014-04-13 (×2): 1 mg via ORAL
  Filled 2014-04-12 (×2): qty 1

## 2014-04-12 MED ORDER — DIAZEPAM 5 MG PO TABS
ORAL_TABLET | ORAL | Status: AC
  Administered 2014-04-12: 5 mg via ORAL
  Filled 2014-04-12: qty 1

## 2014-04-12 MED ORDER — ACETAMINOPHEN 650 MG RE SUPP: 650.0000 mg | RECTAL | Status: DC | PRN

## 2014-04-12 MED ORDER — SUCCINYLCHOLINE CHLORIDE 20 MG/ML IJ SOLN
INTRAMUSCULAR | Status: AC
  Filled 2014-04-12: qty 1

## 2014-04-12 MED ORDER — INSULIN ASPART 100 UNIT/ML ~~LOC~~ SOLN
0.0000 [IU] | Freq: Three times a day (TID) | SUBCUTANEOUS | Status: DC
  Administered 2014-04-13: 2 [IU] via SUBCUTANEOUS

## 2014-04-12 SURGICAL SUPPLY — 94 items
BAG DECANTER FOR FLEXI CONT (MISCELLANEOUS) ×3 IMPLANT
BENZOIN TINCTURE PRP APPL 2/3 (GAUZE/BANDAGES/DRESSINGS) IMPLANT
BLADE SURG 10 STRL SS (BLADE) ×3 IMPLANT
BLADE SURG ROTATE 9660 (MISCELLANEOUS) IMPLANT
BONE MATRIX OSTEOCEL PRO MED (Bone Implant) ×6 IMPLANT
BUR MATCHSTICK NEURO 3.0 LAGG (BURR) ×3 IMPLANT
BUR PRECISION FLUTE 5.0 (BURR) ×3 IMPLANT
CAGE COROENT LRG MP 8X9X28-12 (Cage) ×6 IMPLANT
CAGE PLIF 8X9X23-12 LUMBAR (Cage) ×6 IMPLANT
CANISTER SUCT 3000ML (MISCELLANEOUS) ×3 IMPLANT
CLIP NEUROVISION LG (CLIP) ×3 IMPLANT
CLOSURE WOUND 1/2 X4 (GAUZE/BANDAGES/DRESSINGS) ×1
CONT SPEC 4OZ CLIKSEAL STRL BL (MISCELLANEOUS) ×6 IMPLANT
COVER BACK TABLE 24X17X13 BIG (DRAPES) IMPLANT
COVER TABLE BACK 60X90 (DRAPES) ×3 IMPLANT
DERMABOND ADVANCED (GAUZE/BANDAGES/DRESSINGS) ×2
DERMABOND ADVANCED .7 DNX12 (GAUZE/BANDAGES/DRESSINGS) ×1 IMPLANT
DRAPE C-ARM 42X72 X-RAY (DRAPES) ×3 IMPLANT
DRAPE C-ARMOR (DRAPES) ×3 IMPLANT
DRAPE LAPAROTOMY 100X72X124 (DRAPES) ×3 IMPLANT
DRAPE POUCH INSTRU U-SHP 10X18 (DRAPES) ×3 IMPLANT
DRAPE SURG 17X23 STRL (DRAPES) ×3 IMPLANT
DRSG OPSITE POSTOP 4X6 (GAUZE/BANDAGES/DRESSINGS) ×3 IMPLANT
DRSG TELFA 3X8 NADH (GAUZE/BANDAGES/DRESSINGS) IMPLANT
DURAPREP 26ML APPLICATOR (WOUND CARE) ×3 IMPLANT
ELECT BLADE 4.0 EZ CLEAN MEGAD (MISCELLANEOUS) ×3
ELECT REM PT RETURN 9FT ADLT (ELECTROSURGICAL) ×3
ELECTRODE BLDE 4.0 EZ CLN MEGD (MISCELLANEOUS) ×1 IMPLANT
ELECTRODE REM PT RTRN 9FT ADLT (ELECTROSURGICAL) ×1 IMPLANT
EVACUATOR 1/8 PVC DRAIN (DRAIN) IMPLANT
GAUZE SPONGE 4X4 12PLY STRL (GAUZE/BANDAGES/DRESSINGS) IMPLANT
GAUZE SPONGE 4X4 16PLY XRAY LF (GAUZE/BANDAGES/DRESSINGS) IMPLANT
GLOVE BIO SURGEON STRL SZ 6.5 (GLOVE) ×2 IMPLANT
GLOVE BIO SURGEON STRL SZ8 (GLOVE) ×6 IMPLANT
GLOVE BIO SURGEONS STRL SZ 6.5 (GLOVE) ×1
GLOVE BIOGEL PI IND STRL 7.0 (GLOVE) ×1 IMPLANT
GLOVE BIOGEL PI IND STRL 7.5 (GLOVE) ×1 IMPLANT
GLOVE BIOGEL PI IND STRL 8 (GLOVE) ×2 IMPLANT
GLOVE BIOGEL PI IND STRL 8.5 (GLOVE) ×2 IMPLANT
GLOVE BIOGEL PI INDICATOR 7.0 (GLOVE) ×2
GLOVE BIOGEL PI INDICATOR 7.5 (GLOVE) ×2
GLOVE BIOGEL PI INDICATOR 8 (GLOVE) ×4
GLOVE BIOGEL PI INDICATOR 8.5 (GLOVE) ×4
GLOVE ECLIPSE 7.5 STRL STRAW (GLOVE) ×9 IMPLANT
GLOVE ECLIPSE 8.0 STRL XLNG CF (GLOVE) ×6 IMPLANT
GLOVE EXAM NITRILE LRG STRL (GLOVE) IMPLANT
GLOVE EXAM NITRILE MD LF STRL (GLOVE) ×6 IMPLANT
GLOVE EXAM NITRILE XL STR (GLOVE) IMPLANT
GLOVE EXAM NITRILE XS STR PU (GLOVE) IMPLANT
GOWN STRL REUS W/ TWL LRG LVL3 (GOWN DISPOSABLE) IMPLANT
GOWN STRL REUS W/ TWL XL LVL3 (GOWN DISPOSABLE) ×2 IMPLANT
GOWN STRL REUS W/TWL 2XL LVL3 (GOWN DISPOSABLE) ×6 IMPLANT
GOWN STRL REUS W/TWL LRG LVL3 (GOWN DISPOSABLE)
GOWN STRL REUS W/TWL XL LVL3 (GOWN DISPOSABLE) ×4
KIT BASIN OR (CUSTOM PROCEDURE TRAY) ×3 IMPLANT
KIT NEEDLE NVM5 EMG ELECT (KITS) ×1 IMPLANT
KIT NEEDLE NVM5 EMG ELECTRODE (KITS) ×2
KIT POSITION SURG JACKSON T1 (MISCELLANEOUS) ×3 IMPLANT
KIT ROOM TURNOVER OR (KITS) ×3 IMPLANT
MILL MEDIUM DISP (BLADE) ×3 IMPLANT
NEEDLE HYPO 21X1.5 SAFETY (NEEDLE) ×3 IMPLANT
NEEDLE HYPO 25X1 1.5 SAFETY (NEEDLE) ×3 IMPLANT
NEEDLE SPNL 18GX3.5 QUINCKE PK (NEEDLE) IMPLANT
NS IRRIG 1000ML POUR BTL (IV SOLUTION) ×3 IMPLANT
PACK LAMINECTOMY NEURO (CUSTOM PROCEDURE TRAY) ×3 IMPLANT
PAD ARMBOARD 7.5X6 YLW CONV (MISCELLANEOUS) ×9 IMPLANT
PATTIES SURGICAL .5 X.5 (GAUZE/BANDAGES/DRESSINGS) IMPLANT
PATTIES SURGICAL .5 X1 (DISPOSABLE) IMPLANT
PATTIES SURGICAL 1X1 (DISPOSABLE) IMPLANT
ROD 55MM (Rod) ×4 IMPLANT
ROD SPNL 55XPREBNT NS MAS (Rod) ×2 IMPLANT
SCREW LOCK (Screw) ×12 IMPLANT
SCREW LOCK FXNS SPNE MAS PL (Screw) ×6 IMPLANT
SCREW MAS PLIF POLY 5.0X40 3Z (Screw) ×9 IMPLANT
SCREW PLIF MAS 5.0X35 (Screw) ×3 IMPLANT
SCREW SHANK 5.5X30MM (Screw) ×6 IMPLANT
SCREW TULIP 5.5 (Screw) ×6 IMPLANT
SPONGE LAP 4X18 X RAY DECT (DISPOSABLE) IMPLANT
SPONGE SURGIFOAM ABS GEL 100 (HEMOSTASIS) ×3 IMPLANT
STAPLER SKIN PROX WIDE 3.9 (STAPLE) IMPLANT
STRIP CLOSURE SKIN 1/2X4 (GAUZE/BANDAGES/DRESSINGS) ×2 IMPLANT
SUT VIC AB 1 CT1 18XBRD ANBCTR (SUTURE) ×2 IMPLANT
SUT VIC AB 1 CT1 8-18 (SUTURE) ×4
SUT VIC AB 2-0 CT1 18 (SUTURE) ×6 IMPLANT
SUT VIC AB 3-0 SH 8-18 (SUTURE) ×6 IMPLANT
SYR 20CC LL (SYRINGE) ×3 IMPLANT
SYR 20ML ECCENTRIC (SYRINGE) ×3 IMPLANT
SYR 3ML LL SCALE MARK (SYRINGE) ×12 IMPLANT
SYR 5ML LL (SYRINGE) IMPLANT
TOWEL OR 17X24 6PK STRL BLUE (TOWEL DISPOSABLE) ×3 IMPLANT
TOWEL OR 17X26 10 PK STRL BLUE (TOWEL DISPOSABLE) ×3 IMPLANT
TRAP SPECIMEN MUCOUS 40CC (MISCELLANEOUS) ×3 IMPLANT
TRAY FOLEY CATH 14FRSI W/METER (CATHETERS) ×3 IMPLANT
WATER STERILE IRR 1000ML POUR (IV SOLUTION) ×3 IMPLANT

## 2014-04-12 NOTE — Brief Op Note (Signed)
04/12/2014  5:52 PM  PATIENT:  Mark Morris  63 y.o. male  PRE-OPERATIVE DIAGNOSIS:  Spondylolisthesis, Spondylosis, Recurrent Lumbar herniated nucleus pulposus without myelopathy, Lumbar degenerative disc disease L 45 and L 5 S 1 levels  POST-OPERATIVE DIAGNOSIS:  Spondylolisthesis, Spondylosis, Recurrent Lumbar herniated nucleus pulposus without myelopathy, Lumbar degenerative disc disease L 45 and L 5 S 1 levels  PROCEDURE:  Procedure(s) with comments: Lumbar Four-Five Lumbar Five-Sacral One Redo Laminectomy and Posterior Lumbar Interbody Fusion (N/A) - Lumbar Four-Five Lumbar Five-Sacral One Redo Laminectomy and Posterior Lumbar Interbody Fusion with pedicle screw fixation and posterolateral arthrodesis with autograft and allograft  Decompression greater than typical PLIF procedure  SURGEON:  Surgeon(s) and Role:    * Erline Levine, MD - Primary    * Elaina Hoops, MD - Assisting  PHYSICIAN ASSISTANT:   ASSISTANTS: Poteat, RN   ANESTHESIA:   general  EBL:  Total I/O In: 3050 [I.V.:2950; Blood:100] Out: 9509 [Urine:1325; Blood:350]  BLOOD ADMINISTERED:250  CC CELLSAVER  DRAINS: none   LOCAL MEDICATIONS USED:  MARCAINE     SPECIMEN:  No Specimen  DISPOSITION OF SPECIMEN:  N/A  COUNTS:  YES  TOURNIQUET:  * No tourniquets in log *  DICTATION: Patient is a 63 year old with spondylosis , stenosis, spondylolisthesis, disc herniation and severe back and bilateral lower extremity pain and weakness at L4/5 levels of the lumbar spine. It was elected to take her to surgery for MASPLIF L 45 level with posterolateral arthrodesis.  Procedure:   Following uncomplicated induction of GETA, and placement of electrodes for neural monitoring, patient was turned into a prone position on the Egan tableand using AP  fluoroscopy the area of planned incision was marked, prepped with betadine scrub and Duraprep, then draped. Exposure was performed of facet joint complex at L 45 and L 5 S 1  levels and the MAS retractor was placed.5.5 x 35 mm cortical Nuvasive screws were placed at L 4 bilaterally according to standard landmarks using neural monitoring.  A total laminectomy of L 4 and redo L 5 laminectomy was then performed with disarticulation of facets.  This bone was saved for grafting, combined with Osteocel after being run through bone mill and was placed in bone packing device.  Thorough discectomy was performed bilaterally at L 45 and L 5 S 1 levels and the endplates were prepared for grafting.  23 x 10 x 12 degree cages were placed in the interspace and positioning was confirmed with AP and lateral fluoroscopy.  10 cc of autograft/Osteocel was packed in the interspace medial to the second cage.   23 x 8 x 12 degree cages were placed in the interspace and positioning was confirmed with AP and lateral fluoroscopy at L 5 S 1 level. Decompression was in excess of typical PLIF procedure with painstaking decompression of all neural elements through scar, including both L4 , L 5 , ans S 1 nerve roots. Remaining screws were placed at L 5 and S 1 and 55 mm rods were placed.   And the screws were locked and torqued.Final Xrays showed well positioned implants and screw fixation. The posterolateral region was packed with remaining 20 cc of autograft on the both sides of midline. The wounds were irrigated and then closed with 1, 2-0 and 3-0 Vicryl stitches. 20 cc long-acting Marcaine was injected in the subcutaneous tissues. Sterile occlusive dressing was placed with Dermabond. The patient was then extubated in the operating room and taken to recovery in stable and satisfactory  condition having tolerated her operation well. Counts were correct at the end of the case.  PLAN OF CARE: Admit to inpatient   PATIENT DISPOSITION:  PACU - hemodynamically stable.   Delay start of Pharmacological VTE agent (>24hrs) due to surgical blood loss or risk of bleeding: yes

## 2014-04-12 NOTE — Transfer of Care (Signed)
Immediate Anesthesia Transfer of Care Note  Patient: Mark Morris  Procedure(s) Performed: Procedure(s) with comments: Lumbar Four-Five Lumbar Five-Sacral One Redo Laminectomy and Posterior Lumbar Interbody Fusion (N/A) - Lumbar Four-Five Lumbar Five-Sacral One Redo Laminectomy and Posterior Lumbar Interbody Fusion  Patient Location: PACU  Anesthesia Type:General  Level of Consciousness: sedated and responds to stimulation  Airway & Oxygen Therapy: Patient Spontanous Breathing and Patient connected to face mask oxygen  Post-op Assessment: Report given to PACU RN, Post -op Vital signs reviewed and stable and Patient moving all extremities  Post vital signs: Reviewed and stable  Complications: No apparent anesthesia complications

## 2014-04-12 NOTE — Anesthesia Postprocedure Evaluation (Signed)
  Anesthesia Post-op Note  Patient: Mark Morris  Procedure(s) Performed: Procedure(s) with comments: Lumbar Four-Five Lumbar Five-Sacral One Redo Laminectomy and Posterior Lumbar Interbody Fusion (N/A) - Lumbar Four-Five Lumbar Five-Sacral One Redo Laminectomy and Posterior Lumbar Interbody Fusion  Patient Location: PACU  Anesthesia Type: General   Level of Consciousness: awake, alert  and oriented  Airway and Oxygen Therapy: Patient Spontanous Breathing  Post-op Pain: mild  Post-op Assessment: Post-op Vital signs reviewed  Post-op Vital Signs: Reviewed  Last Vitals:  Filed Vitals:   04/12/14 1815  BP: 110/53  Pulse: 73  Temp:   Resp: 5    Complications: No apparent anesthesia complications

## 2014-04-12 NOTE — Anesthesia Preprocedure Evaluation (Signed)
Anesthesia Evaluation  Patient identified by MRN, date of birth, ID band Patient awake    Reviewed: Allergy & Precautions, H&P , NPO status , Patient's Chart, lab work & pertinent test results  Airway Mallampati: II  Neck ROM: full    Dental   Pulmonary shortness of breath, sleep apnea , COPDCurrent Smoker,          Cardiovascular hypertension, + dysrhythmias Supra Ventricular Tachycardia  Nonobstructive by cardiac catheterization 5/04: Proximal RCA 25%, EF 65%;  Myoview 12/05: no scar or ischemia, EF 58%   Neuro/Psych Anxiety Depression    GI/Hepatic   Endo/Other  diabetes, Type 2obese  Renal/GU      Musculoskeletal   Abdominal   Peds  Hematology   Anesthesia Other Findings   Reproductive/Obstetrics                           Anesthesia Physical Anesthesia Plan  ASA: III  Anesthesia Plan: General   Post-op Pain Management:    Induction: Intravenous  Airway Management Planned: Oral ETT  Additional Equipment:   Intra-op Plan:   Post-operative Plan: Extubation in OR  Informed Consent: I have reviewed the patients History and Physical, chart, labs and discussed the procedure including the risks, benefits and alternatives for the proposed anesthesia with the patient or authorized representative who has indicated his/her understanding and acceptance.     Plan Discussed with: CRNA, Anesthesiologist and Surgeon  Anesthesia Plan Comments:         Anesthesia Quick Evaluation

## 2014-04-12 NOTE — Op Note (Signed)
04/12/2014  5:52 PM  PATIENT:  Mark Morris  63 y.o. male  PRE-OPERATIVE DIAGNOSIS:  Spondylolisthesis, Spondylosis, Recurrent Lumbar herniated nucleus pulposus without myelopathy, Lumbar degenerative disc disease L 45 and L 5 S 1 levels  POST-OPERATIVE DIAGNOSIS:  Spondylolisthesis, Spondylosis, Recurrent Lumbar herniated nucleus pulposus without myelopathy, Lumbar degenerative disc disease L 45 and L 5 S 1 levels  PROCEDURE:  Procedure(s) with comments: Lumbar Four-Five Lumbar Five-Sacral One Redo Laminectomy and Posterior Lumbar Interbody Fusion (N/A) - Lumbar Four-Five Lumbar Five-Sacral One Redo Laminectomy and Posterior Lumbar Interbody Fusion with pedicle screw fixation and posterolateral arthrodesis with autograft and allograft  Decompression greater than typical PLIF procedure  SURGEON:  Surgeon(s) and Role:    * Erline Levine, MD - Primary    * Elaina Hoops, MD - Assisting  PHYSICIAN ASSISTANT:   ASSISTANTS: P[place], RN   ANESTHESIA:   general  EBL:  Total I/O In: 3050 [I.V.:2950; Blood:100] Out: 9833 [Urine:1325; Blood:350]  BLOOD ADMINISTERED:250  CC CELLSAVER  DRAINS: none   LOCAL MEDICATIONS USED:  MARCAINE     SPECIMEN:  No Specimen  DISPOSITION OF SPECIMEN:  N/A  COUNTS:  YES  TOURNIQUET:  * No tourniquets in log *  DICTATION: Patient is a 63 year old with spondylosis , stenosis, spondylolisthesis, disc herniation and severe back and bilateral lower extremity pain and weakness at L4/5 levels of the lumbar spine. It was elected to take her to surgery for MASPLIF L 45 level with posterolateral arthrodesis.  Procedure:   Following uncomplicated induction of GETA, and placement of electrodes for neural monitoring, patient was turned into a prone position on the [place] tableand using AP  fluoroscopy the area of planned incision was marked, prepped with betadine scrub and Duraprep, then draped. Exposure was performed of facet joint complex at L 45 and L 5 S 1  levels and the MAS retractor was placed.5.5 x 35 mm cortical Nuvasive screws were placed at L 4 bilaterally according to standard landmarks using neural monitoring.  A total laminectomy of L 4 and redo L 5 laminectomy was then performed with disarticulation of facets.  This bone was saved for grafting, combined with Osteocel after being run through bone mill and was placed in bone packing device.  Thorough discectomy was performed bilaterally at L 45 and L 5 S 1 levels and the endplates were prepared for grafting.  23 x 10 x 12 degree cages were placed in the interspace and positioning was confirmed with AP and lateral fluoroscopy.  10 cc of autograft/Osteocel was packed in the interspace medial to the second cage.   23 x 8 x 12 degree cages were placed in the interspace and positioning was confirmed with AP and lateral fluoroscopy at L 5 S 1 level. Decompression was in excess of typical PLIF procedure with painstaking decompression of all neural elements through scar, including both L4 , L 5 , ans S 1 nerve roots. Remaining screws were placed at L 5 and S 1 and 55 mm rods were placed.   And the screws were locked and torqued.Final Xrays showed well positioned implants and screw fixation. The posterolateral region was packed with remaining 20 cc of autograft on the both sides of midline. The wounds were irrigated and then closed with 1, 2-0 and 3-0 Vicryl stitches. 20 cc long-acting Marcaine was injected in the subcutaneous tissues. Sterile occlusive dressing was placed with Dermabond. The patient was then extubated in the operating room and taken to recovery in stable and satisfactory  condition having tolerated her operation well. Counts were correct at the end of the case.  PLAN OF CARE: Admit to inpatient   PATIENT DISPOSITION:  PACU - hemodynamically stable.   Delay start of Pharmacological VTE agent (>24hrs) due to surgical blood loss or risk of bleeding: yes

## 2014-04-12 NOTE — H&P (Signed)
[place], [place] [address] Phone: [phone]   Patient ID:   [id number] Patient: Mark Morris  Date of Birth: 01-19-1951 Visit Type: Office Visit   Date: 03/20/2014 10:00 AM Provider: Marchia Meiers. Vertell Limber MD   This 63 year old male presents for Follow Up of back pain.  History of Present Illness: 1.  Follow Up of back pain  02-26-14  left L5-S1 TF ESI by DrHarkins...no relief 01-29-14  L4-5 ESI  by DrHarkins...no relief  12-18-13 left CTR ... healed incision, occasional soreness, improved fxn  takes Norco 10/325 1-2/day - ran out last week.  Hopes for other pain relief option.  Patient has not had any relief of his low back pain and he describes this is intolerable.  He has been unable to gain relief with physical therapy and/or injection therapy.  I reviewed his MRI of his lumbar spine which shows recurrent disc herniation and spondylosis with severe disc space height loss at the L5 S1 level anterolisthesis at this level.  He has caudally migrated disc material with persistent pressure on the S1 nerve root.  Additionally he has a left-sided disc protrusion at L4 L5 onthis level.  This is causing some left lateral recess stenosis.  He has minimal degenerative changes at the L3 L4 level.  The patient is continuing to complain of low back and left greater than right lower extremity pain.  I once again to have urged him to lose weight and improve physical conditioning.  He says this point he is not in a position to be able do exercises because of the severity is pain and wants to go ahead with surgery.      Medical/Surgical/Interim History Reviewed, no change.  Last detailed document date:12/03/2013.   PAST MEDICAL HISTORY, SURGICAL HISTORY, FAMILY HISTORY, SOCIAL HISTORY AND REVIEW OF SYSTEMS I have reviewed the patient's past medical, surgical, family and social history as well as the comprehensive review of systems as included on the [facility] history form dated 11/01/2013, which I have signed.  Family History: Reviewed, no changes.  Last detailed document: 12/03/2013.   Social History: Tobacco use reviewed. Reviewed, no changes. Last detailed document date: 12/03/2013.      MEDICATIONS(added, continued or stopped this visit):   Started Medication Directions Instruction Stopped   Allegra 180 mg tablet take 1 tablet by oral route  every day     alprazolam 0.5 mg tablet take 1 tablet by oral route 2 times every day as needed     Calcium Antacid Ultra Max St 400 mg (1,000 mg) chewable tablet Chew 1000 mg daily     cyclobenzaprine 10 mg tablet take 1 tablet by oral route 3 times every day as needed     doxazosin 2 mg tablet take 1 tablet by oral route  every day     doxazosin 4 mg tablet take 1 tablet by oral route  every day     finasteride 5 mg tablet take 1 tablet by oral route  every day     fluoxetine 40 mg capsule take 1 capsule by oral route  every day in the morning     guaifenesin 400 mg tablet take 1 tablet by oral route  every 6 hours as needed    01/28/2014 hydrocodone 10 mg-acetaminophen 325 mg tablet take 1 tablet by oral route  every 4 - 6 hours as needed for pain  03/20/2014  03/20/2014 hydrocodone 10 mg-acetaminophen 325 mg tablet  take 1 tablet by oral route  every 4 - 6 hours as needed for pain     K-DUR 10 MEQ ORAL 1 tablet daily     lisinopril 20 mg tablet take 1 tablet by oral route  every day     meloxicam 15 mg tablet take 1 tablet by oral route  every day     metformin 500 mg tablet take 1 tablet by oral route  every day     metoprolol tartrate 25 mg tablet take 1 tablet by oral route 2 times every day     multivitamin tablet 1 tablet daily    12/03/2013 Percocet 5 mg-325 mg tablet take 1-2 tablets by oral route  every 4 hours as needed for pain     Proventil HFA 90 mcg/actuation aerosol inhaler inhale 2 puff by inhalation route  every 6 hours as needed     ropinirole 1  mg tablet take 1 tablet by oral route  every day     simvastatin 20 mg tablet take 1 tablet by oral route  every day in the evening     Spiriva with HandiHaler 18 mcg & inhalation capsules inhale 1 capsule by inhalation route  every day using 2 inhalations via handihaler     Symbicort 160 mcg-4.5 mcg/actuation HFA aerosol inhaler inhale 1 puff by inhalation route 2 times every day in the morning and evening     trazodone 50 mg tablet take 1/2 - 1 tablet by oral route  every day as needed for sleep      ALLERGIES:  Ingredient Reaction Medication Name Comment  CODEINE Nausea/Vomiting    SULFA (SULFONAMIDE ANTIBIOTICS) Nausea/Vomiting    Reviewed, no changes.   Vitals Date Temp F BP Pulse Ht In Wt Lb BMI BSA Pain Score  03/20/2014  153/80 61 71 241 33.61  8/10        IMPRESSION Lumbar spondylosis, spondylolisthesis, recurrent disc protrusion, nerve root compression left greater than right L5-S1 greater than L4 L5 levels.  Completed Orders (this encounter) Order Details Reason Side Interpretation Result Initial Treatment Date Region  Lifestyle education regarding diet Encouraged to eat a well balanced diet and follow up with primary care physician.        Hypertension education Follow up with primary care physician.         Assessment/Plan # Detail Type Description   1. Assessment Acquired spondylolisthesis (738.4).       2. Assessment Lumbar spondylosis (721.3).       3. Assessment Herniated lumbar intervertebral disc (722.10).       4. Assessment Lumbar radiculopathy (724.4).       5. Assessment Lumbar disc degenerative disease (722.52).       6. Assessment BMI 33.0-33.9,ADULT (V85.33).   Plan Orders Today's instructions / counseling include(s) Lifestyle education regarding diet.       7. Assessment Hypertension, Unspecified (401.9).         Pain Assessment/Treatment Pain Scale: 8/10. Method: Numeric Pain Intensity Scale. Location: back. Onset:  06/04/2013. Duration: varies. Quality: sharp. Pain Assessment/Treatment follow-up plan of care: Patient currently taking pain medication as prescribed..  Lumbar redo decompression with posterior lumbar interbody fusion L4 L5 and L5-S1 levels.  Risks and benefits were discussed in detail with the patient wished to proceed with surgery.  He was fitted for LSO brace.  Nursing education was performed today.  Orders: Instruction(s)/Education: Assessment Instruction  401.9 Hypertension education  V85.33 Lifestyle education regarding diet    MEDICATIONS PRESCRIBED  TODAY    Rx Quantity Refills  HYDROCODONE-ACETAMINOPHEN 10 mg-325 mg  90 0            Provider:  Marchia Meiers. Vertell Limber MD  03/20/2014 10:24 AM Dictation edited by: Marchia Meiers. Vertell Limber    CC Providers: Erline Levine MD [address] [place], [place] [address] ----------------------------------------------------------------------------------------------------------------------------------------------------------------------         Electronically signed by Marchia Meiers. Vertell Limber MD on 03/20/2014 10:25 AM

## 2014-04-12 NOTE — Interval H&P Note (Signed)
History and Physical Interval Note:  04/12/2014 6:09 AM  Mark Morris  has presented today for surgery, with the diagnosis of Spondylolisthesis, Spondylosis, Lumbar hnp without myelopathy, Lumbar degenerative disc disease  The various methods of treatment have been discussed with the patient and family. After consideration of risks, benefits and other options for treatment, the patient has consented to  Procedure(s) with comments: FOR MAXIMUM ACCESS (MAS) POSTERIOR LUMBAR INTERBODY FUSION (PLIF) 2 LEVEL (N/A) - L4-5 L5-S1 Redo laminectomy and posterior lumbar interbody fusion as a surgical intervention .  The patient's history has been reviewed, patient examined, no change in status, stable for surgery.  I have reviewed the patient's chart and labs.  Questions were answered to the patient's satisfaction.     Avice Funchess D

## 2014-04-12 NOTE — Progress Notes (Signed)
Awake, alert, conversant.  Full strength both lower extremities.  Doing well.  

## 2014-04-13 LAB — GLUCOSE, CAPILLARY
GLUCOSE-CAPILLARY: 128 mg/dL — AB (ref 70–99)
GLUCOSE-CAPILLARY: 103 mg/dL — AB (ref 70–99)
Glucose-Capillary: 105 mg/dL — ABNORMAL HIGH (ref 70–99)
Glucose-Capillary: 112 mg/dL — ABNORMAL HIGH (ref 70–99)

## 2014-04-13 LAB — HEMOGLOBIN A1C
HEMOGLOBIN A1C: 6.2 % — AB (ref <5.7)
Mean Plasma Glucose: 131 mg/dL — ABNORMAL HIGH (ref <117)

## 2014-04-13 MED ORDER — PANTOPRAZOLE SODIUM 40 MG PO TBEC
40.0000 mg | DELAYED_RELEASE_TABLET | Freq: Every day | ORAL | Status: DC
Start: 2014-04-13 — End: 2014-04-14
  Administered 2014-04-13: 40 mg via ORAL

## 2014-04-13 NOTE — Progress Notes (Signed)
Filed Vitals:   04/12/14 1941 04/12/14 2201 04/13/14 0149 04/13/14 0541  BP: 113/61 83/41 102/56 95/49  Pulse: 83 87 57 67  Temp: 97.9 F (36.6 C) 98.1 F (36.7 C) 98.3 F (36.8 C) 97.4 F (36.3 C)  TempSrc:  Oral Oral Oral  Resp: 14 18 18 18   Height:      Weight:      SpO2: 94% 93% 96% 96%    Patient sitting up in his chair, comfortable. Has ambulated some in the room, using a rolling walker. Dressing clean and dry per nursing staff. Awaiting PT, seen by OT.  Plan: Encouraged patient to ambulate in the halls, if needed using a rolling walker initially, but ideally progressing to ambulating without the rolling walker.  Hosie Spangle, MD 04/13/2014, 9:17 AM

## 2014-04-13 NOTE — Evaluation (Signed)
Physical Therapy Evaluation Patient Details Name: Mark Morris MRN: [id number] DOB: 05/24/1951 Today's Date: 04/13/2014   History of Present Illness  63 y.o. s/p Lumbar Four-Five Lumbar Five-Sacral One Redo Laminectomy and Posterior Lumbar Interbody Fusion  Clinical Impression  Patient is s/p lumbar fusion surgery resulting in functional limitations due to the deficits listed below (see PT Problem List). Pt moving well with mild lower back pain.  Pt will need to practice stair negotiation prior to d/c home. Patient will benefit from skilled PT to increase their independence and safety with mobility to allow discharge to the venue listed below.      Follow Up Recommendations No PT follow up;Supervision - Intermittent    Equipment Recommendations  None recommended by PT    Recommendations for Other Services       Precautions / Restrictions Precautions Precautions: Back;Fall Precaution Booklet Issued: No Precaution Comments: Reviewed precautions with pt Required Braces or Orthoses: Spinal Brace Spinal Brace: Lumbar corset;Applied in sitting position Restrictions Weight Bearing Restrictions: No      Mobility  Bed Mobility Overal bed mobility: Needs Assistance Bed Mobility: Rolling;Sidelying to Sit Rolling: Supervision Sidelying to sit: Supervision       General bed mobility comments: educated on log roll technique.  Transfers Overall transfer level: Needs assistance Equipment used: Rolling walker (2 wheeled);None Transfers: Sit to/from Stand Sit to Stand: Min assist         General transfer comment: cues for technique.  Ambulation/Gait Ambulation/Gait assistance: Min guard Ambulation Distance (Feet): 200 Feet Assistive device: Rolling walker (2 wheeled) Gait Pattern/deviations: Step-through pattern;Decreased stride length Gait velocity: pt with increased gait speed Gait velocity interpretation: >2.62 ft/sec, indicative of independent community  ambulator General Gait Details: Pt ambulates leaning heavily on RW with bilateral UE for support to relieve back pain  Stairs            Wheelchair Mobility    Modified Rankin (Stroke Patients Only)       Balance                                             Pertinent Vitals/Pain Pain Assessment: 0-10 Pain Score: 4  Pain Location: back Pain Descriptors / Indicators:  (soreness) Pain Intervention(s): Repositioned;Premedicated before session    Home Living Family/patient expects to be discharged to:: Private residence Living Arrangements: Spouse/significant other Available Help at Discharge: Family;Available 24 hours/day Type of Home: House Home Access: Stairs to enter Entrance Stairs-Rails: None Entrance Stairs-Number of Steps: 2 Home Layout: One level Home Equipment: Walker - 2 wheels;Cane - single point;Bedside commode;Shower seat;Tub bench;Adaptive equipment      Prior Function Level of Independence: Independent               Hand Dominance   Dominant Hand: Right    Extremity/Trunk Assessment   Upper Extremity Assessment: Defer to OT evaluation           Lower Extremity Assessment: Overall WFL for tasks assessed         Communication   Communication: No difficulties  Cognition Arousal/Alertness: Awake/alert Behavior During Therapy: Flat affect Overall Cognitive Status: Within Functional Limits for tasks assessed                      General Comments      Exercises        Assessment/Plan  PT Assessment Patient needs continued PT services  PT Diagnosis Difficulty walking;Acute pain   PT Problem List Decreased balance;Decreased mobility;Decreased knowledge of use of DME;Pain  PT Treatment Interventions DME instruction;Gait training;Stair training;Functional mobility training;Patient/family education   PT Goals (Current goals can be found in the Care Plan section) Acute Rehab PT Goals Patient Stated  Goal: not stated PT Goal Formulation: With patient Time For Goal Achievement: 04/20/14 Potential to Achieve Goals: Good    Frequency Min 5X/week   Barriers to discharge        Co-evaluation               End of Session Equipment Utilized During Treatment: Gait belt;Back brace Activity Tolerance: Patient tolerated treatment well Patient left: in chair;with call bell/phone within reach Nurse Communication: Mobility status         Time: 4696-2952 PT Time Calculation (min): 20 min   Charges:   PT Evaluation $Initial PT Evaluation Tier I: 1 Procedure PT Treatments $Gait Training: 8-22 mins   PT G Codes:          Kennedee Kitzmiller 05/08/2014, 12:16 PM Antoine Poche, [place] DPT [phone]

## 2014-04-13 NOTE — Evaluation (Signed)
Occupational Therapy Evaluation Patient Details Name: Mark Morris MRN: [id number] DOB: 11-Jan-1951 Today's Date: 04/13/2014    History of Present Illness 63 y.o. s/p Lumbar Four-Five Lumbar Five-Sacral One Redo Laminectomy and Posterior Lumbar Interbody Fusion   Clinical Impression   Pt s/p above. Pt independent with ADLs, PTA. Feel pt will benefit from acute OT to increase independence prior to d/c.    Follow Up Recommendations  No OT follow up;Supervision - Intermittent    Equipment Recommendations  None recommended by OT    Recommendations for Other Services       Precautions / Restrictions Precautions Precautions: Back;Fall Precaution Booklet Issued: No Precaution Comments: Reviewed precautions with pt Required Braces or Orthoses: Spinal Brace Spinal Brace: Lumbar corset;Applied in sitting position Restrictions Weight Bearing Restrictions: No      Mobility Bed Mobility Overal bed mobility: Needs Assistance Bed Mobility: Rolling;Sidelying to Sit Rolling: Supervision Sidelying to sit: Supervision       General bed mobility comments: educated on log roll technique.  Transfers Overall transfer level: Needs assistance Equipment used: Rolling walker (2 wheeled);None Transfers: Sit to/from Stand Sit to Stand: Min assist         General transfer comment: cues for technique.    Balance                                            ADL Overall ADL's : Needs assistance/impaired     Grooming: Wash/dry face;Set up;Supervision/safety;Standing           Upper Body Dressing : Sitting;Standing;Moderate assistance       Toilet Transfer: Minimal assistance;Ambulation;RW;Min guard (chair)   Toileting- Clothing Manipulation and Hygiene: Minimal assistance (standing)       Functional mobility during ADLs: Min guard;Minimal assistance;Rolling walker (walked with and without walker) General ADL Comments: Educated on use of cup for teeth  care and placement of grooming items to avoid breaking precautions. Educated on use of AE for LB ADLs as well as alternative technique. Educated on use of bag on walker, safe shoewear, and talked about rugs. Educated on back brace.  Educated on what to use for toilet aid. Pt states wife will assist him with LB ADLs.     Vision                     Perception     Praxis      Pertinent Vitals/Pain Pain Assessment: 0-10 Pain Score: 4  Pain Location: back Pain Descriptors / Indicators: Sore Pain Intervention(s): Repositioned;Other (comment) (notified nurse)     Hand Dominance Right   Extremity/Trunk Assessment Upper Extremity Assessment Upper Extremity Assessment: Overall WFL for tasks assessed   Lower Extremity Assessment Lower Extremity Assessment: Defer to PT evaluation       Communication Communication Communication: No difficulties   Cognition Arousal/Alertness: Awake/alert Behavior During Therapy: Flat affect Overall Cognitive Status: Within Functional Limits for tasks assessed                     General Comments       Exercises       Shoulder Instructions      Home Living Family/patient expects to be discharged to:: Private residence Living Arrangements: Spouse/significant other Available Help at Discharge: Family;Available 24 hours/day Type of Home: House Home Access: Stairs to enter [employer] of Steps: 2 Entrance Stairs-Rails: None  Home Layout: One level     Bathroom Shower/Tub: Tub/shower unit;Walk-in shower   Bathroom Toilet: Standard     Home Equipment: Environmental consultant - 2 wheels;Cane - single point;Bedside commode;Shower seat;Tub bench;Adaptive equipment Adaptive Equipment: Reacher;Sock aid;Long-handled shoe horn;Long-handled sponge        Prior Functioning/Environment Level of Independence: Independent             OT Diagnosis: Acute pain;Generalized weakness   OT Problem List: Decreased strength;Decreased  activity tolerance;Impaired balance (sitting and/or standing);Decreased knowledge of use of DME or AE;Decreased knowledge of precautions;Pain;Decreased range of motion   OT Treatment/Interventions: Self-care/ADL training;DME and/or AE instruction;Therapeutic activities;Patient/family education;Balance training    OT Goals(Current goals can be found in the care plan section) Acute Rehab OT Goals Patient Stated Goal: not stated OT Goal Formulation: With patient Time For Goal Achievement: 04/20/14 Potential to Achieve Goals: Good ADL Goals Pt Will Perform Grooming: with modified independence;standing Pt Will Transfer to Toilet: with modified independence;ambulating (3 in 1 over commode) Pt Will Perform Toileting - Clothing Manipulation and hygiene: with modified independence;sit to/from stand Pt Will Perform Tub/Shower Transfer: Tub transfer;with supervision;ambulating;tub bench;shower seat;rolling walker;Shower transfer  OT Frequency: Min 2X/week   Barriers to D/C:            Co-evaluation              End of Session Equipment Utilized During Treatment: Gait belt;Rolling walker;Back brace Nurse Communication: Mobility status;Other (comment) (pain level)  Activity Tolerance: Patient tolerated treatment well Patient left: in chair;with call bell/phone within reach   Time: 0807-0832 OT Time Calculation (min): 25 min Charges:  OT General Charges $OT Visit: 1 Procedure OT Evaluation $Initial OT Evaluation Tier I: 1 Procedure OT Treatments $Self Care/Home Management : 8-22 mins G-CodesBenito Mccreedy OTR/L [phone] 04/13/2014, 8:50 AM

## 2014-04-14 LAB — GLUCOSE, CAPILLARY
GLUCOSE-CAPILLARY: 97 mg/dL (ref 70–99)
Glucose-Capillary: 94 mg/dL (ref 70–99)
Glucose-Capillary: 109 mg/dL — ABNORMAL HIGH (ref 70–99)

## 2014-04-14 MED ORDER — OXYCODONE-ACETAMINOPHEN 5-325 MG PO TABS: 1.0000 | ORAL_TABLET | ORAL | Status: DC | PRN

## 2014-04-14 NOTE — Progress Notes (Signed)
Discharged home with family, self care. No PT/OT follow up recommended per Therapists. Pt states no walker needed; that he has one at home.

## 2014-04-14 NOTE — Progress Notes (Signed)
Occupational Therapy Treatment Patient Details Name: Mark Morris MRN: [id number] DOB: 14-Apr-1951 Today's Date: 04/14/2014    History of present illness 63 y.o. s/p Lumbar Four-Five Lumbar Five-Sacral One Redo Laminectomy and Posterior Lumbar Interbody Fusion   OT comments  Pt progressing. Pt states he feels good about information covered. Pt will have wife assisting him at home. OT signing off.  Follow Up Recommendations  No OT follow up;Supervision - Intermittent    Equipment Recommendations  None recommended by OT    Recommendations for Other Services      Precautions / Restrictions Precautions Precautions: Back;Fall Precaution Booklet Issued: No Precaution Comments: Reviewed precautions Required Braces or Orthoses: Spinal Brace Spinal Brace: Lumbar corset;Applied in sitting position Restrictions Weight Bearing Restrictions: No       Mobility Bed Mobility Overal bed mobility: Needs Assistance Bed Mobility: Sidelying to Sit Sidelying to sit: Supervision       General bed mobility comments: cues for technique.  Transfers Overall transfer level: Needs assistance Equipment used: Rolling walker (2 wheeled) Transfers: Sit to/from Stand Sit to Stand: Min guard         General transfer comment: Min guard for safety. Cues for hand placement.        ADL Overall ADL's : Needs assistance/impaired                 Upper Body Dressing : Minimal assistance;Standing;Sitting (brace)   Lower Body Dressing: Total assistance;Sitting/lateral leans (socks)   Toilet Transfer: Supervision/safety;Ambulation;RW (chair)       Tub/ Shower Transfer: Min guard;Ambulation;Shower seat;Rolling walker (lots of cues)   Functional mobility during ADLs: Min guard;Supervision/safety;Rolling walker General ADL Comments: Reviewed to use bag on walker. Spoke briefly about AE. Pt states he thinks wife is going to assist with this. Reviewed to use cup for teeth care and placement  of grooming items to avoid breaking precautions. Educated on back brace. Practiced shower transfer and recommended spouse be with him for this.       Vision                     Perception     Praxis      Cognition   Behavior During Therapy: Flat affect Overall Cognitive Status: Within Functional Limits for tasks assessed                       Extremity/Trunk Assessment               Exercises     Shoulder Instructions       General Comments      Pertinent Vitals/ Pain       Pain Assessment: 0-10 Pain Score: 4  Faces Pain Scale: Hurts a little bit Pain Location: back Pain Descriptors / Indicators: Sore Pain Intervention(s): Repositioned  Home Living                                          Prior Functioning/Environment              Frequency Min 2X/week     Progress Toward Goals  OT Goals(current goals can now be found in the care plan section)  Progress towards OT goals: Progressing toward goals  Acute Rehab OT Goals Patient Stated Goal: not stated OT Goal Formulation: With patient Time For Goal Achievement: 04/20/14 Potential to Achieve Goals: Good  ADL Goals Pt Will Perform Grooming: with modified independence;standing Pt Will Transfer to Toilet: with modified independence;ambulating (3 in 1 over commode) Pt Will Perform Toileting - Clothing Manipulation and hygiene: with modified independence;sit to/from stand Pt Will Perform Tub/Shower Transfer: Tub transfer;with supervision;ambulating;tub bench;shower seat;rolling walker;Shower transfer  Plan Discharge plan remains appropriate    Co-evaluation                 End of Session Equipment Utilized During Treatment: Gait belt;Rolling walker;Back brace   Activity Tolerance Patient tolerated treatment well   Patient Left in chair;with call bell/phone within reach   Nurse Communication          Time: 3903-0092 OT Time Calculation (min): 16  min  Charges: OT General Charges $OT Visit: 1 Procedure OT Treatments $Self Care/Home Management : 8-22 mins   Benito Mccreedy OTR/L [phone] 04/14/2014, 12:31 PM

## 2014-04-14 NOTE — Discharge Instructions (Signed)
No lifting no bending no twisting no driving a riding a car less discomfort and forth to see Dr. Vertell Limber

## 2014-04-14 NOTE — Progress Notes (Signed)
Patient ID: Mark Morris, male   DOB: [birth year], 63 y.o.   MRN: [id number] Doing great pain well-controlled  Strength 5 out of 5  Discharge home

## 2014-04-14 NOTE — Discharge Summary (Signed)
Physician Discharge Summary  Patient ID: COLTIN CASHER MRN: [id number] DOB/AGE: 05-08-1951 63 y.o.  Admit date: 04/12/2014 Discharge date: 04/14/2014  Admission Diagnoses:lumbar spondylosis and stenosis L4-5 L5-S1  Discharge Diagnoses: same Active Problems:   Lumbar spondylosis with myelopathy   Discharged Condition: good  Hospital Course: patient with hospital underwent decompression stabilization procedure from L4-S1 postoperatively patient did very well recovered in the floor on the floor he was angling and voiding spontaneous he tolerating regular diet was stable for discharge home posterior day 2.  Consults: Significant Diagnostic Studies: Treatments:L4-5 L5-S1 decompression fusion Discharge Exam: Blood pressure 127/64, pulse 63, temperature 98.4 F (36.9 C), temperature source Oral, resp. rate 20, height 5\' 11"  (1.803 m), weight 105.235 kg (232 lb), SpO2 94.00%. Strength out of 5 wound clean dry and  Disposition: home     Medication List         albuterol 108 (90 BASE) MCG/ACT inhaler  Commonly known as:  PROVENTIL HFA;VENTOLIN HFA  Inhale 2 puffs into the lungs every 6 (six) hours as needed for wheezing or shortness of breath.     ALLEGRA 180 MG tablet  Generic drug:  fexofenadine  Take 180 mg by mouth daily.     ALPRAZolam 0.5 MG tablet  Commonly known as:  XANAX  Take 0.5 mg by mouth 2 (two) times daily as needed for anxiety.     budesonide-formoterol 160-4.5 MCG/ACT inhaler  Commonly known as:  SYMBICORT  Inhale 1 puff into the lungs 2 (two) times daily. Rinse after use     CALCIUM ANTACID ULTRA 400 MG tablet  Generic drug:  calcium elemental as carbonate  Chew 1,000 mg by mouth daily.     cyclobenzaprine 10 MG tablet  Commonly known as:  FLEXERIL  Take 10 mg by mouth 3 (three) times daily as needed for muscle spasms.     doxazosin 2 MG tablet  Commonly known as:  CARDURA  Take 2 mg by mouth daily.     finasteride 5 MG tablet  Commonly known  as:  PROSCAR  Take 5 mg by mouth daily.     FLUoxetine 40 MG capsule  Commonly known as:  PROZAC  Take 40 mg by mouth daily.     HYDROcodone-acetaminophen 5-325 MG per tablet  Commonly known as:  NORCO/VICODIN  Take 1 tablet by mouth every 6 (six) hours as needed for moderate pain.     lisinopril 20 MG tablet  Commonly known as:  PRINIVIL,ZESTRIL  Take 20 mg by mouth daily.     meloxicam 15 MG tablet  Commonly known as:  MOBIC  Take 15 mg by mouth daily.     metFORMIN 500 MG tablet  Commonly known as:  GLUCOPHAGE  Take 500 mg by mouth 2 (two) times daily with a meal.     metoprolol tartrate 25 MG tablet  Commonly known as:  LOPRESSOR  Take 1 tablet (25 mg total) by mouth 2 (two) times daily.     multivitamin tablet  Take 1 tablet by mouth daily.     ONE TOUCH ULTRA TEST test strip  Generic drug:  glucose blood  Use as instructed to test blood sugar once daily.  Diagnosis:  250.00  Non insulin-dependent.     ONETOUCH DELICA LANCETS FINE Misc  Use as instructed to check blood sugar once daily.     oxyCODONE-acetaminophen 5-325 MG per tablet  Commonly known as:  PERCOCET/ROXICET  Take 1-2 tablets by mouth every 4 (four) hours as needed for  moderate pain.     potassium chloride 10 MEQ tablet  Commonly known as:  K-DUR  Take 10 mEq by mouth daily.     rOPINIRole 1 MG tablet  Commonly known as:  REQUIP  Take 1 mg by mouth at bedtime.     simvastatin 20 MG tablet  Commonly known as:  ZOCOR  Take 20 mg by mouth daily.     tiotropium 18 MCG inhalation capsule  Commonly known as:  SPIRIVA HANDIHALER  Place 1 capsule (18 mcg total) into inhaler and inhale daily.     traZODone 50 MG tablet  Commonly known as:  DESYREL  Take 25-50 mg by mouth at bedtime as needed for sleep.           Follow-up Information   Follow up with Peggyann Shoals, MD.   Specialty:  Neurosurgery   Contact information:   [address] [place] [address] [phone]        Signed: Elaina Hoops 04/14/2014, 2:16 PM

## 2014-04-14 NOTE — Progress Notes (Signed)
Physical Therapy Treatment Patient Details Name: Mark Morris MRN: [id number] DOB: 01-06-1951 Today's Date: 04/14/2014    History of Present Illness 63 y.o. s/p Lumbar Four-Five Lumbar Five-Sacral One Redo Laminectomy and Posterior Lumbar Interbody Fusion    PT Comments    Pt progressing well, continuing to work on posture in all positions. Pt ambulated 350' as well as ascending 2 stairs. Currently at min-guard/ supervision level with mobiltiy. PT will continue to follow.   Follow Up Recommendations  Home health PT;Supervision - Intermittent     Equipment Recommendations  None recommended by PT    Recommendations for Other Services       Precautions / Restrictions Precautions Precautions: Back;Fall Precaution Booklet Issued: Yes (comment) Precaution Comments: pt recalled bending and twisting, reviewed arching Required Braces or Orthoses: Spinal Brace Spinal Brace: Lumbar corset;Applied in sitting position Restrictions Weight Bearing Restrictions: No    Mobility  Bed Mobility Overal bed mobility: Needs Assistance Bed Mobility: Sidelying to Sit Rolling: Supervision Sidelying to sit: Supervision       General bed mobility comments: pt able to get to EOB without physical assist but with increased time na dhad difficulty getting out of center of bed. Was able to do so keeping precautions though  Transfers Overall transfer level: Needs assistance Equipment used: Rolling walker (2 wheeled) Transfers: Sit to/from Stand Sit to Stand: Min guard         General transfer comment: cues for safe hand position. First transfer was very difficult for pt, improved with practice and stiffer surface  Ambulation/Gait Ambulation/Gait assistance: Supervision Ambulation Distance (Feet): 350 Feet Assistive device: Rolling walker (2 wheeled) Gait Pattern/deviations: Step-through pattern;Trunk flexed Gait velocity: increased with distance   General Gait Details: vc's for  posture, RW was raised which helped some and pt directed to look at profile in window which also helped increase pt awareness. Decreased step ht and bilateral toe out.    Stairs Stairs: Yes Stairs assistance: Supervision Stair Management: One rail Right;Step to pattern;Forwards Number of Stairs: 2 General stair comments: pt required unilateral UE support, says he will be able to reach door itself at home, no rail. vc's for safe technique   Wheelchair Mobility    Modified Rankin (Stroke Patients Only)       Balance Overall balance assessment: Needs assistance Sitting-balance support: No upper extremity supported;Feet supported Sitting balance-Leahy Scale: Good     Standing balance support: Single extremity supported;During functional activity Standing balance-Leahy Scale: Poor Standing balance comment: requires UE support for safety                    Cognition Arousal/Alertness: Awake/alert Behavior During Therapy: Flat affect Overall Cognitive Status: Within Functional Limits for tasks assessed                      Exercises      General Comments General comments (skin integrity, edema, etc.): discussed proper posture and activity level at home as well as car transfer      Pertinent Vitals/Pain Pain Assessment: Faces Faces Pain Scale: Hurts a little bit Pain Intervention(s): Monitored during session    Home Living                      Prior Function            PT Goals (current goals can now be found in the care plan section) Acute Rehab PT Goals Patient Stated Goal: return home PT  Goal Formulation: With patient Time For Goal Achievement: 04/20/14 Potential to Achieve Goals: Good Progress towards PT goals: Progressing toward goals    Frequency  Min 5X/week    PT Plan Discharge plan needs to be updated    Co-evaluation             End of Session Equipment Utilized During Treatment: Gait belt;Back brace Activity  Tolerance: Patient tolerated treatment well Patient left: in chair;with call bell/phone within reach     Time: 0910-0934 PT Time Calculation (min): 24 min  Charges:  $Gait Training: 23-37 mins                    G Codes:     Leighton Roach, [place]  [phone]  Leighton Roach 04/14/2014, 9:38 AM

## 2014-04-15 ENCOUNTER — Encounter (HOSPITAL_COMMUNITY): Payer: Self-pay | Admitting: Neurosurgery

## 2014-04-15 MED FILL — Heparin Sodium (Porcine) Inj 1000 Unit/ML: INTRAMUSCULAR | Qty: 30 | Status: AC

## 2014-04-15 MED FILL — Sodium Chloride IV Soln 0.9%: INTRAVENOUS | Qty: 2000 | Status: AC

## 2014-05-21 ENCOUNTER — Other Ambulatory Visit: Payer: Self-pay | Admitting: Family Medicine

## 2014-06-03 ENCOUNTER — Other Ambulatory Visit: Payer: Self-pay | Admitting: Family Medicine

## 2014-06-04 NOTE — Telephone Encounter (Signed)
Received refill request electronically from pharmacy. Last office visit 01/01/14. Is it okay to refill medication?

## 2014-06-05 ENCOUNTER — Telehealth: Payer: Self-pay | Admitting: Family Medicine

## 2014-06-05 NOTE — Telephone Encounter (Signed)
Sent!

## 2014-06-06 NOTE — Telephone Encounter (Signed)
Received refill request electronically from pharmacy. Last refill 04/23/14, last office visit 01/01/14. Is it okay to refill medication?

## 2014-06-07 ENCOUNTER — Other Ambulatory Visit: Payer: Self-pay | Admitting: Family Medicine

## 2014-06-07 MED ORDER — ALPRAZOLAM 0.5 MG PO TABS: ORAL_TABLET | ORAL | Status: DC

## 2014-06-07 NOTE — Telephone Encounter (Signed)
Rx printed and will be faxed to requested pharmacy before end of day today.

## 2014-06-07 NOTE — Telephone Encounter (Signed)
Please call in

## 2014-06-10 NOTE — Telephone Encounter (Signed)
Pt left v/m; pt requesting status of alprazolam refill that was supposed to be done last week. Pt said [facility] [address] does not have refill. Pt request cb.

## 2014-06-10 NOTE — Telephone Encounter (Signed)
Attempted to contact pt; line busy. Rx was faxed on 10/16 and a copy was still available of original Rx; refaxed to requested pharmacy

## 2014-06-12 ENCOUNTER — Ambulatory Visit (INDEPENDENT_AMBULATORY_CARE_PROVIDER_SITE_OTHER): Payer: Medicare Other

## 2014-06-12 DIAGNOSIS — Z23 Encounter for immunization: Secondary | ICD-10-CM

## 2014-07-04 ENCOUNTER — Telehealth: Payer: Self-pay | Admitting: Family Medicine

## 2014-07-04 ENCOUNTER — Ambulatory Visit (INDEPENDENT_AMBULATORY_CARE_PROVIDER_SITE_OTHER): Payer: Medicare Other | Admitting: Family Medicine

## 2014-07-04 ENCOUNTER — Encounter: Payer: Self-pay | Admitting: Family Medicine

## 2014-07-04 VITALS — BP 156/84 | HR 62 | Temp 98.2°F | Ht 71.0 in | Wt 211.8 lb

## 2014-07-04 DIAGNOSIS — E119 Type 2 diabetes mellitus without complications: Secondary | ICD-10-CM

## 2014-07-04 DIAGNOSIS — J449 Chronic obstructive pulmonary disease, unspecified: Secondary | ICD-10-CM

## 2014-07-04 DIAGNOSIS — Z7189 Other specified counseling: Secondary | ICD-10-CM

## 2014-07-04 DIAGNOSIS — E785 Hyperlipidemia, unspecified: Secondary | ICD-10-CM

## 2014-07-04 DIAGNOSIS — I10 Essential (primary) hypertension: Secondary | ICD-10-CM

## 2014-07-04 DIAGNOSIS — M545 Low back pain: Secondary | ICD-10-CM

## 2014-07-04 DIAGNOSIS — Z0000 Encounter for general adult medical examination without abnormal findings: Secondary | ICD-10-CM

## 2014-07-04 NOTE — Progress Notes (Signed)
Pre visit review using our clinic review tool, if applicable. No additional management support is needed unless otherwise documented below in the visit note.  I have personally reviewed the Medicare Annual Wellness questionnaire and have noted 1. The patient's medical and social history 2. Their use of alcohol, tobacco or illicit drugs 3. Their current medications and supplements 4. The patient's functional ability including ADL's, fall risks, home safety risks and hearing or visual             impairment. 5. Diet and physical activities 6. Evidence for depression or mood disorders  The patients weight, height, BMI have been recorded in the chart and visual acuity is per eye clinic.  I have made referrals, counseling and provided education to the patient based review of the above and I have provided the pt with a written personalized care plan for preventive services.  Provider list updated- see scanned forms.  Routine anticipatory guidance given to patient.  See health maintenance.  Flu 2015 Shingles d/w pt.  PNA 2012 Tetanus 2005 Colonoscopy 2008 Prostate cancer screening and PSA options (with potential risks and benefits of testing vs not testing) were discussed along with recent recs/guidelines.  He declined testing PSA at this point. Advance directive- wife designated if patient were incapacitated.  Cognitive function addressed- see scanned forms- and if abnormal then additional documentation follows.   Diabetes:  Using medications without difficulties:yes Hypoglycemic episodes:no Hyperglycemic episodes:no Feet problems:no Blood Sugars averaging: up to 400 after eating pasta, but his last A1c was 6.2.  I question that result.   eye exam within last year: due.  D/w pt.   Hypertension:    Using medication without problems or lightheadedness: yes Chest pain with exertion:no Edema:no Short of breath:no  Elevated Cholesterol: Using medications without problems:yes Muscle  aches: not from statin, only from his back surgery and he is improved overall Diet compliance: see above, encouraged Dm2 diet Exercise: as tolerated with his back pain  Back pain.  In brace, improving, per surgery.   COPD.  Using inhalers at baseline, doing well, down go <1/2 ppd smoking. Less wheeze with meds.  No ADE on meds.    PMH and SH reviewed  Meds, vitals, and allergies reviewed.   ROS: See HPI.  Otherwise negative.    GEN: nad, alert and oriented HEENT: mucous membranes moist NECK: supple w/o LA CV: rrr. PULM: ctab, no inc wob ABD: soft, +bs EXT: no edema SKIN: no acute rash  Diabetic foot exam: Normal inspection No skin breakdown No calluses  Normal DP pulses Normal sensation to light touch and monofilament Nails normal

## 2014-07-04 NOTE — Telephone Encounter (Signed)
Brandi from [facility] has a question about a med that was prescribed today.  [id number]  She did not say what their question was in the message she left.

## 2014-07-04 NOTE — Patient Instructions (Addendum)
Check with your insurance to see if they will cover the shingles and tetanus shots at the pharmacy. Come back for fasting labs in about 3-4 weeks.  Schedule a lab appointment on the way out.  Take care.

## 2014-07-05 ENCOUNTER — Other Ambulatory Visit: Payer: Self-pay

## 2014-07-05 DIAGNOSIS — Z7189 Other specified counseling: Secondary | ICD-10-CM | POA: Insufficient documentation

## 2014-07-05 MED ORDER — ONETOUCH ULTRA SYSTEM W/DEVICE KIT: PACK | Status: AC

## 2014-07-05 MED ORDER — GLUCOSE BLOOD VI STRP: ORAL_STRIP | Status: DC

## 2014-07-05 MED ORDER — ONETOUCH DELICA LANCETS FINE MISC: Status: AC

## 2014-07-05 NOTE — Telephone Encounter (Signed)
Pt called for status of diabetic meter, strips and lancets. Brandi at [facility] faxed back written rx and requested refills sent electronically. Pt notified requested diabetic supplies sent to [facility]. Dr Damita Dunnings prescription sent for scanning.

## 2014-07-05 NOTE — Assessment & Plan Note (Signed)
Per surgery.  Weaning out of brace currently.

## 2014-07-05 NOTE — Assessment & Plan Note (Signed)
Using inhalers at baseline, doing well, down go <1/2 ppd smoking. Less wheeze with meds. No ADE on meds.  Smoking cessation d/w pt.

## 2014-07-05 NOTE — Assessment & Plan Note (Signed)
Flu 2015 Shingles d/w pt.  PNA 2012 Tetanus 2005 Colonoscopy 2008 Prostate cancer screening and PSA options (with potential risks and benefits of testing vs not testing) were discussed along with recent recs/guidelines.  He declined testing PSA at this point. Advance directive- wife designated if patient were incapacitated.  Cognitive function addressed- see scanned forms- and if abnormal then additional documentation follows.

## 2014-07-05 NOTE — Assessment & Plan Note (Signed)
He'll check his sugar more often, will recheck A1c in a few weeks.  He agrees.  All d/w pt.

## 2014-07-05 NOTE — Assessment & Plan Note (Signed)
No change in meds now.  I want to see his BP when his back pain is better and he is out of his brace.

## 2014-07-05 NOTE — Assessment & Plan Note (Signed)
Continue statin for now, see notes on f/u labs.  He agrees.

## 2014-07-08 NOTE — Telephone Encounter (Signed)
Spoke to patient and was advised that he has already received his meter and supplies and everything was taken care of on Friday.

## 2014-07-08 NOTE — Telephone Encounter (Signed)
Spoke to pharmacy tech and was advised that patient presented with a written script for diabetic meter and supplies. Was advised that the prescription has to have the specific name of the meter along with the directions as to how many time the patient is to check his sugar daily.  The technician also stated that they had left a message for the patient to call them back. The technician stated that the patient needs to call his insurance company and find out what meter they will cover. A new script needs to be sent in with all of this information along with the ICD code E11.9.   Left message on answering machine for patient to call back.

## 2014-07-08 NOTE — Telephone Encounter (Signed)
Pt left v/m requesting cb [phone].

## 2014-07-10 ENCOUNTER — Telehealth: Payer: Self-pay | Admitting: Family Medicine

## 2014-07-10 NOTE — Telephone Encounter (Signed)
emmi emailed °

## 2014-07-23 ENCOUNTER — Other Ambulatory Visit: Payer: Self-pay | Admitting: Family Medicine

## 2014-07-23 NOTE — Telephone Encounter (Signed)
[facility] request refill requip.Please advise.

## 2014-07-24 NOTE — Telephone Encounter (Signed)
Sent. Thanks.   

## 2014-07-25 ENCOUNTER — Telehealth: Payer: Self-pay | Admitting: Radiology

## 2014-07-25 ENCOUNTER — Other Ambulatory Visit (INDEPENDENT_AMBULATORY_CARE_PROVIDER_SITE_OTHER): Payer: Medicare Other

## 2014-07-25 DIAGNOSIS — E876 Hypokalemia: Secondary | ICD-10-CM

## 2014-07-25 DIAGNOSIS — E119 Type 2 diabetes mellitus without complications: Secondary | ICD-10-CM

## 2014-07-25 LAB — COMPREHENSIVE METABOLIC PANEL
ALK PHOS: 137 U/L — AB (ref 39–117)
ALT: 40 U/L (ref 0–53)
AST: 40 U/L — ABNORMAL HIGH (ref 0–37)
Albumin: 3.9 g/dL (ref 3.5–5.2)
BILIRUBIN TOTAL: 2 mg/dL — AB (ref 0.2–1.2)
BUN: 11 mg/dL (ref 6–23)
CO2: 28 mEq/L (ref 19–32)
Calcium: 8.4 mg/dL (ref 8.4–10.5)
Chloride: 104 mEq/L (ref 96–112)
Creatinine, Ser: 0.9 mg/dL (ref 0.4–1.5)
GFR: 91.09 mL/min (ref >60.00)
GLUCOSE: 126 mg/dL — AB (ref 70–99)
Potassium: 2.6 mEq/L — CL (ref 3.5–5.1)
SODIUM: 143 meq/L (ref 135–145)
TOTAL PROTEIN: 6.7 g/dL (ref 6.0–8.3)

## 2014-07-25 LAB — LIPID PANEL
CHOLESTEROL: 118 mg/dL (ref 0–200)
HDL: 25.9 mg/dL — ABNORMAL LOW (ref >39.00)
LDL CALC: 71 mg/dL (ref 0–99)
NonHDL: 92.1
Total CHOL/HDL Ratio: 5
Triglycerides: 104 mg/dL (ref 0.0–149.0)
VLDL: 20.8 mg/dL (ref 0.0–40.0)

## 2014-07-25 LAB — HEMOGLOBIN A1C: HEMOGLOBIN A1C: 6 % (ref 4.6–6.5)

## 2014-07-25 NOTE — Telephone Encounter (Signed)
[facility] called a critical result, K+ 2.6, results  given to Dr Damita Dunnings

## 2014-07-25 NOTE — Telephone Encounter (Signed)
No answer at home number, cell number or wife's cell number.  Left message on patient's voicemail to return call of home and cell number.

## 2014-07-25 NOTE — Telephone Encounter (Signed)
Please call pt.  Have him take 3 potassium tabs a day starting today.  Recheck BMET on Monday. Order is in.   A1c and other labs are okay.  Recheck for DM2 at [facility] in 6 months.  Thanks.

## 2014-07-26 NOTE — Telephone Encounter (Signed)
Dr. Damita Dunnings advised patient over the phone.  Lab appt scheduled.

## 2014-07-29 ENCOUNTER — Other Ambulatory Visit (INDEPENDENT_AMBULATORY_CARE_PROVIDER_SITE_OTHER): Payer: Medicare Other

## 2014-07-29 DIAGNOSIS — E876 Hypokalemia: Secondary | ICD-10-CM

## 2014-07-29 LAB — BASIC METABOLIC PANEL
BUN: 11 mg/dL (ref 6–23)
CALCIUM: 8.6 mg/dL (ref 8.4–10.5)
CO2: 26 meq/L (ref 19–32)
Chloride: 108 mEq/L (ref 96–112)
Creatinine, Ser: 0.9 mg/dL (ref 0.4–1.5)
GFR: 87.7 mL/min (ref >60.00)
GLUCOSE: 106 mg/dL — AB (ref 70–99)
Potassium: 3 mEq/L — ABNORMAL LOW (ref 3.5–5.1)
Sodium: 142 mEq/L (ref 135–145)

## 2014-07-30 ENCOUNTER — Encounter: Payer: Self-pay | Admitting: *Deleted

## 2014-07-30 ENCOUNTER — Other Ambulatory Visit: Payer: Self-pay | Admitting: Family Medicine

## 2014-07-30 DIAGNOSIS — E876 Hypokalemia: Secondary | ICD-10-CM

## 2014-07-30 MED ORDER — POTASSIUM CHLORIDE ER 10 MEQ PO TBCR: 30.0000 meq | EXTENDED_RELEASE_TABLET | Freq: Every day | ORAL | Status: DC

## 2014-07-31 ENCOUNTER — Telehealth: Payer: Self-pay

## 2014-07-31 NOTE — Telephone Encounter (Signed)
Left message at home number for patient to call back. Need to find out if patient requested that this company supply his diabetic supplies.

## 2014-07-31 NOTE — Telephone Encounter (Signed)
Patient returned your call.  Please call patient back at [phone].

## 2014-07-31 NOTE — Telephone Encounter (Signed)
Spoke to patient and was advised that he does not want supplies from the company and to throw the request in the trash. Sent form back to company showing denied per patient.

## 2014-07-31 NOTE — Telephone Encounter (Signed)
[facility] left v/m wanting to know if diabetic supplies request received. Request cb.

## 2014-08-06 ENCOUNTER — Other Ambulatory Visit (INDEPENDENT_AMBULATORY_CARE_PROVIDER_SITE_OTHER): Payer: Medicare Other

## 2014-08-06 DIAGNOSIS — E876 Hypokalemia: Secondary | ICD-10-CM

## 2014-08-06 LAB — POTASSIUM: Potassium: 3.9 mEq/L (ref 3.5–5.1)

## 2014-08-08 ENCOUNTER — Telehealth: Payer: Self-pay

## 2014-08-08 NOTE — Telephone Encounter (Signed)
LVM for pt to call back.    RE: Need to know if pt has had a Diabetic Eye exam this year, if so where?

## 2014-08-14 ENCOUNTER — Telehealth: Payer: Self-pay | Admitting: Family Medicine

## 2014-08-14 NOTE — Telephone Encounter (Signed)
Called patient @ home #. Asked if he has had eye exam in the last 24 months. he states yes @ dr bell in [place] ph # [phone]. Called dr bell left vm  asking for a faxed copy of report from most recent exam.

## 2014-08-26 ENCOUNTER — Other Ambulatory Visit: Payer: Self-pay | Admitting: Family Medicine

## 2014-08-26 NOTE — Telephone Encounter (Signed)
Electronic refill request.  Please advise. 

## 2014-08-26 NOTE — Telephone Encounter (Signed)
Sent. Thanks.   

## 2014-08-27 ENCOUNTER — Encounter: Payer: Self-pay | Admitting: Family Medicine

## 2014-09-04 ENCOUNTER — Other Ambulatory Visit: Payer: Self-pay | Admitting: Family Medicine

## 2014-09-23 DIAGNOSIS — M5417 Radiculopathy, lumbosacral region: Secondary | ICD-10-CM | POA: Diagnosis not present

## 2014-09-23 DIAGNOSIS — M5416 Radiculopathy, lumbar region: Secondary | ICD-10-CM | POA: Diagnosis not present

## 2014-09-23 DIAGNOSIS — M519 Unspecified thoracic, thoracolumbar and lumbosacral intervertebral disc disorder: Secondary | ICD-10-CM | POA: Diagnosis not present

## 2014-09-23 DIAGNOSIS — Z6827 Body mass index (BMI) 27.0-27.9, adult: Secondary | ICD-10-CM | POA: Diagnosis not present

## 2014-09-23 DIAGNOSIS — M4310 Spondylolisthesis, site unspecified: Secondary | ICD-10-CM | POA: Diagnosis not present

## 2014-10-01 ENCOUNTER — Other Ambulatory Visit: Payer: Self-pay | Admitting: Family Medicine

## 2014-10-01 NOTE — Telephone Encounter (Signed)
Electronic refill request. Last Filled:    60 tablet 2 RF 06/07/2014

## 2014-10-02 NOTE — Telephone Encounter (Signed)
Please call in.  Thanks.   

## 2014-10-02 NOTE — Telephone Encounter (Signed)
Rx called to pharmacy as instructed. 

## 2014-11-08 ENCOUNTER — Ambulatory Visit (INDEPENDENT_AMBULATORY_CARE_PROVIDER_SITE_OTHER): Payer: Medicare Other | Admitting: Family Medicine

## 2014-11-08 ENCOUNTER — Encounter: Payer: Self-pay | Admitting: Family Medicine

## 2014-11-08 VITALS — BP 142/76 | HR 62 | Temp 98.0°F | Wt 200.2 lb

## 2014-11-08 DIAGNOSIS — J309 Allergic rhinitis, unspecified: Secondary | ICD-10-CM | POA: Diagnosis not present

## 2014-11-08 MED ORDER — ALBUTEROL SULFATE HFA 108 (90 BASE) MCG/ACT IN AERS: 2.0000 | INHALATION_SPRAY | Freq: Four times a day (QID) | RESPIRATORY_TRACT | Status: DC | PRN

## 2014-11-08 MED ORDER — BUDESONIDE-FORMOTEROL FUMARATE 160-4.5 MCG/ACT IN AERO: 1.0000 | INHALATION_SPRAY | Freq: Two times a day (BID) | RESPIRATORY_TRACT | Status: DC

## 2014-11-08 MED ORDER — FLUTICASONE PROPIONATE 50 MCG/ACT NA SUSP: 2.0000 | Freq: Every day | NASAL | Status: DC

## 2014-11-08 MED ORDER — TIOTROPIUM BROMIDE MONOHYDRATE 18 MCG IN CAPS: 18.0000 ug | ORAL_CAPSULE | Freq: Every day | RESPIRATORY_TRACT | Status: DC

## 2014-11-08 MED ORDER — CETIRIZINE HCL 10 MG PO TABS: 10.0000 mg | ORAL_TABLET | Freq: Every day | ORAL | Status: DC

## 2014-11-08 NOTE — Progress Notes (Signed)
Pre visit review using our clinic review tool, if applicable. No additional management support is needed unless otherwise documented below in the visit note.  Seasonal allergies, worse in the last 3 weeks, with the weather change.  Clear rhinorrhea.  No FCNAVD.  On allegra w/o help.  Not on nasal steroids.  DM2 controlled, usually with cbg ~100.    Meds, vitals, and allergies reviewed.   ROS: See HPI.  Otherwise, noncontributory.  GEN: nad, alert and oriented HEENT: mucous membranes moist, tm w/o erythema, nasal exam w/o erythema, clear discharge noted,  OP with cobblestoning NECK: supple w/o LA CV: rrr.   PULM: ctab, no inc wob EXT: no edema SKIN: no acute rash

## 2014-11-08 NOTE — Patient Instructions (Signed)
Likely allergies.  Try zytrec 10mg  a day.  If not better, then add on flonase.  That spray may increase your sugar a little.  Take care.

## 2014-11-11 DIAGNOSIS — J309 Allergic rhinitis, unspecified: Secondary | ICD-10-CM | POA: Insufficient documentation

## 2014-11-11 NOTE — Assessment & Plan Note (Signed)
Likely, change to zyrtec, then add on flonase if needed.  F/u prn.  Routine cautions given on meds.

## 2014-11-20 ENCOUNTER — Other Ambulatory Visit: Payer: Self-pay | Admitting: Cardiovascular Disease

## 2014-12-31 ENCOUNTER — Other Ambulatory Visit: Payer: Self-pay | Admitting: Family Medicine

## 2014-12-31 MED ORDER — CYCLOBENZAPRINE HCL 10 MG PO TABS: 5.0000 mg | ORAL_TABLET | Freq: Three times a day (TID) | ORAL | Status: DC | PRN

## 2014-12-31 NOTE — Progress Notes (Signed)
[facility] sent message via his wife re: his back pain.  Continues to have muscle tightness, asking for refill on muscle relaxer.  Rx given to his wife at her OV.   Advised to f/u if not better.

## 2015-01-04 ENCOUNTER — Other Ambulatory Visit: Payer: Self-pay | Admitting: Family Medicine

## 2015-01-27 ENCOUNTER — Other Ambulatory Visit: Payer: Self-pay | Admitting: Family Medicine

## 2015-01-27 DIAGNOSIS — E119 Type 2 diabetes mellitus without complications: Secondary | ICD-10-CM

## 2015-02-03 ENCOUNTER — Other Ambulatory Visit (INDEPENDENT_AMBULATORY_CARE_PROVIDER_SITE_OTHER): Payer: Medicare Other

## 2015-02-03 DIAGNOSIS — E119 Type 2 diabetes mellitus without complications: Secondary | ICD-10-CM

## 2015-02-03 LAB — BASIC METABOLIC PANEL
BUN: 20 mg/dL (ref 6–23)
CHLORIDE: 107 meq/L (ref 96–112)
CO2: 26 mEq/L (ref 19–32)
Calcium: 8.5 mg/dL (ref 8.4–10.5)
Creatinine, Ser: 0.86 mg/dL (ref 0.40–1.50)
GFR: 95.83 mL/min (ref >60.00)
GLUCOSE: 143 mg/dL — AB (ref 70–99)
POTASSIUM: 4 meq/L (ref 3.5–5.1)
SODIUM: 138 meq/L (ref 135–145)

## 2015-02-03 LAB — HEMOGLOBIN A1C: Hgb A1c MFr Bld: 5.9 % (ref 4.6–6.5)

## 2015-02-10 ENCOUNTER — Encounter: Payer: Self-pay | Admitting: Family Medicine

## 2015-02-10 ENCOUNTER — Ambulatory Visit (INDEPENDENT_AMBULATORY_CARE_PROVIDER_SITE_OTHER): Payer: Medicare Other | Admitting: Family Medicine

## 2015-02-10 VITALS — BP 122/70 | HR 67 | Temp 97.7°F | Wt 200.8 lb

## 2015-02-10 DIAGNOSIS — J449 Chronic obstructive pulmonary disease, unspecified: Secondary | ICD-10-CM

## 2015-02-10 DIAGNOSIS — E119 Type 2 diabetes mellitus without complications: Secondary | ICD-10-CM | POA: Diagnosis not present

## 2015-02-10 MED ORDER — MELOXICAM 15 MG PO TABS: 15.0000 mg | ORAL_TABLET | Freq: Every day | ORAL | Status: DC

## 2015-02-10 NOTE — Assessment & Plan Note (Signed)
Stop metformin, he should be able to control sugars off meds.  Recheck in a few months.  He agrees.  Should be able to tolerate higher dose of flonase for a short period of time for allergy sx.  He agrees.

## 2015-02-10 NOTE — Progress Notes (Signed)
Pre visit review using our clinic review tool, if applicable. No additional management support is needed unless otherwise documented below in the visit note.  Cutting back on smoking, d/w pt about cessation.  Wife is awaiting hemodialysis start and that has been tough on both of them.  He had recent allergy sx.  Taking flonase and zyrtec, but still with rhinorrhea.  He usually has trouble this time of year.    Diabetes:  Using medications without difficulties:yes Hypoglycemic episodes:occ  Hyperglycemic episodes:no Feet problems:no Blood Sugars averaging: ~110-120 usually in AM eye exam within last year:due, d/w pt.  He has a card to call about an appointment.   a1c <6, dw pt.  Still working on diet.  Intentional weight loss noted.  D/w pt.    K wnl, d/w pt.  Still on replacement.    Meds, vitals, and allergies reviewed.   ROS: See HPI.  Otherwise negative.    GEN: nad, alert and oriented HEENT: mucous membranes moist, nasal exam stuffy.  OP wnl NECK: supple w/o LA CV: rrr. PULM: ctab, no inc wob ABD: soft, +bs EXT: no edema  Diabetic foot exam: Normal inspection No skin breakdown No calluses  Normal DP pulses Normal sensation to light touch and monofilament Nails normal

## 2015-02-10 NOTE — Patient Instructions (Addendum)
Stop metformin. Call about an eye exam when possible.  You can use the flonase twice a day for a few days to try to get some relief.  Recheck in about 6 months with labs before a physical.   If you have high sugars in the meantime, consistently >150, then restart 1 pill of metformin and update me as needed.  Take care.

## 2015-02-10 NOTE — Assessment & Plan Note (Signed)
He is working to stop smoking.  D/w pt. Encouraged.

## 2015-02-17 ENCOUNTER — Other Ambulatory Visit: Payer: Self-pay | Admitting: Family Medicine

## 2015-02-17 NOTE — Telephone Encounter (Signed)
Please call in.  Thanks.   

## 2015-02-17 NOTE — Telephone Encounter (Signed)
Received refill request electronically from pharmacy. Last refill 10/02/14 #60/2 Last office visit 02/10/15 Is it okay to refill medication?

## 2015-02-18 ENCOUNTER — Other Ambulatory Visit: Payer: Self-pay | Admitting: Family Medicine

## 2015-02-18 NOTE — Telephone Encounter (Signed)
Electronic refill request. Last Filled:

## 2015-02-18 NOTE — Telephone Encounter (Signed)
Rx called to pharmacy as instructed. 

## 2015-02-19 ENCOUNTER — Telehealth: Payer: Self-pay | Admitting: Family Medicine

## 2015-02-19 NOTE — Telephone Encounter (Signed)
Patient Name: Mark Morris  DOB: 10-17-1950    Initial Comment Caller states he was taken off his meds and his BS is 249.   Nurse Assessment  Nurse: Thad Ranger RN, Langley Gauss Date/Time (Eastern Time): 02/19/2015 5:12:40 PM  Confirm and document reason for call. If symptomatic, describe symptoms. ---Caller states he was taken off his meds and his BS is 249. States he was taken off of Metformin 8 days ago and was told by the MD if the BG got over 120 to take Metformin x 1 tab. States yesterday the BG was 229 and today the BG is 249. States he has taken the Metformin x 1 tablet both yesterday and today. States the previous dose was Metformin 1 po bid. Does not take other antidiabetic meds.  Has the patient traveled out of the country within the last 30 days? ---Not Applicable  Does the patient require triage? ---Yes  Related visit to physician within the last 2 weeks? ---Yes  Does the PT have any chronic conditions? (i.e. diabetes, asthma, etc.) ---Yes  List chronic conditions. ---NIDDM, HTN, Arrythmia type unknown. Denies use of anticoag.     Guidelines    Guideline Title Affirmed Question Affirmed Notes  Diabetes - High Blood Sugar Caller has NON-URGENT medication question about med that PCP prescribed and triager unable to answer question Had pt recheck BG while on the phone as he had taken the Metformin at 11am this am. States current BG is 148. Will have the pt call his PCP in the am during reg bus hrs to see if the Metformin needs to be increased back to bid. Pt reports drinking water but not a large amt and has eaten 2 hard boiled eggs today and a ham sandwich. Advised to increase water intake to 8-10, 8oz glasses water/day and continue to watch carb intake closely.   Final Disposition User   Call PCP within 24 Hours [place], RN, [employer]

## 2015-02-20 NOTE — Telephone Encounter (Signed)
Would take 1 tab of metformin a day and update me as needed. Thanks.

## 2015-02-20 NOTE — Telephone Encounter (Signed)
Patient called in and states that his BS last evening was 90 before he went to bed and it was 198 this morning.  He's not sure if he should go back on the bid dosing or not.

## 2015-02-20 NOTE — Telephone Encounter (Signed)
Left detailed message on voicemail.  

## 2015-02-20 NOTE — Telephone Encounter (Signed)
Since recheck sugar was okay, would continue with 1 tab of metformin a day and update me as needed. Thanks.

## 2015-02-20 NOTE — Telephone Encounter (Signed)
Patient advised.

## 2015-02-27 ENCOUNTER — Ambulatory Visit (INDEPENDENT_AMBULATORY_CARE_PROVIDER_SITE_OTHER): Payer: Medicare Other | Admitting: Family Medicine

## 2015-02-27 ENCOUNTER — Encounter: Payer: Self-pay | Admitting: Family Medicine

## 2015-02-27 VITALS — BP 126/68 | HR 62 | Temp 97.5°F | Wt 195.2 lb

## 2015-02-27 DIAGNOSIS — J309 Allergic rhinitis, unspecified: Secondary | ICD-10-CM

## 2015-02-27 DIAGNOSIS — E119 Type 2 diabetes mellitus without complications: Secondary | ICD-10-CM | POA: Diagnosis not present

## 2015-02-27 MED ORDER — CYCLOBENZAPRINE HCL 10 MG PO TABS: 5.0000 mg | ORAL_TABLET | Freq: Two times a day (BID) | ORAL | Status: DC | PRN

## 2015-02-27 MED ORDER — GUAIFENESIN ER 600 MG PO TB12: 600.0000 mg | ORAL_TABLET | Freq: Two times a day (BID) | ORAL | Status: AC

## 2015-02-27 MED ORDER — GLUCOSE BLOOD VI STRP: ORAL_STRIP | Status: AC

## 2015-02-27 NOTE — Patient Instructions (Addendum)
Keep going with 2 metformin a day for now.  Update me as needed.  We'll recheck your labs in the fall.   Start back on mucinex and stop the allegra D.  Take care.  Glad to see you.

## 2015-02-27 NOTE — Progress Notes (Signed)
Pre visit review using our clinic review tool, if applicable. No additional management support is needed unless otherwise documented below in the visit note.  Sugar has been "up and down."  From 58-240.  He didn't tolerate cutting back to 1 tab metformin a day.  He is back to 2 tabs a day recently. He can feel the high and low sugar. He feels better on 2 tabs.  Intentional weight loss with diet.   Still with rhinorrhea in spite of flonase.  Changed to allergra D w/o relief.  Isn't on mucinex.  Had used that in the past.   Needs rx on flexeril.  Is taking it BID.    Meds, vitals, and allergies reviewed.   ROS: See HPI.  Otherwise, noncontributory.  GEN: nad, alert and oriented HEENT: mucous membranes moist, clear rhinorrhea noted. OP wnl NECK: supple w/o LA CV: rrr PULM: ctab, no inc wob ABD: soft, +bs EXT: no edema

## 2015-02-28 NOTE — Assessment & Plan Note (Signed)
Restart mucinex, continue flonase, stop allegera D since that wasn't helping.  He agrees.

## 2015-02-28 NOTE — Assessment & Plan Note (Signed)
He did better on bid metformin, worse on once daily, will continue bid dosing.  Update me as needed.  He seems to have less highs and lows with bid dosing.  D/w pt, he agrees.

## 2015-03-05 ENCOUNTER — Ambulatory Visit (INDEPENDENT_AMBULATORY_CARE_PROVIDER_SITE_OTHER): Payer: Medicare Other | Admitting: Cardiovascular Disease

## 2015-03-05 ENCOUNTER — Encounter: Payer: Self-pay | Admitting: Cardiovascular Disease

## 2015-03-05 VITALS — BP 118/74 | HR 63 | Ht 71.0 in | Wt 195.0 lb

## 2015-03-05 DIAGNOSIS — I10 Essential (primary) hypertension: Secondary | ICD-10-CM | POA: Diagnosis not present

## 2015-03-05 DIAGNOSIS — I2510 Atherosclerotic heart disease of native coronary artery without angina pectoris: Secondary | ICD-10-CM

## 2015-03-05 DIAGNOSIS — E785 Hyperlipidemia, unspecified: Secondary | ICD-10-CM

## 2015-03-05 DIAGNOSIS — I471 Supraventricular tachycardia: Secondary | ICD-10-CM | POA: Diagnosis not present

## 2015-03-05 DIAGNOSIS — E119 Type 2 diabetes mellitus without complications: Secondary | ICD-10-CM | POA: Diagnosis not present

## 2015-03-05 NOTE — Patient Instructions (Signed)
You are doing well. No medication changes were made.  Please call us if you have new issues that need to be addressed before your next appt.  Your physician wants you to follow-up in: 12 months.  You will receive a reminder letter in the mail two months in advance. If you don't receive a letter, please call our office to schedule the follow-up appointment. 

## 2015-03-05 NOTE — Assessment & Plan Note (Signed)
Blood pressure is well controlled on today's visit. No changes made to the medications. 

## 2015-03-05 NOTE — Assessment & Plan Note (Signed)
Currently with no symptoms of angina. No further workup at this time. Continue current medication regimen. 

## 2015-03-05 NOTE — Progress Notes (Signed)
Patient ID: Mark Morris, male    DOB: 1951/01/01, 64 y.o.   MRN: [id number]  HPI Comments: Mark Morris is a 64 year-old gentleman with history of mild CAD in 2004, Myoview in 2005 showing no significant ischemia, chronic fatigue and poor energy for the past several years, history of SVT requiring adenosine,  cardiac catheterization in 02/2014 that showed mild proximal LAD disease, otherwise mild luminal irregularities. Mild to moderate global hypokinesis. Ejection fraction 35-45%.  He has chronic back pain lower as well as upper. He is a smoker He presents for follow-up of his coronary artery disease and cardiomyopathy  In follow-up, he reports doing well. He continues to smoke one pack per day. Significant problems with allergies Hemoglobin A1c 5.9, total cholesterol 118, LDL 71 Continues on simvastatin 20 mg daily  EKG shows normal sinus rhythm with rate 63 bpm, nonspecific QRS widening, left anterior fascicular block  Other past medical history Prior history of chronic back pain  Recent stress test showing ischemia in the inferior wall. Ejection fraction 40%. He had cardiac catheterization on 03/01/2014. This showed mild proximal LAD disease, otherwise mild luminal irregularities. Mild to moderate global hypokinesis. Ejection fraction 35-45%. Significant ectopy noted, APCs. Etiology of his depressed ejection fraction is unclear. He denies alcohol. Unable to exclude sleep apnea  History of carpal tunnel surgery on the left   lab work shows normal ttal testosterone, low free testosterone   Allergies  Allergen Reactions  . Codeine     nausea  . Sulfonamide Derivatives     nausea    Current Outpatient Prescriptions on File Prior to Visit  Medication Sig Dispense Refill  . albuterol (PROVENTIL HFA;VENTOLIN HFA) 108 (90 BASE) MCG/ACT inhaler Inhale 2 puffs into the lungs every 6 (six) hours as needed for wheezing or shortness of breath. 18 g 12  . ALPRAZolam (XANAX) 0.5 MG tablet  TAKE ONE TABLET BY MOUTH TWICE DAILY AS NEEDED 60 tablet 2  . Blood Glucose Monitoring Suppl (ONE TOUCH ULTRA SYSTEM KIT) W/DEVICE KIT Check blood sugar once daily and as directed. Ex E11.9 1 each 0  . budesonide-formoterol (SYMBICORT) 160-4.5 MCG/ACT inhaler Inhale 1 puff into the lungs 2 (two) times daily. Rinse after use 1 Inhaler 12  . calcium elemental as carbonate (CALCIUM ANTACID ULTRA) 400 MG tablet Chew 1,000 mg by mouth daily.      . cyclobenzaprine (FLEXERIL) 10 MG tablet Take 0.5-1 tablets (5-10 mg total) by mouth 2 (two) times daily as needed for muscle spasms (sedation caution). 60 tablet 3  . doxazosin (CARDURA) 2 MG tablet Take 2 mg by mouth daily.    . finasteride (PROSCAR) 5 MG tablet Take 5 mg by mouth daily.    Marland Kitchen FLUoxetine (PROZAC) 40 MG capsule Take 40 mg by mouth daily.    . fluticasone (FLONASE) 50 MCG/ACT nasal spray Place 2 sprays into both nostrils daily. 16 g 12  . glucose blood (ONE TOUCH ULTRA TEST) test strip Use as instructed to test blood sugar 1-2 times daily.  Diagnosis:E11.9.  History of high and low sugars, with need for mult testing per day 200 each 3  . guaiFENesin (MUCINEX) 600 MG 12 hr tablet Take 1 tablet (600 mg total) by mouth 2 (two) times daily.    Marland Kitchen lisinopril (PRINIVIL,ZESTRIL) 20 MG tablet Take 20 mg by mouth daily.    . meloxicam (MOBIC) 15 MG tablet Take 1 tablet (15 mg total) by mouth daily. With food 90 tablet 1  . metFORMIN (GLUCOPHAGE)  500 MG tablet Take 500 mg by mouth 2 (two) times daily with a meal.    . metoprolol tartrate (LOPRESSOR) 25 MG tablet TAKE ONE TABLET BY MOUTH TWICE DAILY 180 tablet 3  . Multiple Vitamin (MULTIVITAMIN) tablet Take 1 tablet by mouth daily.      Glory Rosebush DELICA LANCETS FINE MISC check blood sugar once daily and as directed.E11.9 100 each 3  . potassium chloride (K-DUR) 10 MEQ tablet Take 3 tablets (30 mEq total) by mouth daily. 270 tablet 3  . rOPINIRole (REQUIP) 1 MG tablet TAKE ONE TABLET BY MOUTH AT BEDTIME  90 tablet 3  . simvastatin (ZOCOR) 20 MG tablet TAKE ONE TABLET BY MOUTH ONCE DAILY *NEEDS TO SCHEDULE ANNUAL PHYSICAL AND LAB WORK FOR FURTHER REFILLS* 90 tablet 3  . tiotropium (SPIRIVA HANDIHALER) 18 MCG inhalation capsule Place 1 capsule (18 mcg total) into inhaler and inhale daily. 30 capsule 12  . traZODone (DESYREL) 50 MG tablet TAKE ONE-HALF TO ONE TABLET BY MOUTH AT BEDTIME AS NEEDED FOR SLEEP 90 tablet 3   No current facility-administered medications on file prior to visit.    Past Medical History  Diagnosis Date  . Hyperlipidemia 08/23/1997  . Allergy 2002    allergic rhinitis  . Hypertension 1980  . Anxiety 08/24/1999  . Depression 08/24/1999  . Diabetes mellitus 08/24/2003    typeII  . Enteritis 10/18/2004    CT abd. Enteritis//CT pelvis Sacroiliac Spurring  . Sigmoid polyp 07/03/2007    colonoscopy Small internal hemorrhoids; Sigmoid polyp B9 (Dr. Elliott)//colonoscopy normal 04/06/2004  . CAD (coronary artery disease)     Nonobstructive by cardiac catheterization 5/04: Proximal RCA 25%, EF 65%;  Myoview 12/05: no scar or ischemia, EF 58%  . BPH (benign prostatic hyperplasia)   . Paroxysmal SVT (supraventricular tachycardia)     History of  . Tobacco abuse   . COPD (chronic obstructive pulmonary disease)   . Sleep apnea     does not use C-pap ; study done >10 years ago  . Shortness of breath     exertional  . Arthritis   . DDD (degenerative disc disease), lumbosacral   . HNP (herniated nucleus pulposus), lumbar   . Dysrhythmia 2013    h/o SVT   . Post-operative infection 2006    after gastric bypass for weight loss  . Hearing difficulty of both ears     Past Surgical History  Procedure Laterality Date  . Gastric fundoplication  9357    [facility]  . Hernia repair  01/02/2006    right ventral Hernia Repair Laparascopic [place] [facility]  . Gastric bypass  11/24/2004  . Sleep study  03/2004    Sleep Study Hypopneas 60/hr ; Kicks 160/hr  . Ankle fracture  surgery  02/2003  . Carpal tunnel release Bilateral   . Cholecystectomy    . Appendectomy    . Colonoscopy    . Cardiac catheterization  01/14/2003    Min plaque EF65%// Cath normal (Dr. Clayborn Bigness 2000)//ECHO Mild AAA TrMR Ef61% 05/30/2002 (Dr.Callwood)  . Cardiac catheterization  2015    [facility]  . Back surgery  2010  . Knee arthroscopy w/ meniscal repair Right   . Maximum access (mas)posterior lumbar interbody fusion (plif) 2 level N/A 04/12/2014    Procedure: Lumbar Four-Five Lumbar Five-Sacral One Redo Laminectomy and Posterior Lumbar Interbody Fusion;  Surgeon: Erline Levine, MD;  Location: [place] [facility];  Service: Neurosurgery;  Laterality: N/A;  Lumbar Four-Five Lumbar Five-Sacral One Redo Laminectomy and Posterior Lumbar  Interbody Fusion    Social History  reports that he has been smoking Cigarettes.  He has a 40 pack-year smoking history. He has never used smokeless tobacco. He reports that he does not drink alcohol or use illicit drugs.  Family History family history includes Alcohol abuse in his brother; Alcohol abuse (age of onset: 72) in his brother; Diabetes in his brother, brother, and mother; Heart disease (age of onset: 52) in his mother; Heart disease (age of onset: 18) in his father; Hypertension in his father. There is no history of Colon cancer or Prostate cancer.   Review of Systems  Constitutional: Negative.   Respiratory: Negative.   Cardiovascular: Negative.   Gastrointestinal: Negative.   Musculoskeletal: Positive for back pain.  Skin: Negative.   Neurological: Negative.   Hematological: Negative.   Psychiatric/Behavioral: Negative.   All other systems reviewed and are negative.   BP 118/74 mmHg  Pulse 63  Ht _0  (1.803 m)  Wt 195 lb (88.451 kg)  BMI 27.21 kg/m2  Physical Exam  Constitutional: He is oriented to person, place, and time. He appears well-developed and well-nourished.  Obese  HENT:  Head: Normocephalic.  Nose: Nose normal.   Mouth/Throat: Oropharynx is clear and moist.  Eyes: Conjunctivae are normal. Pupils are equal, round, and reactive to light.  Neck: Normal range of motion. Neck supple. No JVD present.  Cardiovascular: Normal rate, regular rhythm, S1 normal, S2 normal, normal heart sounds and intact distal pulses.  Exam reveals no gallop and no friction rub.   No murmur heard. Pulmonary/Chest: Effort normal and breath sounds normal. No respiratory distress. He has no wheezes. He has no rales. He exhibits no tenderness.  Abdominal: Soft. Bowel sounds are normal. He exhibits no distension. There is no tenderness.  Musculoskeletal: Normal range of motion. He exhibits no edema or tenderness.  Lymphadenopathy:    He has no cervical adenopathy.  Neurological: He is alert and oriented to person, place, and time. Coordination normal.  Skin: Skin is warm and dry. No rash noted. No erythema.  Psychiatric: He has a normal mood and affect. His behavior is normal. Judgment and thought content normal.      Assessment and Plan   Nursing note and vitals reviewed.

## 2015-03-05 NOTE — Assessment & Plan Note (Signed)
Hemoglobin A1c well controlled.

## 2015-03-05 NOTE — Assessment & Plan Note (Signed)
No significant arrhythmia. Overall doing well. No further workup or medication changes at this time

## 2015-03-05 NOTE — Assessment & Plan Note (Signed)
Cholesterol is at goal on the current lipid regimen. No changes to the medications were made.  

## 2015-03-17 ENCOUNTER — Other Ambulatory Visit: Payer: Self-pay | Admitting: Family Medicine

## 2015-04-25 ENCOUNTER — Other Ambulatory Visit: Payer: Self-pay | Admitting: Cardiovascular Disease

## 2015-04-25 ENCOUNTER — Other Ambulatory Visit: Payer: Self-pay | Admitting: Family Medicine

## 2015-04-28 ENCOUNTER — Other Ambulatory Visit: Payer: Self-pay | Admitting: Family Medicine

## 2015-06-05 ENCOUNTER — Other Ambulatory Visit: Payer: Self-pay | Admitting: Family Medicine

## 2015-06-05 NOTE — Telephone Encounter (Signed)
Please call in.  Thanks.   

## 2015-06-05 NOTE — Telephone Encounter (Signed)
Electronic refill request, last OV was on 02/27/15, last refilled on 02/17/15 #60 with 2 additional refills, please advise

## 2015-06-06 NOTE — Telephone Encounter (Signed)
Rx called in to requested pharmacy 

## 2015-06-06 NOTE — Telephone Encounter (Signed)
Pharmacy line busy.

## 2015-07-08 ENCOUNTER — Ambulatory Visit (INDEPENDENT_AMBULATORY_CARE_PROVIDER_SITE_OTHER): Payer: Medicare Other | Admitting: Internal Medicine

## 2015-07-08 ENCOUNTER — Encounter: Payer: Self-pay | Admitting: Internal Medicine

## 2015-07-08 VITALS — BP 140/90 | HR 88 | Temp 97.7°F | Wt 209.0 lb

## 2015-07-08 DIAGNOSIS — Z23 Encounter for immunization: Secondary | ICD-10-CM

## 2015-07-08 DIAGNOSIS — M778 Other enthesopathies, not elsewhere classified: Secondary | ICD-10-CM | POA: Diagnosis not present

## 2015-07-08 MED ORDER — METHYLPREDNISOLONE ACETATE 80 MG/ML IJ SUSP
80.0000 mg | Freq: Once | INTRAMUSCULAR | Status: AC
  Administered 2015-07-08: 80 mg via INTRAMUSCULAR

## 2015-07-08 NOTE — Progress Notes (Signed)
Pre visit review using our clinic review tool, if applicable. No additional management support is needed unless otherwise documented below in the visit note. 

## 2015-07-08 NOTE — Progress Notes (Signed)
Subjective:    Patient ID: Mark Morris, male    DOB: 21-Nov-1950, 64 y.o.   MRN: [id number]  HPI  Pt presents to the clinic today with c/o right wrist pain. He reports this started 3 weeks ago. He denies any injury to the area.  The pain is on the right side of his wrist. He describes the pain as sharp and stabbing. The pain is intermittent. He denies numbness and tingling. He did have a carpal tunnel release on that side 4 years ago. He has tried wearing a brace without relief. He has not tried anything OTC.  Review of Systems      Past Medical History  Diagnosis Date  . Hyperlipidemia 08/23/1997  . Allergy 2002    allergic rhinitis  . Hypertension 1980  . Anxiety 08/24/1999  . Depression 08/24/1999  . Diabetes mellitus 08/24/2003    typeII  . Enteritis 10/18/2004    CT abd. Enteritis//CT pelvis Sacroiliac Spurring  . Sigmoid polyp 07/03/2007    colonoscopy Small internal hemorrhoids; Sigmoid polyp B9 (Dr. Elliott)//colonoscopy normal 04/06/2004  . CAD (coronary artery disease)     Nonobstructive by cardiac catheterization 5/04: Proximal RCA 25%, EF 65%;  Myoview 12/05: no scar or ischemia, EF 58%  . BPH (benign prostatic hyperplasia)   . Paroxysmal SVT (supraventricular tachycardia) (HCC)     History of  . Tobacco abuse   . COPD (chronic obstructive pulmonary disease) ([place])   . Sleep apnea     does not use C-pap ; study done >10 years ago  . Shortness of breath     exertional  . Arthritis   . DDD (degenerative disc disease), lumbosacral   . HNP (herniated nucleus pulposus), lumbar   . Dysrhythmia 2013    h/o SVT   . Post-operative infection 2006    after gastric bypass for weight loss  . Hearing difficulty of both ears     Current Outpatient Prescriptions  Medication Sig Dispense Refill  . albuterol (PROVENTIL HFA;VENTOLIN HFA) 108 (90 BASE) MCG/ACT inhaler Inhale 2 puffs into the lungs every 6 (six) hours as needed for wheezing or shortness of breath. 18 g 12    . ALPRAZolam (XANAX) 0.5 MG tablet TAKE ONE TABLET BY MOUTH TWICE DAILY AS NEEDED 60 tablet 2  . Blood Glucose Monitoring Suppl (ONE TOUCH ULTRA SYSTEM KIT) W/DEVICE KIT Check blood sugar once daily and as directed. Ex E11.9 1 each 0  . budesonide-formoterol (SYMBICORT) 160-4.5 MCG/ACT inhaler Inhale 1 puff into the lungs 2 (two) times daily. Rinse after use 1 Inhaler 12  . calcium elemental as carbonate (CALCIUM ANTACID ULTRA) 400 MG tablet Chew 1,000 mg by mouth daily.      . cyclobenzaprine (FLEXERIL) 10 MG tablet Take 0.5-1 tablets (5-10 mg total) by mouth 2 (two) times daily as needed for muscle spasms (sedation caution). 60 tablet 3  . doxazosin (CARDURA) 2 MG tablet Take 2 mg by mouth daily.    Marland Kitchen doxazosin (CARDURA) 2 MG tablet TAKE ONE TABLET BY MOUTH ONCE DAILY 90 tablet 0  . finasteride (PROSCAR) 5 MG tablet TAKE ONE TABLET BY MOUTH ONCE DAILY 90 tablet 1  . FLUoxetine (PROZAC) 40 MG capsule Take 40 mg by mouth daily.    Marland Kitchen FLUoxetine (PROZAC) 40 MG capsule TAKE ONE CAPSULE BY MOUTH ONCE DAILY 90 capsule 0  . fluticasone (FLONASE) 50 MCG/ACT nasal spray Place 2 sprays into both nostrils daily. 16 g 12  . glucose blood (ONE TOUCH ULTRA TEST)  test strip Use as instructed to test blood sugar 1-2 times daily.  Diagnosis:E11.9.  History of high and low sugars, with need for mult testing per day 200 each 3  . guaiFENesin (MUCINEX) 600 MG 12 hr tablet Take 1 tablet (600 mg total) by mouth 2 (two) times daily.    Marland Kitchen lisinopril (PRINIVIL,ZESTRIL) 20 MG tablet Take 20 mg by mouth daily.    Marland Kitchen lisinopril (PRINIVIL,ZESTRIL) 20 MG tablet TAKE ONE TABLET BY MOUTH ONCE DAILY 90 tablet 3  . meloxicam (MOBIC) 15 MG tablet Take 1 tablet (15 mg total) by mouth daily. With food 90 tablet 1  . metFORMIN (GLUCOPHAGE) 500 MG tablet Take 500 mg by mouth 2 (two) times daily with a meal.    . metFORMIN (GLUCOPHAGE) 500 MG tablet TAKE ONE TABLET BY MOUTH TWICE DAILY 180 tablet 0  . metoprolol tartrate (LOPRESSOR) 25  MG tablet TAKE ONE TABLET BY MOUTH TWICE DAILY 180 tablet 3  . Multiple Vitamin (MULTIVITAMIN) tablet Take 1 tablet by mouth daily.      Glory Rosebush DELICA LANCETS FINE MISC check blood sugar once daily and as directed.E11.9 100 each 3  . potassium chloride (K-DUR) 10 MEQ tablet Take 3 tablets (30 mEq total) by mouth daily. 270 tablet 3  . rOPINIRole (REQUIP) 1 MG tablet TAKE ONE TABLET BY MOUTH AT BEDTIME 90 tablet 3  . simvastatin (ZOCOR) 20 MG tablet TAKE ONE TABLET BY MOUTH ONCE DAILY *NEEDS TO SCHEDULE ANNUAL PHYSICAL AND LAB WORK FOR FURTHER REFILLS* 90 tablet 3  . tiotropium (SPIRIVA HANDIHALER) 18 MCG inhalation capsule Place 1 capsule (18 mcg total) into inhaler and inhale daily. 30 capsule 12  . traZODone (DESYREL) 50 MG tablet TAKE ONE-HALF TO ONE TABLET BY MOUTH AT BEDTIME AS NEEDED FOR SLEEP 90 tablet 3   No current facility-administered medications for this visit.    Allergies  Allergen Reactions  . Codeine     nausea  . Sulfonamide Derivatives     nausea    Family History  Problem Relation Age of Onset  . Heart disease Mother 34    CABG DM; Died due to Staph infection of sterotomy incision  . Diabetes Mother   . Hypertension Father   . Heart disease Father 57    pneumonia/CHF  . Alcohol abuse Brother 51    Has stopped at present  . Diabetes Brother   . Alcohol abuse Brother   . Diabetes Brother   . Colon cancer Neg Hx   . Prostate cancer Neg Hx     Social History   Social History  . Marital Status: Married    Spouse Name: N/A  . Number of Children: N/A  . Years of Education: N/A   Occupational History  . Not on file.   Social History Main Topics  . Smoking status: Current Every Day Smoker -- 1.00 packs/day for 40 years    Types: Cigarettes  . Smokeless tobacco: Never Used     Comment: cutting down as of 06/2014, down to 1/2 PPD or less.   . Alcohol Use: No  . Drug Use: No  . Sexual Activity: Yes     Comment: Rarely   Other Topics Concern  . 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  Respiratory:  Denies difficulty breathing, shortness of breath, cough or sputum production.   Cardiovascular: Denies chest pain, chest tightness, palpitations or swelling in the hands or feet.  Musculoskeletal: Pt reports right wrist pain. Denies decrease in range of motion, difficulty with gait, muscle pain.  Skin: Denies redness, rashes, lesions or ulcercations.  Neurological: Denies dizziness, difficulty with memory, difficulty with speech or problems with balance and coordination.    No other specific complaints in a complete review of systems (except as listed in HPI above).  Objective:   Physical Exam   BP 140/90 mmHg  Pulse 88  Temp(Src) 97.7 F (36.5 C) (Oral)  Wt 209 lb (94.802 kg)  SpO2 97% Wt Readings from Last 3 Encounters:  07/08/15 209 lb (94.802 kg)  03/05/15 195 lb (88.451 kg)  02/27/15 195 lb 4 oz (88.565 kg)    General: Appears his stated age, well developed, well nourished in NAD. Cardiovascular: Normal rate and rhythm. S1,S2 noted.  No murmur, rubs or gallops noted.  Pulmonary/Chest: Normal effort and positive vesicular breath sounds. No respiratory distress. No wheezes, rales or ronchi noted.  Musculoskeletal:  Normal flexion and extension. Pain with rotation of the right wrist. Pain with resistance to flexion and extension of the right thumb. Hand grips equal.  Neurological: Alert and oriented. Sensation intact to BUE.   BMET    Component Value Date/Time   NA 138 02/03/2015 0826   NA 139 02/14/2014 0922   K 4.0 02/03/2015 0826   CL 107 02/03/2015 0826   CO2 26 02/03/2015 0826   GLUCOSE 143* 02/03/2015 0826   GLUCOSE 128* 02/14/2014 0922   BUN 20 02/03/2015 0826   BUN 19 02/14/2014 0922   CREATININE 0.86 02/03/2015  0826   CALCIUM 8.5 02/03/2015 0826   GFRNONAA 72* 03/29/2014 1441   GFRAA 83* 03/29/2014 1441    Lipid Panel     Component Value Date/Time   CHOL 118 07/25/2014 0857   TRIG 104.0 07/25/2014 0857   HDL 25.90* 07/25/2014 0857   CHOLHDL 5 07/25/2014 0857   VLDL 20.8 07/25/2014 0857   LDLCALC 71 07/25/2014 0857    CBC    Component Value Date/Time   WBC 9.3 03/29/2014 1441   WBC 9.2 02/14/2014 0922   RBC 4.81 03/29/2014 1441   RBC 4.97 02/14/2014 0922   HGB 11.2* 03/29/2014 1441   HCT 37.8* 03/29/2014 1441   PLT 282 03/29/2014 1441   MCV 78.6 03/29/2014 1441   MCH 23.3* 03/29/2014 1441   MCH 24.1* 02/14/2014 0922   MCHC 29.6* 03/29/2014 1441   MCHC 31.7 02/14/2014 0922   RDW 16.2* 03/29/2014 1441   RDW 17.2* 02/14/2014 0922   LYMPHSABS 2.1 02/14/2014 0922   LYMPHSABS 2.6 02/09/2011 1240   MONOABS 0.5 02/09/2011 1240   EOSABS 0.1 02/14/2014 0922   EOSABS 0.2 02/09/2011 1240   BASOSABS 0.1 02/14/2014 0922   BASOSABS 0.1 02/09/2011 1240    Hgb A1C Lab Results  Component Value Date   HGBA1C 5.9 02/03/2015        Assessment & Plan:   Tendonitis of right wrist/thumb:  80 mg Depo IM today Ibuprofen 600 mg BID with meals x 3 days  If pain persist, consider prednisone Ice may be helpful  RTC as needed or if symptoms persist or worsen

## 2015-07-08 NOTE — Addendum Note (Signed)
Addended by: Lurlean Nanny on: 07/08/2015 05:04 PM   Modules accepted: Orders

## 2015-07-08 NOTE — Patient Instructions (Signed)
Tendinitis °Tendinitis is swelling and inflammation of the tendons. Tendons are band-like tissues that connect muscle to bone. Tendinitis commonly occurs in the:  °· Shoulders (rotator cuff). °· Heels (Achilles tendon). °· Elbows (triceps tendon). °CAUSES °Tendinitis is usually caused by overusing the tendon, muscles, and joints involved. When the tissue surrounding a tendon (synovium) becomes inflamed, it is called tenosynovitis. Tendinitis commonly develops in people whose jobs require repetitive motions. °SYMPTOMS °· Pain. °· Tenderness. °· Mild swelling. °DIAGNOSIS °Tendinitis is usually diagnosed by physical exam. Your health care provider may also order X-rays or other imaging tests. °TREATMENT °Your health care provider may recommend certain medicines or exercises for your treatment. °HOME CARE INSTRUCTIONS  °· Use a sling or splint for as long as directed by your health care provider until the pain decreases. °· Put ice on the injured area. °¨ Put ice in a plastic bag. °¨ Place a towel between your skin and the bag. °¨ Leave the ice on for 15-20 minutes, 3-4 times a day, or as directed by your health care provider. °· Avoid using the limb while the tendon is painful. Perform gentle range of motion exercises only as directed by your health care provider. Stop exercises if pain or discomfort increase, unless directed otherwise by your health care provider. °· Only take over-the-counter or prescription medicines for pain, discomfort, or fever as directed by your health care provider. °SEEK MEDICAL CARE IF:  °· Your pain and swelling increase. °· You develop new, unexplained symptoms, especially increased numbness in the hands. °MAKE SURE YOU:  °· Understand these instructions. °· Will watch your condition. °· Will get help right away if you are not doing well or get worse. °  °This information is not intended to replace advice given to you by your health care provider. Make sure you discuss any questions you  have with your health care provider. °  °Document Released: 08/06/2000 Document Revised: 08/30/2014 Document Reviewed: 10/26/2010 °Elsevier Interactive Patient Education ©2016 Elsevier Inc. ° °

## 2015-08-10 ENCOUNTER — Other Ambulatory Visit: Payer: Self-pay | Admitting: Family Medicine

## 2015-08-10 DIAGNOSIS — E119 Type 2 diabetes mellitus without complications: Secondary | ICD-10-CM

## 2015-08-13 ENCOUNTER — Other Ambulatory Visit (INDEPENDENT_AMBULATORY_CARE_PROVIDER_SITE_OTHER): Payer: Medicare Other

## 2015-08-13 DIAGNOSIS — E119 Type 2 diabetes mellitus without complications: Secondary | ICD-10-CM

## 2015-08-13 LAB — COMPREHENSIVE METABOLIC PANEL
ALBUMIN: 4.1 g/dL (ref 3.5–5.2)
ALK PHOS: 118 U/L — AB (ref 39–117)
ALT: 25 U/L (ref 0–53)
AST: 27 U/L (ref 0–37)
BILIRUBIN TOTAL: 1 mg/dL (ref 0.2–1.2)
BUN: 19 mg/dL (ref 6–23)
CO2: 29 mEq/L (ref 19–32)
Calcium: 9.1 mg/dL (ref 8.4–10.5)
Chloride: 103 mEq/L (ref 96–112)
Creatinine, Ser: 0.91 mg/dL (ref 0.40–1.50)
GFR: 89.62 mL/min (ref >60.00)
Glucose, Bld: 131 mg/dL — ABNORMAL HIGH (ref 70–99)
POTASSIUM: 4.6 meq/L (ref 3.5–5.1)
Sodium: 139 mEq/L (ref 135–145)
TOTAL PROTEIN: 6.7 g/dL (ref 6.0–8.3)

## 2015-08-13 LAB — LIPID PANEL
CHOLESTEROL: 181 mg/dL (ref 0–200)
HDL: 52.5 mg/dL (ref >39.00)
LDL Cholesterol: 107 mg/dL — ABNORMAL HIGH (ref 0–99)
NonHDL: 128.48
Total CHOL/HDL Ratio: 3
Triglycerides: 108 mg/dL (ref 0.0–149.0)
VLDL: 21.6 mg/dL (ref 0.0–40.0)

## 2015-08-13 LAB — HEMOGLOBIN A1C: HEMOGLOBIN A1C: 6.1 % (ref 4.6–6.5)

## 2015-08-21 ENCOUNTER — Ambulatory Visit (INDEPENDENT_AMBULATORY_CARE_PROVIDER_SITE_OTHER): Payer: Medicare Other | Admitting: Family Medicine

## 2015-08-21 ENCOUNTER — Encounter: Payer: Self-pay | Admitting: Family Medicine

## 2015-08-21 VITALS — BP 102/68 | HR 71 | Temp 98.3°F | Ht 71.0 in | Wt 210.5 lb

## 2015-08-21 DIAGNOSIS — G4700 Insomnia, unspecified: Secondary | ICD-10-CM

## 2015-08-21 DIAGNOSIS — I10 Essential (primary) hypertension: Secondary | ICD-10-CM | POA: Diagnosis not present

## 2015-08-21 DIAGNOSIS — E119 Type 2 diabetes mellitus without complications: Secondary | ICD-10-CM

## 2015-08-21 DIAGNOSIS — J449 Chronic obstructive pulmonary disease, unspecified: Secondary | ICD-10-CM

## 2015-08-21 DIAGNOSIS — F411 Generalized anxiety disorder: Secondary | ICD-10-CM

## 2015-08-21 DIAGNOSIS — E785 Hyperlipidemia, unspecified: Secondary | ICD-10-CM

## 2015-08-21 DIAGNOSIS — Z119 Encounter for screening for infectious and parasitic diseases, unspecified: Secondary | ICD-10-CM

## 2015-08-21 DIAGNOSIS — G2581 Restless legs syndrome: Secondary | ICD-10-CM | POA: Diagnosis not present

## 2015-08-21 DIAGNOSIS — Z0000 Encounter for general adult medical examination without abnormal findings: Secondary | ICD-10-CM | POA: Diagnosis not present

## 2015-08-21 NOTE — Progress Notes (Signed)
Pre visit review using our clinic review tool, if applicable. No additional management support is needed unless otherwise documented below in the visit note.  I have personally reviewed the Medicare Annual Wellness questionnaire and have noted 1. The patient's medical and social history 2. Their use of alcohol, tobacco or illicit drugs 3. Their current medications and supplements 4. The patient's functional ability including ADL's, fall risks, home safety risks and hearing or visual             impairment. 5. Diet and physical activities 6. Evidence for depression or mood disorders  The patients weight, height, BMI have been recorded in the chart and visual acuity is per eye clinic.  I have made referrals, counseling and provided education to the patient based review of the above and I have provided the pt with a written personalized care plan for preventive services.  Provider list updated- see scanned forms.  Routine anticipatory guidance given to patient.  See health maintenance.  Flu 2016 Shingles d/w pt PNA 2016 Tetanus 2005,dw pt Colonoscopy 2008 Prostate cancer screening and PSA options (with potential risks and benefits of testing vs not testing) were discussed along with recent recs/guidelines.  He declined testing PSA at this point. Advance directive- wife designated if patient were incapacitated.   Cognitive function addressed- see scanned forms- and if abnormal then additional documentation follows.  HIV and HCV screening d/w pt. Opts in for next set of labs.    Diabetes:  Using medications without difficulties: yes Hypoglycemic episodes:no Hyperglycemic episodes:no Feet problems: no Blood Sugars averaging: ~110 usually eye exam within last year: due, d/w pt.  Labs d/w pt.   Hypertension:  Using medication without problems or lightheadedness: yes Chest pain with exertion: no Edema:no Short of breath:no  Elevated Cholesterol: Using medications without  problems:yes Muscle aches: no Diet compliance: encouraged, "I held my own with Christmas."  Exercise: mainly chores at home.  LDL up, he'll work on diet.  Still compliant with med.   Mood d/w pt.  Likely related to caring for his wife.  No SI/HI.  Current meds are likely helping, and he isn't at the point of wanting to try to change his meds.    COPD.  Working to stop smoking. Not much wheeze.  Compliant with meds.  No ADE on med.  Clearly improve with current meds.    RLS improved with requip. No ADE on med.  Compliant.    Insomnia improved with trazodone.  No ADE on med.  Compliant.    PMH and SH reviewed  Meds, vitals, and allergies reviewed.   ROS: See HPI.  Otherwise negative.    GEN: nad, alert and oriented HEENT: mucous membranes moist NECK: supple w/o LA CV: rrr. PULM: ctab, no inc wob ABD: soft, +bs EXT: no edema SKIN: no acute rash  Diabetic foot exam: Normal inspection No skin breakdown No calluses  Normal DP pulses Normal sensation to light touch and monofilament Nails normal

## 2015-08-21 NOTE — Patient Instructions (Addendum)
Check with your insurance to see if they will cover the shingles and tetanus shot. Work on [employer] in the meantime and recheck labs before a visit in about 6 months.   Call about the hearing and eye appointments.  Take care. Don't change your meds for now.   Update me as needed.

## 2015-08-22 ENCOUNTER — Telehealth: Payer: Self-pay | Admitting: *Deleted

## 2015-08-22 ENCOUNTER — Other Ambulatory Visit: Payer: Self-pay | Admitting: Family Medicine

## 2015-08-22 MED ORDER — ALPRAZOLAM 0.5 MG PO TABS: 0.5000 mg | ORAL_TABLET | Freq: Two times a day (BID) | ORAL | Status: DC | PRN

## 2015-08-22 MED ORDER — SIMVASTATIN 20 MG PO TABS: ORAL_TABLET | ORAL | Status: DC

## 2015-08-22 MED ORDER — TRAZODONE HCL 50 MG PO TABS: ORAL_TABLET | ORAL | Status: DC

## 2015-08-22 MED ORDER — LISINOPRIL 20 MG PO TABS: 20.0000 mg | ORAL_TABLET | Freq: Every day | ORAL | Status: DC

## 2015-08-22 MED ORDER — DOXAZOSIN MESYLATE 2 MG PO TABS: 2.0000 mg | ORAL_TABLET | Freq: Every day | ORAL | Status: DC

## 2015-08-22 MED ORDER — MELOXICAM 15 MG PO TABS: 15.0000 mg | ORAL_TABLET | Freq: Every day | ORAL | Status: DC

## 2015-08-22 MED ORDER — ALBUTEROL SULFATE HFA 108 (90 BASE) MCG/ACT IN AERS: 2.0000 | INHALATION_SPRAY | Freq: Four times a day (QID) | RESPIRATORY_TRACT | Status: DC | PRN

## 2015-08-22 MED ORDER — POTASSIUM CHLORIDE ER 10 MEQ PO TBCR: 30.0000 meq | EXTENDED_RELEASE_TABLET | Freq: Every day | ORAL | Status: DC

## 2015-08-22 MED ORDER — FINASTERIDE 5 MG PO TABS: 5.0000 mg | ORAL_TABLET | Freq: Every day | ORAL | Status: DC

## 2015-08-22 MED ORDER — TIOTROPIUM BROMIDE MONOHYDRATE 18 MCG IN CAPS: 18.0000 ug | ORAL_CAPSULE | Freq: Every day | RESPIRATORY_TRACT | Status: DC

## 2015-08-22 MED ORDER — FLUOXETINE HCL 40 MG PO CAPS
40.0000 mg | ORAL_CAPSULE | Freq: Every day | ORAL | Status: DC
Start: 2015-08-22 — End: 2016-10-26

## 2015-08-22 MED ORDER — BUDESONIDE-FORMOTEROL FUMARATE 160-4.5 MCG/ACT IN AERO: 1.0000 | INHALATION_SPRAY | Freq: Two times a day (BID) | RESPIRATORY_TRACT | Status: DC

## 2015-08-22 MED ORDER — METFORMIN HCL 500 MG PO TABS: 500.0000 mg | ORAL_TABLET | Freq: Two times a day (BID) | ORAL | Status: DC

## 2015-08-22 MED ORDER — CYCLOBENZAPRINE HCL 10 MG PO TABS: 5.0000 mg | ORAL_TABLET | Freq: Two times a day (BID) | ORAL | Status: DC | PRN

## 2015-08-22 MED ORDER — FLUTICASONE PROPIONATE 50 MCG/ACT NA SUSP: 2.0000 | Freq: Every day | NASAL | Status: AC

## 2015-08-22 MED ORDER — METOPROLOL TARTRATE 25 MG PO TABS: 25.0000 mg | ORAL_TABLET | Freq: Two times a day (BID) | ORAL | Status: DC

## 2015-08-22 MED ORDER — ROPINIROLE HCL 1 MG PO TABS: 1.0000 mg | ORAL_TABLET | Freq: Every day | ORAL | Status: DC

## 2015-08-22 NOTE — Progress Notes (Signed)
Medication phoned to pharmacy.  

## 2015-08-22 NOTE — Assessment & Plan Note (Signed)
Flu 2016 Shingles d/w pt PNA 2016 Tetanus 2005,dw pt Colonoscopy 2008 Prostate cancer screening and PSA options (with potential risks and benefits of testing vs not testing) were discussed along with recent recs/guidelines.  He declined testing PSA at this point. Advance directive- wife designated if patient were incapacitated.   Cognitive function addressed- see scanned forms- and if abnormal then additional documentation follows.  HIV and HCV screening d/w pt. Opts in for next set of labs.

## 2015-08-22 NOTE — Telephone Encounter (Signed)
Yes, form signed, thanks.

## 2015-08-22 NOTE — Assessment & Plan Note (Signed)
Okay for outpatient f/u, improved with meds, not at the point of wanting to change meds, continue as is.  D/w pt.  He agrees.

## 2015-08-22 NOTE — Assessment & Plan Note (Signed)
Continue trazodone, no ADE on med.

## 2015-08-22 NOTE — Progress Notes (Signed)
Please call in xanax rx.  It's a little early.  He hasn't run out yet, but will soon so they'll have rx on file.  Thanks.

## 2015-08-22 NOTE — Assessment & Plan Note (Signed)
Continue statin, no ADE on med.  Needs work on diet and exercise, d/w pt.  He agrees.

## 2015-08-22 NOTE — Assessment & Plan Note (Signed)
Ctab, improved with meds, continue as is.  D/w pt.  He agrees.

## 2015-08-22 NOTE — Assessment & Plan Note (Signed)
Reasonable control, continue as is metformin.  A1c d/w pt.

## 2015-08-22 NOTE — Telephone Encounter (Signed)
Patient's insurance prefers ProAir instead of Albuterol (Proventil HFA; Ventolin HFA).  Can the pharmacy change the prescription.?  Form in your In Box.

## 2015-08-22 NOTE — Assessment & Plan Note (Signed)
Continue requip, no ADE on med.

## 2015-08-22 NOTE — Assessment & Plan Note (Signed)
Continue ACE, no ADE on med.  Needs work on diet and exercise, d/w pt.  He agrees.

## 2015-10-30 ENCOUNTER — Telehealth: Payer: Self-pay | Admitting: Family Medicine

## 2015-10-30 DIAGNOSIS — E119 Type 2 diabetes mellitus without complications: Secondary | ICD-10-CM

## 2015-10-30 NOTE — Telephone Encounter (Signed)
Ordered. Thanks

## 2015-10-30 NOTE — Telephone Encounter (Signed)
Patient called wanting a Referral to [facility] to see Dr Jerrell Mylar there. His wife sees her and he wants to be referred. Please place referral.

## 2015-12-04 DIAGNOSIS — E11649 Type 2 diabetes mellitus with hypoglycemia without coma: Secondary | ICD-10-CM | POA: Diagnosis not present

## 2015-12-04 DIAGNOSIS — Z9884 Bariatric surgery status: Secondary | ICD-10-CM | POA: Diagnosis not present

## 2015-12-04 LAB — BASIC METABOLIC PANEL: CREATININE: 0.9 mg/dL (ref <=1.3)

## 2015-12-04 LAB — HEMOGLOBIN A1C: Hemoglobin A1C: 6.3

## 2016-01-06 ENCOUNTER — Other Ambulatory Visit: Payer: Self-pay | Admitting: Family Medicine

## 2016-02-04 ENCOUNTER — Other Ambulatory Visit: Payer: Self-pay | Admitting: Family Medicine

## 2016-02-04 NOTE — Telephone Encounter (Signed)
Received refill request electronically Last refill 08/22/15 #60/3 Last office visit 08/21/15

## 2016-02-04 NOTE — Telephone Encounter (Signed)
I sent the Rx to the pharmacy.

## 2016-02-12 ENCOUNTER — Other Ambulatory Visit (INDEPENDENT_AMBULATORY_CARE_PROVIDER_SITE_OTHER): Payer: Medicare Other

## 2016-02-12 ENCOUNTER — Other Ambulatory Visit: Payer: Self-pay | Admitting: Family Medicine

## 2016-02-12 DIAGNOSIS — Z119 Encounter for screening for infectious and parasitic diseases, unspecified: Secondary | ICD-10-CM | POA: Diagnosis not present

## 2016-02-13 LAB — HIV ANTIBODY (ROUTINE TESTING W REFLEX): HIV: NONREACTIVE

## 2016-02-13 LAB — HEPATITIS C ANTIBODY: HCV AB: NEGATIVE

## 2016-02-19 ENCOUNTER — Encounter: Payer: Self-pay | Admitting: Family Medicine

## 2016-02-19 ENCOUNTER — Ambulatory Visit (INDEPENDENT_AMBULATORY_CARE_PROVIDER_SITE_OTHER): Payer: Medicare Other | Admitting: Family Medicine

## 2016-02-19 VITALS — BP 130/86 | HR 101 | Temp 98.3°F | Wt 214.2 lb

## 2016-02-19 DIAGNOSIS — E119 Type 2 diabetes mellitus without complications: Secondary | ICD-10-CM

## 2016-02-19 DIAGNOSIS — J449 Chronic obstructive pulmonary disease, unspecified: Secondary | ICD-10-CM

## 2016-02-19 DIAGNOSIS — G4700 Insomnia, unspecified: Secondary | ICD-10-CM | POA: Diagnosis not present

## 2016-02-19 NOTE — Assessment & Plan Note (Signed)
Per endo clinic.

## 2016-02-19 NOTE — Progress Notes (Signed)
Pre visit review using our clinic review tool, if applicable. No additional management support is needed unless otherwise documented below in the visit note.  He is still worried about his wife.  She is still on HD, check on transplant possibilities at [facility].    They recently moved, contact info update in EMR.    Has seen Endo at [facility].  Prev A1c at goal, with f/u pending.  Diet is fair.  Last eye exam is >1 year, d/w pt.    HIV and HCV neg, d/w pt.    Insomnia.  Some relief with trazodone.  No ADE on med.  Doesn't sleep w/o med.  Home situation noted above.  COPD.  Still on baseline inhalers, using SABA usually around 2-3 times a week.  Some wheeze.  Cutting back on smoking, encouraged.  Minimal sputum.  Sx not worse in the summertime.   Shingles and tetanus d/w pt.  He'll check on coverage.   Meds, vitals, and allergies reviewed.   ROS: Per HPI unless specifically indicated in ROS section   GEN: nad, alert and oriented HEENT: mucous membranes moist NECK: supple w/o LA CV: rrr.  no murmur PULM: ctab, no inc wob ABD: soft, +bs EXT: no edema SKIN: no acute rash

## 2016-02-19 NOTE — Assessment & Plan Note (Signed)
Still on baseline inhalers, using SABA usually around 2-3 times a week.  Some wheeze.  Cutting back on smoking, encouraged.  Minimal sputum.  Sx not worse in the summertime.

## 2016-02-19 NOTE — Patient Instructions (Signed)
Call about an eye exam.   I'll await your labs from the endocrine clinic.   Take care.  Glad to see you.

## 2016-02-19 NOTE — Assessment & Plan Note (Signed)
Some relief with trazodone.  No ADE on med.  Doesn't sleep w/o med.  Home situation noted above.

## 2016-03-04 DIAGNOSIS — Z9884 Bariatric surgery status: Secondary | ICD-10-CM | POA: Diagnosis not present

## 2016-03-04 DIAGNOSIS — E11649 Type 2 diabetes mellitus with hypoglycemia without coma: Secondary | ICD-10-CM | POA: Diagnosis not present

## 2016-03-07 ENCOUNTER — Other Ambulatory Visit: Payer: Self-pay | Admitting: Family Medicine

## 2016-03-08 NOTE — Telephone Encounter (Signed)
Please call in.  Thanks.   

## 2016-03-08 NOTE — Telephone Encounter (Signed)
Electronic refill request. Last Filled:    60 tablet 2 08/22/2015  Last office visit:   02/19/16  Please advise.

## 2016-03-09 ENCOUNTER — Other Ambulatory Visit: Payer: Self-pay | Admitting: Family Medicine

## 2016-03-09 NOTE — Telephone Encounter (Signed)
Medication phoned to pharmacy.  

## 2016-04-08 ENCOUNTER — Encounter: Payer: Self-pay | Admitting: Cardiovascular Disease

## 2016-04-08 ENCOUNTER — Ambulatory Visit (INDEPENDENT_AMBULATORY_CARE_PROVIDER_SITE_OTHER): Payer: Medicare Other | Admitting: Cardiovascular Disease

## 2016-04-08 VITALS — BP 100/60 | HR 92 | Ht 71.0 in | Wt 200.0 lb

## 2016-04-08 DIAGNOSIS — I2510 Atherosclerotic heart disease of native coronary artery without angina pectoris: Secondary | ICD-10-CM

## 2016-04-08 DIAGNOSIS — I471 Supraventricular tachycardia: Secondary | ICD-10-CM | POA: Diagnosis not present

## 2016-04-08 DIAGNOSIS — I420 Dilated cardiomyopathy: Secondary | ICD-10-CM

## 2016-04-08 DIAGNOSIS — Z720 Tobacco use: Secondary | ICD-10-CM

## 2016-04-08 DIAGNOSIS — I10 Essential (primary) hypertension: Secondary | ICD-10-CM

## 2016-04-08 DIAGNOSIS — R0602 Shortness of breath: Secondary | ICD-10-CM

## 2016-04-08 DIAGNOSIS — F17200 Nicotine dependence, unspecified, uncomplicated: Secondary | ICD-10-CM

## 2016-04-08 MED ORDER — SACUBITRIL-VALSARTAN 24-26 MG PO TABS: 1.0000 | ORAL_TABLET | Freq: Two times a day (BID) | ORAL | 11 refills | Status: DC

## 2016-04-08 MED ORDER — CARVEDILOL 6.25 MG PO TABS: 6.2500 mg | ORAL_TABLET | Freq: Two times a day (BID) | ORAL | 3 refills | Status: DC

## 2016-04-08 NOTE — Patient Instructions (Addendum)
Medication Instructions:   Stop the metoprolol Start coreg/carvedilol one pill twice a day  Stop the lisinopril, Start entresto 24/26 mg pill twice a day  Labwork:  No labs needed BMP in one month  Testing/Procedures:  No new testing needed   Follow-Up: It was a pleasure seeing you in the office today. Please call us if you have new issues that need to be addressed before your next appt.  [phone]  Your physician wants you to follow-up in: 3 months.  You will receive a reminder letter in the mail two months in advance. If you don't receive a letter, please call our office to schedule the follow-up appointment.  If you need a refill on your cardiac medications before your next appointment, please call your pharmacy.

## 2016-04-08 NOTE — Progress Notes (Signed)
Cardiology Office Note  Date:  04/08/2016   ID:  Mark Morris, DOB Oct 16, 1950, MRN [id number]  PCP:  Elsie Stain, MD   Chief Complaint  Patient presents with  . Other    12 month follow up. Meds reviewed by the patient verbally. "doing well."     HPI:  Mark Morris is a 65 year-old gentleman with history of mild CAD in 2004, Myoview in 2005 showing no significant ischemia, chronic fatigue and poor energy for the past several years, history of SVT requiring adenosine,  cardiac catheterization in 02/2014 that showed mild proximal LAD disease, otherwise mild luminal irregularities. Mild to moderate global hypokinesis. Ejection fraction 35-45%. , nonischemic cardiomyopathy He has chronic back pain lower as well as upper. He is a smoker He presents for follow-up of his coronary artery disease and cardiomyopathy  In follow-up, he reports doing well. He continues to smoke one pack per day. Reports having chronic shortness of breath on exertion, does not think it is worse recently Tolerating his medications Denies any lightheadedness concerning for orthostasis  Previous lab work,Hemoglobin A1c 5.9, total cholesterol 118, LDL 71 Continues on simvastatin 20 mg daily  EKG shows normal sinus rhythm with rate 92 bpm, nonspecific QRS widening, left anterior fascicular block  Other past medical history Prior history of chronic back pain  previousstress test showing ischemia in the inferior wall. Ejection fraction 40%. He had cardiac catheterization on 03/01/2014. This showed mild proximal LAD disease, otherwise mild luminal irregularities. Mild to moderate global hypokinesis. Ejection fraction 35-45%. Significant ectopy noted, APCs. Etiology of his depressed ejection fraction is unclear. He denies alcohol. Unable to exclude sleep apnea  History of carpal tunnel surgery on the left   lab work shows normal ttal testosterone, low free testosterone  PMH:   has a past medical history of  Allergy (2002); Anxiety (08/24/1999); Arthritis; BPH (benign prostatic hyperplasia); CAD (coronary artery disease); COPD (chronic obstructive pulmonary disease) ([place]); DDD (degenerative disc disease), lumbosacral; Depression (08/24/1999); Diabetes mellitus (08/24/2003); Dysrhythmia (2013); Enteritis (10/18/2004); Hearing difficulty of both ears; HNP (herniated nucleus pulposus), lumbar; Hyperlipidemia (08/23/1997); Hypertension (1980); Paroxysmal SVT (supraventricular tachycardia) ([place]); Post-operative infection (2006); Shortness of breath; Sigmoid polyp (07/03/2007); Sleep apnea; and Tobacco abuse.  PSH:    Past Surgical History:  Procedure Laterality Date  . ANKLE FRACTURE SURGERY  02/2003  . APPENDECTOMY    . BACK SURGERY  2010  . CARDIAC CATHETERIZATION  01/14/2003   Min plaque EF65%// Cath normal (Dr. Clayborn Bigness 2000)//ECHO Mild AAA TrMR Ef61% 05/30/2002 (Dr.Callwood)  . CARDIAC CATHETERIZATION  2015   [facility]  . CARPAL TUNNEL RELEASE Bilateral   . CHOLECYSTECTOMY    . COLONOSCOPY    . GASTRIC BYPASS  11/24/2004  . GASTRIC FUNDOPLICATION  7262   [facility]  . HERNIA REPAIR  01/02/2006   right ventral Hernia Repair Laparascopic [place] Pai  . KNEE ARTHROSCOPY W/ MENISCAL REPAIR Right   . MAXIMUM ACCESS (MAS)POSTERIOR LUMBAR INTERBODY FUSION (PLIF) 2 LEVEL N/A 04/12/2014   Procedure: Lumbar Four-Five Lumbar Five-Sacral One Redo Laminectomy and Posterior Lumbar Interbody Fusion;  Surgeon: Erline Levine, MD;  Location: [place] NEURO ORS;  Service: Neurosurgery;  Laterality: N/A;  Lumbar Four-Five Lumbar Five-Sacral One Redo Laminectomy and Posterior Lumbar Interbody Fusion  . sleep study  03/2004   Sleep Study Hypopneas 60/hr ; Kicks 160/hr    Current Outpatient Prescriptions  Medication Sig Dispense Refill  . albuterol (PROVENTIL HFA;VENTOLIN HFA) 108 (90 Base) MCG/ACT inhaler Inhale 2 puffs into the lungs every 6 (six)  hours as needed for wheezing or shortness of breath. 18 g 12  . ALPRAZolam  (XANAX) 0.5 MG tablet TAKE ONE TABLET BY MOUTH TWICE DAILY AS NEEDED 60 tablet 2  . Blood Glucose Monitoring Suppl (ONE TOUCH ULTRA SYSTEM KIT) W/DEVICE KIT Check blood sugar once daily and as directed. Ex E11.9 1 each 0  . budesonide-formoterol (SYMBICORT) 160-4.5 MCG/ACT inhaler Inhale 1 puff into the lungs 2 (two) times daily. Rinse after use 3 Inhaler 3  . calcium elemental as carbonate (CALCIUM ANTACID ULTRA) 400 MG tablet Chew 1,000 mg by mouth daily.      . cyclobenzaprine (FLEXERIL) 10 MG tablet TAKE ONE-HALF TO ONE TABLET BY MOUTH TWICE DAILY AS NEEDED FOR  MUSCLE  SPASMS  (SEDATION  CAUTION) 60 tablet 3  . doxazosin (CARDURA) 2 MG tablet Take 1 tablet (2 mg total) by mouth daily. 90 tablet 3  . finasteride (PROSCAR) 5 MG tablet Take 1 tablet (5 mg total) by mouth daily. 90 tablet 3  . FLUoxetine (PROZAC) 40 MG capsule Take 1 capsule (40 mg total) by mouth daily. 90 capsule 3  . fluticasone (FLONASE) 50 MCG/ACT nasal spray Place 2 sprays into both nostrils daily. 16 g 12  . glucose blood (ONE TOUCH ULTRA TEST) test strip Use as instructed to test blood sugar 1-2 times daily.  Diagnosis:E11.9.  History of high and low sugars, with need for mult testing per day 200 each 3  . guaiFENesin (MUCINEX) 600 MG 12 hr tablet Take 1 tablet (600 mg total) by mouth 2 (two) times daily.    Marland Kitchen lisinopril (PRINIVIL,ZESTRIL) 20 MG tablet Take 1 tablet (20 mg total) by mouth daily. 90 tablet 3  . meloxicam (MOBIC) 15 MG tablet Take 1 tablet (15 mg total) by mouth daily. With food 90 tablet 3  . metFORMIN (GLUCOPHAGE) 500 MG tablet Take 1 tablet (500 mg total) by mouth 2 (two) times daily with a meal. 180 tablet 3  . metoprolol tartrate (LOPRESSOR) 25 MG tablet Take 1 tablet (25 mg total) by mouth 2 (two) times daily. 180 tablet 3  . Multiple Vitamin (MULTIVITAMIN) tablet Take 1 tablet by mouth daily.      . ONE TOUCH ULTRA TEST test strip USE STRIP AS INSTRUCTED TO TEST BLOOD SUGAR ONCE DAILY. 200 each 1  .  ONETOUCH DELICA LANCETS FINE MISC check blood sugar once daily and as directed.E11.9 100 each 3  . potassium chloride (K-DUR) 10 MEQ tablet Take 3 tablets (30 mEq total) by mouth daily. 270 tablet 3  . rOPINIRole (REQUIP) 1 MG tablet Take 1 tablet (1 mg total) by mouth at bedtime. 90 tablet 3  . simvastatin (ZOCOR) 20 MG tablet TAKE ONE TABLET BY MOUTH ONCE DAILY 90 tablet 3  . tiotropium (SPIRIVA HANDIHALER) 18 MCG inhalation capsule Place 1 capsule (18 mcg total) into inhaler and inhale daily. 90 capsule 3  . traZODone (DESYREL) 50 MG tablet TAKE ONE-HALF TO ONE TABLET BY MOUTH AT BEDTIME AS NEEDED FOR SLEEP 90 tablet 3   No current facility-administered medications for this visit.      Allergies:   Codeine and Sulfonamide derivatives   Social History:  The patient  reports that he has been smoking Cigarettes.  He has a 40.00 pack-year smoking history. He has never used smokeless tobacco. He reports that he does not drink alcohol or use drugs.   Family History:   family history includes Alcohol abuse in his brother; Alcohol abuse (age of onset: 58) in his  brother; Diabetes in his brother, brother, and mother; Heart disease (age of onset: 36) in his mother; Heart disease (age of onset: 21) in his father; Hypertension in his father.    Review of Systems: Review of Systems  Constitutional: Negative.   Respiratory: Positive for shortness of breath.   Cardiovascular: Negative.   Gastrointestinal: Negative.   Musculoskeletal: Negative.   Neurological: Negative.   Psychiatric/Behavioral: Negative.   All other systems reviewed and are negative.    PHYSICAL EXAM: VS:  BP 100/60 (BP Location: Left Arm, Patient Position: Sitting, Cuff Size: Normal)   Pulse 92   Ht 5' 11"  (1.803 m)   Wt 200 lb (90.7 kg)   BMI 27.89 kg/m  , BMI Body mass index is 27.89 kg/m. GEN: Well nourished, well developed, in no acute distress  HEENT: normal  Neck: no JVD, carotid bruits, or masses Cardiac: RRR;  no murmurs, rubs, or gallops,no edema  Respiratory:  clear to auscultation bilaterally, normal work of breathing GI: soft, nontender, nondistended, + BS MS: no deformity or atrophy  Skin: warm and dry, no rash Neuro:  Strength and sensation are intact Psych: euthymic mood, full affect    Recent Labs: 08/13/2015: ALT 25; BUN 19; Potassium 4.6; Sodium 139 12/04/2015: Creatinine 0.9    Lipid Panel Lab Results  Component Value Date   CHOL 181 08/13/2015   HDL 52.50 08/13/2015   LDLCALC 107 (H) 08/13/2015   TRIG 108.0 08/13/2015      Wt Readings from Last 3 Encounters:  04/08/16 200 lb (90.7 kg)  02/19/16 214 lb 4 oz (97.2 kg)  08/21/15 210 lb 8 oz (95.5 kg)       ASSESSMENT AND PLAN:  Coronary artery disease involving native coronary artery of native heart without angina pectoris - Plan: EKG 12-Lead Currently with no symptoms of angina. No further workup at this time. Continue current medication regimen.  Dilated cardiomyopathy Long discussion concerning his low ejection fraction, other treatment options Previous ejection fraction 35 to 40% Recommended he change metoprolol to carvedilol Also recommended he stop lisinopril and start  entresto 24/26 mg po twice a day Blood pressure is low  Medication titration in the future if possible BNP in one month  PAROXYSMAL SUPRAVENTRICULAR TACHYCARDIA - Plan: EKG 12-Lead Denies any symptoms concerning for tachycardia We will change him from metoprolol to carvedilol given his cardiomyopathy  Shortness of breath Possibly secondary to cardiomyopathy, unable to exclude COPD Continues to smoke  Smoker We have encouraged him to continue to work on weaning his cigarettes and smoking cessation. He will continue to work on this and does not want any assistance with chantix.   Disposition:   F/U  6 months   Total encounter time more than 25 minutes  Greater than 50% was spent in counseling and coordination of care with the  patient   Orders Placed This Encounter  Procedures  . EKG 12-Lead     Signed, Esmond Plants, M.D., Ph.D. 04/08/2016  [place], [place]

## 2016-04-23 ENCOUNTER — Other Ambulatory Visit: Payer: Self-pay | Admitting: Family Medicine

## 2016-05-10 DIAGNOSIS — E113393 Type 2 diabetes mellitus with moderate nonproliferative diabetic retinopathy without macular edema, bilateral: Secondary | ICD-10-CM | POA: Diagnosis not present

## 2016-05-13 ENCOUNTER — Other Ambulatory Visit: Payer: Self-pay | Admitting: Cardiovascular Disease

## 2016-05-13 ENCOUNTER — Other Ambulatory Visit (INDEPENDENT_AMBULATORY_CARE_PROVIDER_SITE_OTHER): Payer: Medicare Other

## 2016-05-13 DIAGNOSIS — I2510 Atherosclerotic heart disease of native coronary artery without angina pectoris: Secondary | ICD-10-CM

## 2016-05-13 DIAGNOSIS — I471 Supraventricular tachycardia: Secondary | ICD-10-CM | POA: Diagnosis not present

## 2016-05-13 DIAGNOSIS — I10 Essential (primary) hypertension: Secondary | ICD-10-CM | POA: Diagnosis not present

## 2016-05-14 LAB — BASIC METABOLIC PANEL
BUN/Creatinine Ratio: 17 (ref 10–24)
BUN: 13 mg/dL (ref 8–27)
CALCIUM: 8.6 mg/dL (ref 8.6–10.2)
CHLORIDE: 104 mmol/L (ref 96–106)
CO2: 25 mmol/L (ref 18–29)
Creatinine, Ser: 0.77 mg/dL (ref 0.76–1.27)
GFR calc Af Amer: 112 mL/min/{1.73_m2} (ref >59)
GFR calc non Af Amer: 97 mL/min/{1.73_m2} (ref >59)
GLUCOSE: 93 mg/dL (ref 65–99)
Potassium: 4.6 mmol/L (ref 3.5–5.2)
Sodium: 145 mmol/L — ABNORMAL HIGH (ref 134–144)

## 2016-05-24 ENCOUNTER — Telehealth: Payer: Self-pay | Admitting: *Deleted

## 2016-05-24 NOTE — Telephone Encounter (Signed)
-----   Message from Minna Merritts, MD sent at 05/23/2016 12:07 PM EDT ----- Bmp results Looks within normal limits Would limit salt intake

## 2016-05-24 NOTE — Telephone Encounter (Signed)
Need full blood pressure numbers Unclear if he knows which one is diastolic Previous diastolic pressure in the office was 49 Strange that it would be now 100 up to 120s Wonder if he is mistaking diastolic and systolic

## 2016-05-24 NOTE — Telephone Encounter (Signed)
Reviewed results with patient and he had no questions. He reported that his diastolic numbers have been running  100's-120's since being on Entresto and wanted to know if he should go back to what he was on previously. He did not have any blood pressure readings and so I instructed him to start monitoring his blood pressures twice a day and write them down. Also instructed him to give [place] a call if they remain greater than 140/80 and we can then see what adjustments may need to be done at that time. He verbalized understanding, agreement with plan, and had no further questions.

## 2016-07-06 ENCOUNTER — Telehealth: Payer: Self-pay | Admitting: Cardiovascular Disease

## 2016-07-06 MED ORDER — SACUBITRIL-VALSARTAN 49-51 MG PO TABS: 1.0000 | ORAL_TABLET | Freq: Two times a day (BID) | ORAL | 6 refills | Status: DC

## 2016-07-06 NOTE — Telephone Encounter (Signed)
Pt here in the office w/ family member.  Dr. Rockey Situ increased Delene Loll to 49/51 mg BID.  Samples provided.

## 2016-07-08 ENCOUNTER — Ambulatory Visit: Payer: Medicare Other | Admitting: Cardiovascular Disease

## 2016-07-08 DIAGNOSIS — E7800 Pure hypercholesterolemia, unspecified: Secondary | ICD-10-CM | POA: Diagnosis not present

## 2016-07-08 DIAGNOSIS — E559 Vitamin D deficiency, unspecified: Secondary | ICD-10-CM | POA: Diagnosis not present

## 2016-07-08 DIAGNOSIS — E538 Deficiency of other specified B group vitamins: Secondary | ICD-10-CM | POA: Diagnosis not present

## 2016-07-08 DIAGNOSIS — Z9884 Bariatric surgery status: Secondary | ICD-10-CM | POA: Diagnosis not present

## 2016-07-08 DIAGNOSIS — E11649 Type 2 diabetes mellitus with hypoglycemia without coma: Secondary | ICD-10-CM | POA: Diagnosis not present

## 2016-07-08 LAB — HEMOGLOBIN A1C: HEMOGLOBIN A1C: 6.2

## 2016-07-09 DIAGNOSIS — E119 Type 2 diabetes mellitus without complications: Secondary | ICD-10-CM | POA: Diagnosis not present

## 2016-07-09 DIAGNOSIS — E7800 Pure hypercholesterolemia, unspecified: Secondary | ICD-10-CM | POA: Diagnosis not present

## 2016-07-09 DIAGNOSIS — E11649 Type 2 diabetes mellitus with hypoglycemia without coma: Secondary | ICD-10-CM | POA: Diagnosis not present

## 2016-07-20 ENCOUNTER — Ambulatory Visit: Payer: Medicare Other | Admitting: Cardiovascular Disease

## 2016-07-21 ENCOUNTER — Other Ambulatory Visit: Payer: Self-pay | Admitting: Family Medicine

## 2016-07-21 NOTE — Telephone Encounter (Signed)
Last office visit 08/21/2015.  Flexeril last refilled 02/07/2016 for #60 with 3 refills.  Alprazolam last refilled 03/08/2016 for #60 with 2 refills.  Ok to refill?

## 2016-07-22 NOTE — Telephone Encounter (Signed)
Seen 02/19/16.  Flexeril sent, please call in xanax.  Thanks.

## 2016-07-22 NOTE — Telephone Encounter (Signed)
Medication phoned to pharmacy.  

## 2016-07-27 ENCOUNTER — Encounter: Payer: Self-pay | Admitting: Cardiovascular Disease

## 2016-07-27 ENCOUNTER — Ambulatory Visit (INDEPENDENT_AMBULATORY_CARE_PROVIDER_SITE_OTHER): Payer: Medicare Other | Admitting: Cardiovascular Disease

## 2016-07-27 VITALS — BP 126/97 | HR 107 | Ht 71.0 in | Wt 222.8 lb

## 2016-07-27 DIAGNOSIS — I420 Dilated cardiomyopathy: Secondary | ICD-10-CM

## 2016-07-27 DIAGNOSIS — I471 Supraventricular tachycardia: Secondary | ICD-10-CM | POA: Diagnosis not present

## 2016-07-27 DIAGNOSIS — R000 Tachycardia, unspecified: Secondary | ICD-10-CM

## 2016-07-27 DIAGNOSIS — I10 Essential (primary) hypertension: Secondary | ICD-10-CM

## 2016-07-27 DIAGNOSIS — F17200 Nicotine dependence, unspecified, uncomplicated: Secondary | ICD-10-CM

## 2016-07-27 DIAGNOSIS — E119 Type 2 diabetes mellitus without complications: Secondary | ICD-10-CM

## 2016-07-27 DIAGNOSIS — I2510 Atherosclerotic heart disease of native coronary artery without angina pectoris: Secondary | ICD-10-CM

## 2016-07-27 DIAGNOSIS — Z23 Encounter for immunization: Secondary | ICD-10-CM

## 2016-07-27 DIAGNOSIS — E782 Mixed hyperlipidemia: Secondary | ICD-10-CM

## 2016-07-27 DIAGNOSIS — R5382 Chronic fatigue, unspecified: Secondary | ICD-10-CM

## 2016-07-27 DIAGNOSIS — J432 Centrilobular emphysema: Secondary | ICD-10-CM

## 2016-07-27 NOTE — Progress Notes (Signed)
Cardiology Office Note  Date:  07/27/2016   ID:  Mark Morris, DOB 01-20-1951, MRN [id number]  PCP:  Mark Stain, Morris   Chief Complaint  Patient presents with  . other    3 month f/u c/o elevated BP. Meds reviewed verbally with pt.    HPI:  Mark Morris is a 65 year-old gentleman with history of mild CAD in 2004, Myoview in 2005 showing no significant ischemia, chronic fatigue and poor energy for the past several years, history of SVT requiring adenosine, cardiac catheterization in 02/2014 that showed mild proximal LAD disease, otherwise mild luminal irregularities. Mild to moderate global hypokinesis. Ejection fraction 35-45%. , nonischemic cardiomyopathy He has chronic back pain lower as well as upper. He is a smoker He presents for follow-up of his coronary artery disease and cardiomyopathy  Stress at home New neighbors, making noise, Kicked out of appt  Otherwise feels well, On entresto and coreg,   He continues to smoke one pack per day. Reports having chronic shortness of breath on exertion  Previous lab work,Hemoglobin A1c 5.9, total cholesterol 118, LDL 71 Continues on simvastatin 20 mg daily  Weight up 20 pounds  EKG shows normal sinus rhythm with rate 107 bpm, nonspecific QRS widening, left anterior fascicular block  Other past medical history Prior history of chronic back pain  Previous stress test showing ischemia in the inferior wall. Ejection fraction 40%. He had cardiac catheterization on 03/01/2014. This showed mild proximal LAD disease, otherwise mild luminal irregularities. Mild to moderate global hypokinesis. Ejection fraction 35-45%. Significant ectopy noted, APCs. Etiology of his depressed ejection fraction is unclear. He denies alcohol. Unable to exclude sleep apnea  History of carpal tunnel surgery on the left  lab work shows normal ttal testosterone, low free testosterone   PMH:   has a past medical history of Allergy (2002); Anxiety  (08/24/1999); Arthritis; BPH (benign prostatic hyperplasia); CAD (coronary artery disease); COPD (chronic obstructive pulmonary disease) ([place]); DDD (degenerative disc disease), lumbosacral; Depression (08/24/1999); Diabetes mellitus (08/24/2003); Dysrhythmia (2013); Enteritis (10/18/2004); Hearing difficulty of both ears; HNP (herniated nucleus pulposus), lumbar; Hyperlipidemia (08/23/1997); Hypertension (1980); Paroxysmal SVT (supraventricular tachycardia) ([place]); Post-operative infection (2006); Shortness of breath; Sigmoid polyp (07/03/2007); Sleep apnea; and Tobacco abuse.  PSH:    Past Surgical History:  Procedure Laterality Date  . ANKLE FRACTURE SURGERY  02/2003  . APPENDECTOMY    . BACK SURGERY  2010  . CARDIAC CATHETERIZATION  01/14/2003   Min plaque EF65%// Cath normal (Mark Morris 2000)//ECHO Mild AAA TrMR Ef61% 05/30/2002 (Mark Morris)  . CARDIAC CATHETERIZATION  2015   [facility]  . CARPAL TUNNEL RELEASE Bilateral   . CHOLECYSTECTOMY    . COLONOSCOPY    . GASTRIC BYPASS  11/24/2004  . GASTRIC FUNDOPLICATION  3382   [facility]  . HERNIA REPAIR  01/02/2006   right ventral Hernia Repair Laparascopic Mark Morris  . KNEE ARTHROSCOPY W/ MENISCAL REPAIR Right   . MAXIMUM ACCESS (MAS)POSTERIOR LUMBAR INTERBODY FUSION (PLIF) 2 LEVEL N/A 04/12/2014   Procedure: Lumbar Four-Five Lumbar Five-Sacral One Redo Laminectomy and Posterior Lumbar Interbody Fusion;  Surgeon: Mark Morris;  Location: [facility];  Service: Neurosurgery;  Laterality: N/A;  Lumbar Four-Five Lumbar Five-Sacral One Redo Laminectomy and Posterior Lumbar Interbody Fusion  . sleep study  03/2004   Sleep Study Hypopneas 60/hr ; Kicks 160/hr    Current Outpatient Prescriptions  Medication Sig Dispense Refill  . albuterol (PROVENTIL HFA;VENTOLIN HFA) 108 (90 Base) MCG/ACT inhaler Inhale 2 puffs into the lungs every  6 (six) hours as needed for wheezing or shortness of breath. 18 g 12  . ALPRAZolam (XANAX) 0.5 MG tablet TAKE  ONE TABLET BY MOUTH TWICE DAILY AS NEEDED 60 tablet 2  . Blood Glucose Monitoring Suppl (ONE TOUCH ULTRA SYSTEM KIT) W/DEVICE KIT Check blood sugar once daily and as directed. Ex E11.9 1 each 0  . budesonide-formoterol (SYMBICORT) 160-4.5 MCG/ACT inhaler Inhale 1 puff into the lungs 2 (two) times daily. Rinse after use 3 Inhaler 3  . calcium elemental as carbonate (CALCIUM ANTACID ULTRA) 400 MG tablet Chew 1,000 mg by mouth daily.      . carvedilol (COREG) 6.25 MG tablet Take 1 tablet (6.25 mg total) by mouth 2 (two) times daily. 180 tablet 3  . cyclobenzaprine (FLEXERIL) 10 MG tablet TAKE ONE-HALF TO ONE TABLET BY MOUTH TWICE DAILY AS NEEDED FOR MUSCLE SPASM (CAUSES SEDATION) 60 tablet 3  . doxazosin (CARDURA) 2 MG tablet Take 1 tablet (2 mg total) by mouth daily. 90 tablet 3  . finasteride (PROSCAR) 5 MG tablet Take 1 tablet (5 mg total) by mouth daily. 90 tablet 3  . FLUoxetine (PROZAC) 40 MG capsule Take 1 capsule (40 mg total) by mouth daily. 90 capsule 3  . fluticasone (FLONASE) 50 MCG/ACT nasal spray Place 2 sprays into both nostrils daily. 16 g 12  . glucose blood (ONE TOUCH ULTRA TEST) test strip Use as instructed to test blood sugar 1-2 times daily.  Diagnosis:E11.9.  History of high and low sugars, with need for mult testing per day 200 each 3  . guaiFENesin (MUCINEX) 600 MG 12 hr tablet Take 1 tablet (600 mg total) by mouth 2 (two) times daily.    . meloxicam (MOBIC) 15 MG tablet Take 1 tablet (15 mg total) by mouth daily. With food 90 tablet 3  . metFORMIN (GLUCOPHAGE) 500 MG tablet Take 1 tablet (500 mg total) by mouth 2 (two) times daily with a meal. 180 tablet 3  . Multiple Vitamin (MULTIVITAMIN) tablet Take 1 tablet by mouth daily.      . ONE TOUCH ULTRA TEST test strip USE STRIP AS INSTRUCTED TO TEST BLOOD SUGAR ONCE DAILY. 50 each 15  . ONETOUCH DELICA LANCETS FINE MISC check blood sugar once daily and as directed.E11.9 100 each 3  . potassium chloride (K-DUR) 10 MEQ tablet Take 3  tablets (30 mEq total) by mouth daily. 270 tablet 3  . rOPINIRole (REQUIP) 1 MG tablet Take 1 tablet (1 mg total) by mouth at bedtime. 90 tablet 3  . sacubitril-valsartan (ENTRESTO) 49-51 MG Take 1 tablet by mouth 2 (two) times daily. 60 tablet 6  . simvastatin (ZOCOR) 20 MG tablet TAKE ONE TABLET BY MOUTH ONCE DAILY 90 tablet 3  . tiotropium (SPIRIVA HANDIHALER) 18 MCG inhalation capsule Place 1 capsule (18 mcg total) into inhaler and inhale daily. 90 capsule 3  . traZODone (DESYREL) 50 MG tablet TAKE ONE-HALF TO ONE TABLET BY MOUTH AT BEDTIME AS NEEDED FOR SLEEP 90 tablet 3   No current facility-administered medications for this visit.      Allergies:   Codeine and Sulfonamide derivatives   Social History:  The patient  reports that he has been smoking Cigarettes.  He has a 40.00 pack-year smoking history. He has never used smokeless tobacco. He reports that he does not drink alcohol or use drugs.   Family History:   family history includes Alcohol abuse in his brother; Alcohol abuse (age of onset: 76) in his brother; Diabetes in his  brother, brother, and mother; Heart disease (age of onset: 45) in his mother; Heart disease (age of onset: 32) in his father; Hypertension in his father.    Review of Systems: Review of Systems  Constitutional: Negative.   Respiratory: Negative.   Cardiovascular: Negative.   Gastrointestinal: Negative.   Musculoskeletal: Negative.   Neurological: Negative.   Psychiatric/Behavioral: Negative.   All other systems reviewed and are negative.    PHYSICAL EXAM: VS:  BP (!) 126/97 (BP Location: Left Arm, Patient Position: Sitting, Cuff Size: Normal)   Pulse (!) 107   Ht 5' 11"  (1.803 m)   Wt 222 lb 12 oz (101 kg)   BMI 31.07 kg/m  , BMI Body mass index is 31.07 kg/m. GEN: Well nourished, well developed, in no acute distress  HEENT: normal  Neck: no JVD, carotid bruits, or masses Cardiac: RRR; no murmurs, rubs, or gallops,no edema  Respiratory:   Mildly decreased breath sounds throughout, normal work of breathing GI: soft, nontender, nondistended, + BS MS: no deformity or atrophy  Skin: warm and dry, no rash Neuro:  Strength and sensation are intact Psych: euthymic mood, full affect    Recent Labs: 08/13/2015: ALT 25 05/13/2016: BUN 13; Creatinine, Ser 0.77; Potassium 4.6; Sodium 145    Lipid Panel Lab Results  Component Value Date   CHOL 181 08/13/2015   HDL 52.50 08/13/2015   LDLCALC 107 (H) 08/13/2015   TRIG 108.0 08/13/2015      Wt Readings from Last 3 Encounters:  07/27/16 222 lb 12 oz (101 kg)  04/08/16 200 lb (90.7 kg)  02/19/16 214 lb 4 oz (97.2 kg)       ASSESSMENT AND PLAN:  Essential hypertension - Plan: EKG 12-Lead Blood pressure is well controlled on today's visit. No changes made to the medications.  PAROXYSMAL SUPRAVENTRICULAR TACHYCARDIA - Plan: EKG 12-Lead No significant arrhythmia, Continue same meds  Coronary artery disease involving native coronary artery of native heart without angina pectoris - Plan: EKG 12-Lead Currently with no symptoms of angina. No further workup at this time. Continue current medication regimen.  Encounter for immunization - Plan: Flu Vaccine QUAD 36+ mos IM Had flu shot today  Congestive dilated cardiomyopathy ([place]) On entresto, coreg,  Diabetes mellitus without complication ([place]) We have encouraged continued exercise, careful diet management in an effort to lose weight.  SMOKER We have encouraged him to continue to work on weaning his cigarettes and smoking cessation. He will continue to work on this and does not want any assistance with chantix.  Mixed hyperlipidemia  Chronic fatigue Secondary to stress  Sinus tachycardia Secondary to cardiomyopathy, smoking  Centrilobular emphysema ([place]) We have encouraged him to continue to work on weaning his cigarettes   Total encounter time more than 25 minutes  Greater than 50% was spent in counseling and  coordination of care with the patient   Disposition:   F/U  6 months   Orders Placed This Encounter  Procedures  . Flu Vaccine QUAD 36+ mos IM  . EKG 12-Lead     Signed, Esmond Plants, M.D., Ph.D. 07/27/2016  [place], [place]

## 2016-07-27 NOTE — Patient Instructions (Addendum)

## 2016-08-31 ENCOUNTER — Other Ambulatory Visit: Payer: Self-pay | Admitting: Family Medicine

## 2016-08-31 NOTE — Telephone Encounter (Signed)
Pt left v/m requesting status of refills sent today by [facility]. Advised pt already done; pt will ck with pharmacy.

## 2016-09-07 DIAGNOSIS — E538 Deficiency of other specified B group vitamins: Secondary | ICD-10-CM | POA: Diagnosis not present

## 2016-09-28 ENCOUNTER — Ambulatory Visit: Payer: Medicare Other

## 2016-10-05 ENCOUNTER — Encounter: Payer: Medicare Other | Admitting: Family Medicine

## 2016-10-05 DIAGNOSIS — E7800 Pure hypercholesterolemia, unspecified: Secondary | ICD-10-CM | POA: Diagnosis not present

## 2016-10-06 ENCOUNTER — Other Ambulatory Visit: Payer: Self-pay | Admitting: Family Medicine

## 2016-10-07 NOTE — Telephone Encounter (Signed)
Pt has CPE scheduled on 10/26/16, last filled on 08/22/15 #90 tabs with 3 additional refills, please advise

## 2016-10-08 NOTE — Telephone Encounter (Signed)
Sent. Thanks.   

## 2016-10-12 DIAGNOSIS — E538 Deficiency of other specified B group vitamins: Secondary | ICD-10-CM | POA: Diagnosis not present

## 2016-10-12 DIAGNOSIS — Z9884 Bariatric surgery status: Secondary | ICD-10-CM | POA: Diagnosis not present

## 2016-10-12 DIAGNOSIS — D508 Other iron deficiency anemias: Secondary | ICD-10-CM | POA: Diagnosis not present

## 2016-10-12 DIAGNOSIS — E559 Vitamin D deficiency, unspecified: Secondary | ICD-10-CM | POA: Diagnosis not present

## 2016-10-12 DIAGNOSIS — E11649 Type 2 diabetes mellitus with hypoglycemia without coma: Secondary | ICD-10-CM | POA: Diagnosis not present

## 2016-10-19 DIAGNOSIS — E538 Deficiency of other specified B group vitamins: Secondary | ICD-10-CM | POA: Diagnosis not present

## 2016-10-21 ENCOUNTER — Ambulatory Visit: Payer: Medicare Other

## 2016-10-26 ENCOUNTER — Encounter: Payer: Self-pay | Admitting: Family Medicine

## 2016-10-26 ENCOUNTER — Ambulatory Visit (INDEPENDENT_AMBULATORY_CARE_PROVIDER_SITE_OTHER): Payer: Medicare Other | Admitting: Family Medicine

## 2016-10-26 VITALS — BP 112/78 | HR 101 | Temp 97.9°F | Ht 70.25 in | Wt 223.0 lb

## 2016-10-26 DIAGNOSIS — J432 Centrilobular emphysema: Secondary | ICD-10-CM

## 2016-10-26 DIAGNOSIS — E119 Type 2 diabetes mellitus without complications: Secondary | ICD-10-CM | POA: Diagnosis not present

## 2016-10-26 DIAGNOSIS — I509 Heart failure, unspecified: Secondary | ICD-10-CM | POA: Diagnosis not present

## 2016-10-26 DIAGNOSIS — I420 Dilated cardiomyopathy: Secondary | ICD-10-CM | POA: Diagnosis not present

## 2016-10-26 DIAGNOSIS — F411 Generalized anxiety disorder: Secondary | ICD-10-CM

## 2016-10-26 DIAGNOSIS — G4700 Insomnia, unspecified: Secondary | ICD-10-CM | POA: Diagnosis not present

## 2016-10-26 DIAGNOSIS — N400 Enlarged prostate without lower urinary tract symptoms: Secondary | ICD-10-CM

## 2016-10-26 DIAGNOSIS — G2581 Restless legs syndrome: Secondary | ICD-10-CM | POA: Diagnosis not present

## 2016-10-26 DIAGNOSIS — Z125 Encounter for screening for malignant neoplasm of prostate: Secondary | ICD-10-CM

## 2016-10-26 LAB — CBC WITH DIFFERENTIAL/PLATELET
BASOS ABS: 0.2 10*3/uL — AB (ref 0.0–0.1)
BASOS PCT: 1.4 % (ref 0.0–3.0)
EOS ABS: 0.3 10*3/uL (ref 0.0–0.7)
Eosinophils Relative: 2.7 % (ref 0.0–5.0)
HEMATOCRIT: 33.2 % — AB (ref 39.0–52.0)
Hemoglobin: 9.6 g/dL — ABNORMAL LOW (ref 13.0–17.0)
LYMPHS ABS: 3 10*3/uL (ref 0.7–4.0)
LYMPHS PCT: 26.9 % (ref 12.0–46.0)
MCHC: 29.1 g/dL — ABNORMAL LOW (ref 30.0–36.0)
MONOS PCT: 5.5 % (ref 3.0–12.0)
Monocytes Absolute: 0.6 10*3/uL (ref 0.1–1.0)
NEUTROS ABS: 7 10*3/uL (ref 1.4–7.7)
NEUTROS PCT: 63.5 % (ref 43.0–77.0)
PLATELETS: 365 10*3/uL (ref 150.0–400.0)
RBC: 5.06 Mil/uL (ref 4.22–5.81)
RDW: 19.1 % — AB (ref 11.5–15.5)
WBC: 11 10*3/uL — ABNORMAL HIGH (ref 4.0–10.5)

## 2016-10-26 LAB — BASIC METABOLIC PANEL
BUN: 15 mg/dL (ref 6–23)
CHLORIDE: 107 meq/L (ref 96–112)
CO2: 29 mEq/L (ref 19–32)
CREATININE: 0.85 mg/dL (ref 0.40–1.50)
Calcium: 8.9 mg/dL (ref 8.4–10.5)
GFR: 96.59 mL/min (ref >60.00)
Glucose, Bld: 107 mg/dL — ABNORMAL HIGH (ref 70–99)
Potassium: 4.6 mEq/L (ref 3.5–5.1)
Sodium: 142 mEq/L (ref 135–145)

## 2016-10-26 LAB — PSA, MEDICARE: PSA: 0.19 ng/mL (ref 0.10–4.00)

## 2016-10-26 MED ORDER — FINASTERIDE 5 MG PO TABS: 5.0000 mg | ORAL_TABLET | Freq: Every day | ORAL | 3 refills | Status: AC

## 2016-10-26 MED ORDER — TIOTROPIUM BROMIDE MONOHYDRATE 18 MCG IN CAPS: 18.0000 ug | ORAL_CAPSULE | Freq: Every day | RESPIRATORY_TRACT | 3 refills | Status: AC

## 2016-10-26 MED ORDER — ALBUTEROL SULFATE HFA 108 (90 BASE) MCG/ACT IN AERS: 2.0000 | INHALATION_SPRAY | Freq: Four times a day (QID) | RESPIRATORY_TRACT | 12 refills | Status: DC | PRN

## 2016-10-26 MED ORDER — FLUOXETINE HCL 40 MG PO CAPS: 40.0000 mg | ORAL_CAPSULE | Freq: Every day | ORAL | 3 refills | Status: AC

## 2016-10-26 MED ORDER — METFORMIN HCL 500 MG PO TABS: 1000.0000 mg | ORAL_TABLET | Freq: Two times a day (BID) | ORAL | Status: AC

## 2016-10-26 MED ORDER — ROPINIROLE HCL 1 MG PO TABS: 1.0000 mg | ORAL_TABLET | Freq: Every day | ORAL | 3 refills | Status: AC

## 2016-10-26 MED ORDER — BUDESONIDE-FORMOTEROL FUMARATE 160-4.5 MCG/ACT IN AERO: 1.0000 | INHALATION_SPRAY | Freq: Two times a day (BID) | RESPIRATORY_TRACT | 3 refills | Status: AC

## 2016-10-26 MED ORDER — POTASSIUM CHLORIDE ER 10 MEQ PO TBCR: 30.0000 meq | EXTENDED_RELEASE_TABLET | Freq: Every day | ORAL | 3 refills | Status: DC

## 2016-10-26 MED ORDER — TRAZODONE HCL 50 MG PO TABS: ORAL_TABLET | ORAL | 3 refills | Status: AC

## 2016-10-26 NOTE — Progress Notes (Signed)
Mood is okay given the recent events. Still using trazodone prn for sleep.  He is worried about his wife, who is sick with ESRD.    He still smokes but he cut back and has nearly quit.  D/w pt.  I encouraged him.    DM2 per endo- I'll defer and he agrees.  Last eye exam was 09/2016.  No retinopathy per patient report.  No foot sx.  Hb A1c 6.2 at endo appointment.   B12 def per endo.   Lipids checked per endo.   Colonoscopy up to date, not due for repeat yet.   RLS controlled with requip w/o ADE.   CHF per cards.  His functional status is lower for the last year or longer, gradually getting worse.  No CP.  Compliant with meds.  Minimal, almost no, BLE edema.    Prev LUTS controlled as is.  On finasteride.  PSA pending.    PMH and SH reviewed  ROS: Per HPI unless specifically indicated in ROS section   Meds, vitals, and allergies reviewed.   GEN: nad, alert and oriented HEENT: mucous membranes moist NECK: supple w/o LA CV: rrr PULM: ctab, no inc wob ABD: soft, +bs EXT: no edema SKIN: no acute rash  Diabetic foot exam: Normal inspection No skin breakdown No calluses  Normal DP pulses Normal sensation to light touch and monofilament Nails normal

## 2016-10-26 NOTE — Patient Instructions (Signed)
Go to the lab on the way out.  We'll contact you with your lab report. Take care.  Glad to see you.  Update me as needed.   

## 2016-10-26 NOTE — Progress Notes (Signed)
Pre visit review using our clinic review tool, if applicable. No additional management support is needed unless otherwise documented below in the visit note. 

## 2016-10-27 ENCOUNTER — Telehealth: Payer: Self-pay | Admitting: *Deleted

## 2016-10-27 ENCOUNTER — Other Ambulatory Visit: Payer: Self-pay | Admitting: Family Medicine

## 2016-10-27 DIAGNOSIS — D649 Anemia, unspecified: Secondary | ICD-10-CM

## 2016-10-27 MED ORDER — ALBUTEROL SULFATE HFA 108 (90 BASE) MCG/ACT IN AERS: 2.0000 | INHALATION_SPRAY | Freq: Four times a day (QID) | RESPIRATORY_TRACT | 2 refills | Status: AC | PRN

## 2016-10-27 NOTE — Assessment & Plan Note (Signed)
Per endocrinology 

## 2016-10-27 NOTE — Telephone Encounter (Signed)
Sent. Thanks.   

## 2016-10-27 NOTE — Assessment & Plan Note (Signed)
PSA pending. Continue current medications. LUTS controlled. He agrees. >25 minutes spent in face to face time with patient, >50% spent in counselling or coordination of care.

## 2016-10-27 NOTE — Assessment & Plan Note (Signed)
Controlled. Continue as is. No adverse effect on medication.

## 2016-10-27 NOTE — Assessment & Plan Note (Signed)
Significant social upheaval. Able tolerate his current situation. No adverse effect on medication. Continue as is.

## 2016-10-27 NOTE — Assessment & Plan Note (Signed)
Per cardiology. Appears euvolemic. Continue work on diet and exercise.

## 2016-10-27 NOTE — Assessment & Plan Note (Signed)
Smoking less. Continue taper and then cessation of tobacco.

## 2016-10-27 NOTE — Assessment & Plan Note (Signed)
Continue trazodone. Controlled. No adverse effect.

## 2016-10-27 NOTE — Telephone Encounter (Signed)
Insurance prefers [employer] instead of [employer]. Ok to change? [place]

## 2016-10-29 ENCOUNTER — Telehealth: Payer: Self-pay

## 2016-10-29 NOTE — Telephone Encounter (Signed)
Form started on CMM. Says we should receive a response in the next 72 business hours

## 2016-11-01 ENCOUNTER — Other Ambulatory Visit: Payer: Medicare Other

## 2016-11-04 ENCOUNTER — Other Ambulatory Visit (INDEPENDENT_AMBULATORY_CARE_PROVIDER_SITE_OTHER): Payer: Medicare Other

## 2016-11-04 DIAGNOSIS — D649 Anemia, unspecified: Secondary | ICD-10-CM

## 2016-11-04 LAB — CBC WITH DIFFERENTIAL/PLATELET
BASOS ABS: 0.1 10*3/uL (ref 0.0–0.1)
Basophils Relative: 1.5 % (ref 0.0–3.0)
EOS ABS: 0.2 10*3/uL (ref 0.0–0.7)
Eosinophils Relative: 2.9 % (ref 0.0–5.0)
HCT: 33.4 % — ABNORMAL LOW (ref 39.0–52.0)
Hemoglobin: 9.8 g/dL — ABNORMAL LOW (ref 13.0–17.0)
LYMPHS ABS: 2.7 10*3/uL (ref 0.7–4.0)
Lymphocytes Relative: 32.3 % (ref 12.0–46.0)
MCHC: 29.4 g/dL — ABNORMAL LOW (ref 30.0–36.0)
MCV: 64.8 fl — ABNORMAL LOW (ref 78.0–100.0)
Monocytes Absolute: 0.6 10*3/uL (ref 0.1–1.0)
Monocytes Relative: 7.5 % (ref 3.0–12.0)
NEUTROS ABS: 4.7 10*3/uL (ref 1.4–7.7)
NEUTROS PCT: 55.8 % (ref 43.0–77.0)
PLATELETS: 367 10*3/uL (ref 150.0–400.0)
RBC: 5.16 Mil/uL (ref 4.22–5.81)
RDW: 20 % — AB (ref 11.5–15.5)
WBC: 8.4 10*3/uL (ref 4.0–10.5)

## 2016-11-04 LAB — IBC PANEL
IRON: 18 ug/dL — AB (ref 42–165)
Saturation Ratios: 3.3 % — ABNORMAL LOW (ref 20.0–50.0)
TRANSFERRIN: 386 mg/dL — AB (ref 212.0–360.0)

## 2016-11-07 ENCOUNTER — Other Ambulatory Visit: Payer: Self-pay | Admitting: Family Medicine

## 2016-11-07 DIAGNOSIS — D509 Iron deficiency anemia, unspecified: Secondary | ICD-10-CM | POA: Insufficient documentation

## 2016-11-09 ENCOUNTER — Other Ambulatory Visit: Payer: Self-pay | Admitting: Family Medicine

## 2016-11-09 DIAGNOSIS — D509 Iron deficiency anemia, unspecified: Secondary | ICD-10-CM

## 2016-11-09 MED ORDER — FERROUS SULFATE 325 (65 FE) MG PO TABS: 325.0000 mg | ORAL_TABLET | Freq: Every day | ORAL | 3 refills | Status: AC

## 2016-11-18 ENCOUNTER — Other Ambulatory Visit (INDEPENDENT_AMBULATORY_CARE_PROVIDER_SITE_OTHER): Payer: Medicare Other

## 2016-11-18 ENCOUNTER — Encounter: Payer: Self-pay | Admitting: Emergency Medicine

## 2016-11-18 ENCOUNTER — Emergency Department: Payer: Medicare Other

## 2016-11-18 ENCOUNTER — Inpatient Hospital Stay: Payer: Medicare Other

## 2016-11-18 ENCOUNTER — Inpatient Hospital Stay
Admission: EM | Admit: 2016-11-18 | Discharge: 2016-11-19 | DRG: 291 | Disposition: A | Discharge status: Home / Self Care | Payer: Medicare Other | Attending: Internal Medicine | Admitting: Internal Medicine

## 2016-11-18 DIAGNOSIS — G4730 Sleep apnea, unspecified: Secondary | ICD-10-CM | POA: Diagnosis present

## 2016-11-18 DIAGNOSIS — D509 Iron deficiency anemia, unspecified: Secondary | ICD-10-CM | POA: Diagnosis not present

## 2016-11-18 DIAGNOSIS — I200 Unstable angina: Secondary | ICD-10-CM

## 2016-11-18 DIAGNOSIS — E785 Hyperlipidemia, unspecified: Secondary | ICD-10-CM | POA: Diagnosis present

## 2016-11-18 DIAGNOSIS — I712 Thoracic aortic aneurysm, without rupture: Secondary | ICD-10-CM | POA: Diagnosis not present

## 2016-11-18 DIAGNOSIS — I110 Hypertensive heart disease with heart failure: Principal | ICD-10-CM | POA: Diagnosis present

## 2016-11-18 DIAGNOSIS — Z79899 Other long term (current) drug therapy: Secondary | ICD-10-CM

## 2016-11-18 DIAGNOSIS — Z720 Tobacco use: Secondary | ICD-10-CM | POA: Diagnosis not present

## 2016-11-18 DIAGNOSIS — R0789 Other chest pain: Secondary | ICD-10-CM | POA: Diagnosis not present

## 2016-11-18 DIAGNOSIS — Z9884 Bariatric surgery status: Secondary | ICD-10-CM

## 2016-11-18 DIAGNOSIS — I248 Other forms of acute ischemic heart disease: Secondary | ICD-10-CM | POA: Diagnosis not present

## 2016-11-18 DIAGNOSIS — J440 Chronic obstructive pulmonary disease with acute lower respiratory infection: Secondary | ICD-10-CM | POA: Diagnosis present

## 2016-11-18 DIAGNOSIS — F17210 Nicotine dependence, cigarettes, uncomplicated: Secondary | ICD-10-CM | POA: Diagnosis present

## 2016-11-18 DIAGNOSIS — R0602 Shortness of breath: Secondary | ICD-10-CM | POA: Diagnosis not present

## 2016-11-18 DIAGNOSIS — Z833 Family history of diabetes mellitus: Secondary | ICD-10-CM | POA: Diagnosis not present

## 2016-11-18 DIAGNOSIS — I428 Other cardiomyopathies: Secondary | ICD-10-CM

## 2016-11-18 DIAGNOSIS — I509 Heart failure, unspecified: Secondary | ICD-10-CM | POA: Diagnosis not present

## 2016-11-18 DIAGNOSIS — R918 Other nonspecific abnormal finding of lung field: Secondary | ICD-10-CM | POA: Diagnosis present

## 2016-11-18 DIAGNOSIS — J189 Pneumonia, unspecified organism: Secondary | ICD-10-CM | POA: Diagnosis present

## 2016-11-18 DIAGNOSIS — I25110 Atherosclerotic heart disease of native coronary artery with unstable angina pectoris: Secondary | ICD-10-CM | POA: Diagnosis present

## 2016-11-18 DIAGNOSIS — J309 Allergic rhinitis, unspecified: Secondary | ICD-10-CM | POA: Diagnosis present

## 2016-11-18 DIAGNOSIS — I714 Abdominal aortic aneurysm, without rupture: Secondary | ICD-10-CM | POA: Diagnosis not present

## 2016-11-18 DIAGNOSIS — F419 Anxiety disorder, unspecified: Secondary | ICD-10-CM | POA: Diagnosis present

## 2016-11-18 DIAGNOSIS — Z981 Arthrodesis status: Secondary | ICD-10-CM | POA: Diagnosis not present

## 2016-11-18 DIAGNOSIS — I5023 Acute on chronic systolic (congestive) heart failure: Secondary | ICD-10-CM

## 2016-11-18 DIAGNOSIS — I420 Dilated cardiomyopathy: Secondary | ICD-10-CM | POA: Diagnosis present

## 2016-11-18 DIAGNOSIS — R0609 Other forms of dyspnea: Secondary | ICD-10-CM

## 2016-11-18 DIAGNOSIS — R079 Chest pain, unspecified: Secondary | ICD-10-CM | POA: Diagnosis not present

## 2016-11-18 DIAGNOSIS — J441 Chronic obstructive pulmonary disease with (acute) exacerbation: Secondary | ICD-10-CM | POA: Diagnosis present

## 2016-11-18 DIAGNOSIS — Z7984 Long term (current) use of oral hypoglycemic drugs: Secondary | ICD-10-CM

## 2016-11-18 DIAGNOSIS — F329 Major depressive disorder, single episode, unspecified: Secondary | ICD-10-CM | POA: Diagnosis present

## 2016-11-18 DIAGNOSIS — N400 Enlarged prostate without lower urinary tract symptoms: Secondary | ICD-10-CM | POA: Diagnosis present

## 2016-11-18 DIAGNOSIS — Z9049 Acquired absence of other specified parts of digestive tract: Secondary | ICD-10-CM

## 2016-11-18 DIAGNOSIS — E119 Type 2 diabetes mellitus without complications: Secondary | ICD-10-CM | POA: Diagnosis present

## 2016-11-18 DIAGNOSIS — I427 Cardiomyopathy due to drug and external agent: Secondary | ICD-10-CM | POA: Diagnosis not present

## 2016-11-18 DIAGNOSIS — Z8249 Family history of ischemic heart disease and other diseases of the circulatory system: Secondary | ICD-10-CM

## 2016-11-18 DIAGNOSIS — I2510 Atherosclerotic heart disease of native coronary artery without angina pectoris: Secondary | ICD-10-CM

## 2016-11-18 LAB — CBC WITH DIFFERENTIAL/PLATELET
BASOS PCT: 1.6 % (ref 0.0–3.0)
Basophils Absolute: 0.1 10*3/uL (ref 0.0–0.1)
EOS PCT: 3.2 % (ref 0.0–5.0)
Eosinophils Absolute: 0.3 10*3/uL (ref 0.0–0.7)
HCT: 30.2 % — ABNORMAL LOW (ref 39.0–52.0)
HEMOGLOBIN: 9 g/dL — AB (ref 13.0–17.0)
LYMPHS PCT: 29.8 % (ref 12.0–46.0)
Lymphs Abs: 2.4 10*3/uL (ref 0.7–4.0)
MCHC: 29.7 g/dL — AB (ref 30.0–36.0)
MCV: 65 fl — AB (ref 78.0–100.0)
Monocytes Absolute: 0.6 10*3/uL (ref 0.1–1.0)
Monocytes Relative: 8 % (ref 3.0–12.0)
NEUTROS ABS: 4.6 10*3/uL (ref 1.4–7.7)
Neutrophils Relative %: 57.4 % (ref 43.0–77.0)
Platelets: 314 10*3/uL (ref 150.0–400.0)
RBC: 4.65 Mil/uL (ref 4.22–5.81)
RDW: 20.5 % — AB (ref 11.5–15.5)
WBC: 7.9 10*3/uL (ref 4.0–10.5)

## 2016-11-18 LAB — BASIC METABOLIC PANEL
Anion gap: 5 (ref 5–15)
BUN: 15 mg/dL (ref 6–20)
CHLORIDE: 106 mmol/L (ref 101–111)
CO2: 27 mmol/L (ref 22–32)
Calcium: 8.3 mg/dL — ABNORMAL LOW (ref 8.9–10.3)
Creatinine, Ser: 0.84 mg/dL (ref 0.61–1.24)
GFR calc Af Amer: >60 mL/min (ref >60)
GFR calc non Af Amer: >60 mL/min (ref >60)
GLUCOSE: 108 mg/dL — AB (ref 65–99)
POTASSIUM: 3.6 mmol/L (ref 3.5–5.1)
Sodium: 138 mmol/L (ref 135–145)

## 2016-11-18 LAB — CBC
HCT: 29.7 % — ABNORMAL LOW (ref 40.0–52.0)
Hemoglobin: 9.1 g/dL — ABNORMAL LOW (ref 13.0–18.0)
MCH: 19.6 pg — ABNORMAL LOW (ref 26.0–34.0)
MCHC: 30.6 g/dL — AB (ref 32.0–36.0)
MCV: 64.2 fL — AB (ref 80.0–100.0)
Platelets: 322 10*3/uL (ref 150–440)
RBC: 4.63 MIL/uL (ref 4.40–5.90)
RDW: 20.5 % — AB (ref 11.5–14.5)
WBC: 9.5 10*3/uL (ref 3.8–10.6)

## 2016-11-18 LAB — TROPONIN I
Troponin I: 0.04 ng/mL (ref <0.03)
Troponin I: 0.05 ng/mL (ref <0.03)

## 2016-11-18 LAB — BRAIN NATRIURETIC PEPTIDE: B Natriuretic Peptide: 600 pg/mL — ABNORMAL HIGH (ref 0.0–100.0)

## 2016-11-18 LAB — TSH: TSH: 1.396 u[IU]/mL (ref 0.350–4.500)

## 2016-11-18 MED ORDER — ALBUTEROL SULFATE (2.5 MG/3ML) 0.083% IN NEBU
2.5000 mg | INHALATION_SOLUTION | Freq: Four times a day (QID) | RESPIRATORY_TRACT | Status: DC
  Administered 2016-11-18 – 2016-11-19 (×3): 2.5 mg via RESPIRATORY_TRACT
  Filled 2016-11-18 (×2): qty 3

## 2016-11-18 MED ORDER — IPRATROPIUM BROMIDE 0.02 % IN SOLN
RESPIRATORY_TRACT | Status: AC
  Administered 2016-11-18: 0.5 mg via RESPIRATORY_TRACT
  Filled 2016-11-18: qty 2.5

## 2016-11-18 MED ORDER — ONDANSETRON HCL 4 MG/2ML IJ SOLN: 4.0000 mg | Freq: Four times a day (QID) | INTRAMUSCULAR | Status: DC | PRN

## 2016-11-18 MED ORDER — SODIUM CHLORIDE 0.9% FLUSH: 3.0000 mL | INTRAVENOUS | Status: DC | PRN

## 2016-11-18 MED ORDER — DEXTROSE 5 % IV SOLN
1.0000 g | Freq: Every day | INTRAVENOUS | Status: DC
  Administered 2016-11-18: 1 g via INTRAVENOUS
  Filled 2016-11-18 (×3): qty 10

## 2016-11-18 MED ORDER — SODIUM CHLORIDE 0.9% FLUSH
3.0000 mL | Freq: Two times a day (BID) | INTRAVENOUS | Status: DC
  Administered 2016-11-18 – 2016-11-19 (×2): 3 mL via INTRAVENOUS

## 2016-11-18 MED ORDER — ALPRAZOLAM 0.5 MG PO TABS: 0.5000 mg | ORAL_TABLET | Freq: Two times a day (BID) | ORAL | Status: DC | PRN

## 2016-11-18 MED ORDER — IPRATROPIUM BROMIDE 0.02 % IN SOLN
0.5000 mg | Freq: Four times a day (QID) | RESPIRATORY_TRACT | Status: DC
  Administered 2016-11-18 – 2016-11-19 (×3): 0.5 mg via RESPIRATORY_TRACT
  Filled 2016-11-18 (×2): qty 2.5

## 2016-11-18 MED ORDER — ALBUTEROL SULFATE (2.5 MG/3ML) 0.083% IN NEBU
INHALATION_SOLUTION | RESPIRATORY_TRACT | Status: AC
  Administered 2016-11-18: 2.5 mg via RESPIRATORY_TRACT
  Filled 2016-11-18: qty 3

## 2016-11-18 MED ORDER — ASPIRIN 81 MG PO CHEW
324.0000 mg | CHEWABLE_TABLET | Freq: Once | ORAL | Status: AC
  Administered 2016-11-18: 324 mg via ORAL
  Filled 2016-11-18: qty 4

## 2016-11-18 MED ORDER — POTASSIUM CHLORIDE ER 10 MEQ PO TBCR
30.0000 meq | EXTENDED_RELEASE_TABLET | Freq: Every day | ORAL | Status: DC
  Administered 2016-11-18 – 2016-11-19 (×2): 30 meq via ORAL
  Filled 2016-11-18 (×3): qty 3

## 2016-11-18 MED ORDER — METHYLPREDNISOLONE SODIUM SUCC 125 MG IJ SOLR
60.0000 mg | Freq: Four times a day (QID) | INTRAMUSCULAR | Status: DC
  Administered 2016-11-18 – 2016-11-19 (×3): 60 mg via INTRAVENOUS
  Filled 2016-11-18 (×3): qty 2

## 2016-11-18 MED ORDER — FLUTICASONE PROPIONATE 50 MCG/ACT NA SUSP
2.0000 | Freq: Every day | NASAL | Status: DC
  Administered 2016-11-18 – 2016-11-19 (×2): 2 via NASAL
  Filled 2016-11-18: qty 16

## 2016-11-18 MED ORDER — FINASTERIDE 5 MG PO TABS
5.0000 mg | ORAL_TABLET | Freq: Every day | ORAL | Status: DC
Start: 2016-11-18 — End: 2016-11-19
  Administered 2016-11-18 – 2016-11-19 (×2): 5 mg via ORAL
  Filled 2016-11-18 (×2): qty 1

## 2016-11-18 MED ORDER — CEFTRIAXONE SODIUM-DEXTROSE 1-3.74 GM-% IV SOLR: 1.0000 g | Freq: Once | INTRAVENOUS | Status: DC

## 2016-11-18 MED ORDER — TIOTROPIUM BROMIDE MONOHYDRATE 18 MCG IN CAPS
18.0000 ug | ORAL_CAPSULE | Freq: Every day | RESPIRATORY_TRACT | Status: DC
  Administered 2016-11-18 – 2016-11-19 (×2): 18 ug via RESPIRATORY_TRACT
  Filled 2016-11-18: qty 5

## 2016-11-18 MED ORDER — IOPAMIDOL (ISOVUE-370) INJECTION 76%
75.0000 mL | Freq: Once | INTRAVENOUS | Status: AC | PRN
  Administered 2016-11-18: 75 mL via INTRAVENOUS

## 2016-11-18 MED ORDER — DEXTROSE 5 % IV SOLN: 1.0000 g | INTRAVENOUS | Status: DC

## 2016-11-18 MED ORDER — DOXAZOSIN MESYLATE 2 MG PO TABS
2.0000 mg | ORAL_TABLET | Freq: Every day | ORAL | Status: DC
  Administered 2016-11-18 – 2016-11-19 (×2): 2 mg via ORAL
  Filled 2016-11-18 (×2): qty 1

## 2016-11-18 MED ORDER — SODIUM CHLORIDE 0.9 % IV SOLN: 250.0000 mL | INTRAVENOUS | Status: DC | PRN

## 2016-11-18 MED ORDER — ACETAMINOPHEN 325 MG PO TABS: 650.0000 mg | ORAL_TABLET | Freq: Four times a day (QID) | ORAL | Status: DC | PRN

## 2016-11-18 MED ORDER — ENOXAPARIN SODIUM 40 MG/0.4ML ~~LOC~~ SOLN
40.0000 mg | SUBCUTANEOUS | Status: DC
  Filled 2016-11-18: qty 0.4

## 2016-11-18 MED ORDER — ROPINIROLE HCL 1 MG PO TABS
1.0000 mg | ORAL_TABLET | Freq: Every day | ORAL | Status: DC
  Administered 2016-11-18: 1 mg via ORAL
  Filled 2016-11-18 (×2): qty 1

## 2016-11-18 MED ORDER — FERROUS SULFATE 325 (65 FE) MG PO TABS
325.0000 mg | ORAL_TABLET | Freq: Every day | ORAL | Status: DC
  Administered 2016-11-19: 325 mg via ORAL
  Filled 2016-11-18: qty 1

## 2016-11-18 MED ORDER — MELOXICAM 7.5 MG PO TABS
15.0000 mg | ORAL_TABLET | Freq: Every day | ORAL | Status: DC
  Administered 2016-11-18 – 2016-11-19 (×2): 15 mg via ORAL
  Filled 2016-11-18 (×2): qty 2
  Filled 2016-11-18: qty 1

## 2016-11-18 MED ORDER — GUAIFENESIN ER 600 MG PO TB12
600.0000 mg | ORAL_TABLET | Freq: Two times a day (BID) | ORAL | Status: DC
  Administered 2016-11-18 – 2016-11-19 (×2): 600 mg via ORAL
  Filled 2016-11-18 (×2): qty 1

## 2016-11-18 MED ORDER — DEXTROSE 5 % IV SOLN
500.0000 mg | INTRAVENOUS | Status: DC
  Administered 2016-11-18: 500 mg via INTRAVENOUS
  Filled 2016-11-18 (×2): qty 500

## 2016-11-18 MED ORDER — CARVEDILOL 6.25 MG PO TABS
6.2500 mg | ORAL_TABLET | Freq: Two times a day (BID) | ORAL | Status: DC
  Administered 2016-11-18 – 2016-11-19 (×2): 6.25 mg via ORAL
  Filled 2016-11-18 (×2): qty 1

## 2016-11-18 MED ORDER — SACUBITRIL-VALSARTAN 49-51 MG PO TABS
1.0000 | ORAL_TABLET | Freq: Two times a day (BID) | ORAL | Status: DC
  Administered 2016-11-18 – 2016-11-19 (×2): 1 via ORAL
  Filled 2016-11-18 (×3): qty 1

## 2016-11-18 MED ORDER — SODIUM CHLORIDE 0.9% FLUSH: 3.0000 mL | Freq: Two times a day (BID) | INTRAVENOUS | Status: DC

## 2016-11-18 MED ORDER — GUAIFENESIN ER 600 MG PO TB12: 600.0000 mg | ORAL_TABLET | Freq: Two times a day (BID) | ORAL | Status: DC

## 2016-11-18 MED ORDER — ACETAMINOPHEN 650 MG RE SUPP: 650.0000 mg | Freq: Four times a day (QID) | RECTAL | Status: DC | PRN

## 2016-11-18 MED ORDER — CYCLOBENZAPRINE HCL 10 MG PO TABS: 10.0000 mg | ORAL_TABLET | Freq: Three times a day (TID) | ORAL | Status: DC | PRN

## 2016-11-18 MED ORDER — FLUOXETINE HCL 20 MG PO CAPS
40.0000 mg | ORAL_CAPSULE | Freq: Every day | ORAL | Status: DC
  Administered 2016-11-18 – 2016-11-19 (×2): 40 mg via ORAL
  Filled 2016-11-18: qty 1
  Filled 2016-11-18 (×2): qty 2

## 2016-11-18 MED ORDER — ONDANSETRON HCL 4 MG PO TABS: 4.0000 mg | ORAL_TABLET | Freq: Four times a day (QID) | ORAL | Status: DC | PRN

## 2016-11-18 MED ORDER — TRAZODONE HCL 50 MG PO TABS
25.0000 mg | ORAL_TABLET | Freq: Every evening | ORAL | Status: DC | PRN
  Administered 2016-11-18: 50 mg via ORAL
  Filled 2016-11-18: qty 1

## 2016-11-18 MED ORDER — MOMETASONE FURO-FORMOTEROL FUM 200-5 MCG/ACT IN AERO
2.0000 | INHALATION_SPRAY | Freq: Two times a day (BID) | RESPIRATORY_TRACT | Status: DC
  Administered 2016-11-18 – 2016-11-19 (×2): 2 via RESPIRATORY_TRACT
  Filled 2016-11-18: qty 8.8

## 2016-11-18 MED ORDER — FUROSEMIDE 10 MG/ML IJ SOLN: 20.0000 mg | Freq: Two times a day (BID) | INTRAMUSCULAR | Status: DC

## 2016-11-18 MED ORDER — FUROSEMIDE 10 MG/ML IJ SOLN
20.0000 mg | Freq: Once | INTRAMUSCULAR | Status: AC
  Administered 2016-11-18: 20 mg via INTRAVENOUS
  Filled 2016-11-18: qty 4

## 2016-11-18 MED ORDER — SIMVASTATIN 10 MG PO TABS
20.0000 mg | ORAL_TABLET | Freq: Every day | ORAL | Status: DC
  Filled 2016-11-18: qty 2

## 2016-11-18 MED ORDER — METFORMIN HCL 500 MG PO TABS: 1000.0000 mg | ORAL_TABLET | Freq: Two times a day (BID) | ORAL | Status: DC

## 2016-11-18 NOTE — ED Triage Notes (Signed)
Pt in via POV with complaints of increasing congestion, shortness of breath and intermittent chest pain on exertion x approximately 2 weeks.  Pt with lab work today, PCP called pt to advise to come to ER due to low hemoglobin.  NAD noted at this time.

## 2016-11-18 NOTE — ED Provider Notes (Signed)
[facility] Emergency Department Provider Note ____________________________________________   First MD Initiated Contact with Patient 11/18/16 1833     (approximate)  I have reviewed the triage vital signs and the nursing notes.  HISTORY  Chief Complaint Shortness of Breath   HPI Mark Morris is a 66 y.o. male here for evaluation of shortness of breath and chest pain  For about the last 2 weeks patient is knows he is very short of breath with walking, but accompanied by a sense of heaviness in his chest. It is getting worse daily to the point now he can't hardly walk without excreting chest pain and shortness of breath. He takes a baby aspirin daily, currently reports no chest pain but feels slightly short of breath. Reports he would've chest pain if he were to start walking again.  No nausea vomiting. Does have a history of heart disease  Denies abdominal pain.   Past Medical History:  Diagnosis Date  . Allergy 2002   allergic rhinitis  . Anxiety 08/24/1999  . Arthritis   . BPH (benign prostatic hyperplasia)   . CAD (coronary artery disease)    Nonobstructive by cardiac catheterization 5/04: Proximal RCA 25%, EF 65%;  Myoview 12/05: no scar or ischemia, EF 58%  . COPD (chronic obstructive pulmonary disease) ([place])   . DDD (degenerative disc disease), lumbosacral   . Depression 08/24/1999  . Diabetes mellitus 08/24/2003   typeII  . Dysrhythmia 2013   h/o SVT   . Enteritis 10/18/2004   CT abd. Enteritis//CT pelvis Sacroiliac Spurring  . Hearing difficulty of both ears   . HNP (herniated nucleus pulposus), lumbar   . Hyperlipidemia 08/23/1997  . Hypertension 1980  . Paroxysmal SVT (supraventricular tachycardia) (HCC)    History of  . Post-operative infection 2006   after gastric bypass for weight loss  . Shortness of breath    exertional  . Sigmoid polyp 07/03/2007   colonoscopy Small internal hemorrhoids; Sigmoid polyp B9 (Dr.  Elliott)//colonoscopy normal 04/06/2004  . Sleep apnea    does not use C-pap ; study done >10 years ago  . Tobacco abuse     Patient Active Problem List   Diagnosis Date Noted  . SOB (shortness of breath) 11/18/2016  . Iron deficiency anemia 11/07/2016  . Congestive dilated cardiomyopathy ([place]) 04/08/2016  . Shortness of breath 04/08/2016  . Advance care planning 07/05/2014  . Lumbar spondylosis with myelopathy 04/12/2014  . Sinus tachycardia 10/22/2013  . Chronic fatigue 02/26/2013  . Medicare annual wellness visit, subsequent 12/25/2012  . Transaminitis 12/25/2012  . COPD (chronic obstructive pulmonary disease) ([place]) 06/27/2012  . CAD (coronary artery disease)   . Tobacco abuse   . ED (erectile dysfunction) 11/12/2011  . DYSPHAGIA UNSPECIFIED 10/01/2010  . BACK PAIN, LUMBAR 03/31/2009  . HEMORRHOIDS, INTERNAL, WITH BLEEDING 08/08/2008  . SEBACEOUS CYST 07/24/2008  . HEARING LOSS, UNSPEC. 05/07/2008  . BPH (benign prostatic hyperplasia) 05/07/2008  . RESTLESS LEGS SYNDROME 03/05/2008  . URINARY INCONTINENCE, URGE 06/07/2007  . Essential hypertension 12/09/2006  . PAROXYSMAL SUPRAVENTRICULAR TACHYCARDIA 12/09/2006  . Allergic rhinitis 12/09/2006  . Insomnia 12/09/2006  . SLEEP APNEA 12/09/2006  . Diabetes mellitus without complication ([place]) 02/40/9735  . Anxiety state 08/24/1999  . DEPRESSION 08/24/1999  . Hyperlipidemia 08/23/1997    Past Surgical History:  Procedure Laterality Date  . ANKLE FRACTURE SURGERY  02/2003  . APPENDECTOMY    . BACK SURGERY  2010  . CARDIAC CATHETERIZATION  01/14/2003   Min plaque EF65%//  Cath normal (Dr. Clayborn Bigness 2000)//ECHO Mild AAA TrMR Ef61% 05/30/2002 (Dr.Callwood)  . CARDIAC CATHETERIZATION  2015   [facility]  . CARPAL TUNNEL RELEASE Bilateral   . CHOLECYSTECTOMY    . COLONOSCOPY    . GASTRIC BYPASS  11/24/2004  . GASTRIC FUNDOPLICATION  9323   [facility]  . HERNIA REPAIR  01/02/2006   right ventral Hernia Repair Laparascopic  [place] P[place]  . KNEE ARTHROSCOPY W/ MENISCAL REPAIR Right   . MAXIMUM ACCESS (MAS)POSTERIOR LUMBAR INTERBODY FUSION (PLIF) 2 LEVEL N/A 04/12/2014   Procedure: Lumbar Four-Five Lumbar Five-Sacral One Redo Laminectomy and Posterior Lumbar Interbody Fusion;  Surgeon: Erline Levine, MD;  Location: [place] NEURO ORS;  Service: Neurosurgery;  Laterality: N/A;  Lumbar Four-Five Lumbar Five-Sacral One Redo Laminectomy and Posterior Lumbar Interbody Fusion  . sleep study  03/2004   Sleep Study Hypopneas 60/hr ; Kicks 160/hr    Prior to Admission medications   Medication Sig Start Date End Date Taking? Authorizing Provider  albuterol (PROVENTIL HFA;VENTOLIN HFA) 108 (90 Base) MCG/ACT inhaler Inhale 2 puffs into the lungs every 6 (six) hours as needed for wheezing or shortness of breath (fill with proair inhaler). 10/27/16  Yes Tonia Ghent, MD  ALPRAZolam Duanne Moron) 0.5 MG tablet TAKE ONE TABLET BY MOUTH TWICE DAILY AS NEEDED 07/22/16  Yes Tonia Ghent, MD  Blood Glucose Monitoring Suppl (ONE TOUCH ULTRA SYSTEM KIT) W/DEVICE KIT Check blood sugar once daily and as directed. Ex E11.9 07/05/14  Yes Tonia Ghent, MD  budesonide-formoterol [facility]) 160-4.5 MCG/ACT inhaler Inhale 1 puff into the lungs 2 (two) times daily. Rinse after use 10/26/16  Yes Tonia Ghent, MD  calcium elemental as carbonate (CALCIUM ANTACID ULTRA) 400 MG tablet Chew 1,000 mg by mouth daily.     Yes Historical Provider, MD  carvedilol (COREG) 6.25 MG tablet Take 1 tablet (6.25 mg total) by mouth 2 (two) times daily. 04/08/16  Yes Minna Merritts, MD  cyanocobalamin (,VITAMIN B-12,) 1000 MCG/ML injection Inject 1,000 mcg into the muscle every 14 (fourteen) days. 09/07/16  Yes Historical Provider, MD  cyclobenzaprine (FLEXERIL) 10 MG tablet TAKE ONE-HALF TO ONE TABLET BY MOUTH TWICE DAILY AS NEEDED FOR MUSCLE SPASM (CAUSES SEDATION) 07/22/16  Yes Tonia Ghent, MD  doxazosin (CARDURA) 2 MG tablet TAKE ONE TABLET BY MOUTH ONCE DAILY 08/31/16  Yes  Tonia Ghent, MD  ferrous sulfate 325 (65 FE) MG tablet Take 1 tablet (325 mg total) by mouth daily with breakfast. 11/09/16  Yes Tonia Ghent, MD  finasteride (PROSCAR) 5 MG tablet Take 1 tablet (5 mg total) by mouth daily. 10/26/16  Yes Tonia Ghent, MD  FLUoxetine (PROZAC) 40 MG capsule Take 1 capsule (40 mg total) by mouth daily. 10/26/16  Yes Tonia Ghent, MD  fluticasone (FLONASE) 50 MCG/ACT nasal spray Place 2 sprays into both nostrils daily. 08/22/15  Yes Tonia Ghent, MD  glucose blood (ONE TOUCH ULTRA TEST) test strip Use as instructed to test blood sugar 1-2 times daily.  Diagnosis:E11.9.  History of high and low sugars, with need for mult testing per day 02/27/15  Yes Tonia Ghent, MD  guaiFENesin (MUCINEX) 600 MG 12 hr tablet Take 1 tablet (600 mg total) by mouth 2 (two) times daily. 02/27/15  Yes Tonia Ghent, MD  meloxicam (MOBIC) 15 MG tablet TAKE ONE TABLET BY MOUTH ONCE DAILY WITH FOOD 10/08/16  Yes Tonia Ghent, MD  metFORMIN (GLUCOPHAGE) 500 MG tablet Take 2 tablets (1,000 mg total)  by mouth 2 (two) times daily with a meal. 10/26/16  Yes Tonia Ghent, MD  Multiple Vitamin (MULTIVITAMIN) tablet Take 1 tablet by mouth daily.     Yes Historical Provider, MD  ONE TOUCH ULTRA TEST test strip USE STRIP AS INSTRUCTED TO TEST BLOOD SUGAR ONCE DAILY. 04/23/16  Yes Tonia Ghent, MD  [facility] DELICA LANCETS FINE MISC check blood sugar once daily and as directed.E11.9 07/05/14  Yes Tonia Ghent, MD  potassium chloride (K-DUR) 10 MEQ tablet Take 3 tablets (30 mEq total) by mouth daily. 10/26/16  Yes Tonia Ghent, MD  rOPINIRole (REQUIP) 1 MG tablet Take 1 tablet (1 mg total) by mouth at bedtime. 10/26/16  Yes Tonia Ghent, MD  sacubitril-valsartan (ENTRESTO) 49-51 MG Take 1 tablet by mouth 2 (two) times daily. 07/06/16  Yes Minna Merritts, MD  simvastatin (ZOCOR) 20 MG tablet TAKE ONE TABLET BY MOUTH ONCE DAILY 08/22/15  Yes Tonia Ghent, MD  tiotropium (SPIRIVA  HANDIHALER) 18 MCG inhalation capsule Place 1 capsule (18 mcg total) into inhaler and inhale daily. 10/26/16  Yes Tonia Ghent, MD  traZODone (DESYREL) 50 MG tablet TAKE ONE-HALF TO ONE TABLET BY MOUTH AT BEDTIME AS NEEDED FOR SLEEP 10/26/16  Yes Tonia Ghent, MD    Allergies Codeine and Sulfonamide derivatives  Family History  Problem Relation Age of Onset  . Heart disease Mother 70    CABG DM; Died due to Staph infection of sterotomy incision  . Diabetes Mother   . Hypertension Father   . Heart disease Father 69    pneumonia/CHF  . Alcohol abuse Brother 51    Has stopped at present  . Diabetes Brother   . Alcohol abuse Brother   . Diabetes Brother   . Colon cancer Neg Hx   . Prostate cancer Neg Hx     Social History Social History  Substance Use Topics  . Smoking status: Current Every Day Smoker    Packs/day: 0.50    Years: 40.00    Types: Cigarettes  . Smokeless tobacco: Never Used     Comment: cutting down as of 06/2014, down to 1/2 PPD or less.   . Alcohol use No    Review of Systems Constitutional: No fever/chills Eyes: No visual changes. ENT: No sore throat. Cardiovascular: See history of present illness. Respiratory: See history of present illness Gastrointestinal: No abdominal pain.  No nausea, no vomiting.  No diarrhea.  No constipation. Genitourinary: Negative for dysuria. Musculoskeletal: Negative for back pain. Skin: Negative for rash. Neurological: Negative for headaches, focal weakness or numbness.  10-point ROS otherwise negative.  ____________________________________________   PHYSICAL EXAM:  VITAL SIGNS: ED Triage Vitals  Enc Vitals Group     BP 11/18/16 1739 126/74     Pulse Rate 11/18/16 1739 (!) 106     Resp 11/18/16 1739 (!) 22     Temp 11/18/16 1739 97.7 F (36.5 C)     Temp Source 11/18/16 1739 Oral     SpO2 11/18/16 1738 92 %     Weight 11/18/16 1739 220 lb (99.8 kg)     Height 11/18/16 1739 5' 10"  (1.778 m)     Head  Circumference --      Peak Flow --      Pain Score --      Pain Loc --      Pain Edu? --      Excl. in [place]? --     Constitutional: Alert  and oriented. Well appearing and in no acute distress. Eyes: Conjunctivae are normal. PERRL. EOMI. Head: Atraumatic. Nose: No congestion/rhinnorhea. Mouth/Throat: Mucous membranes are moist.  Oropharynx non-erythematous. Neck: No stridor.   Cardiovascular: Normal rate, regular rhythm. Grossly normal heart sounds.  Good peripheral circulation. Respiratory: Normal respiratory effort.  No retractions. The bases bilaterally demonstrate slight crackles versus very fine wheezes. Gastrointestinal: Soft and nontender. No distention. Musculoskeletal: No lower extremity tenderness nor edema.   Neurologic:  Normal speech and language. No gross focal neurologic deficits are appreciated. Skin:  Skin is warm, dry and intact. No rash noted. Psychiatric: Mood and affect are normal. Speech and behavior are normal.  ____________________________________________   LABS (all labs ordered are listed, but only abnormal results are displayed)  Labs Reviewed  BASIC METABOLIC PANEL - Abnormal; Notable for the following:       Result Value   Glucose, Bld 108 (*)    Calcium 8.3 (*)    All other components within normal limits  CBC - Abnormal; Notable for the following:    Hemoglobin 9.1 (*)    HCT 29.7 (*)    MCV 64.2 (*)    MCH 19.6 (*)    MCHC 30.6 (*)    RDW 20.5 (*)    All other components within normal limits  TROPONIN I - Abnormal; Notable for the following:    Troponin I 0.04 (*)    All other components within normal limits  BRAIN NATRIURETIC PEPTIDE   ____________________________________________  EKG  Reviewed and interpreted by me at 1723 Ventricular rate 110 QRS 1:30 QTc 480 Normal sinus rhythm, probable left ventricular hypertrophy with slight repolarization abnormality noted. Compared to the previous EKG from 07/27/2016, ST  elevation/repolarization abnormality is less apparent today ____________________________________________  RADIOLOGY  IMPRESSION: Enlargement of cardiac silhouette.  Bronchitic and new diffuse interstitial infiltrates and tiny pleural effusions, could represent pulmonary edema or atypical infection though interval development of interstitial lung disease is not excluded.   Electronically Signed   By: Lavonia Dana M.D.   On: 11/18/2016 18:35 ____________________________________________   PROCEDURES  Procedure(s) performed: None  Procedures  Critical Care performed: No  ____________________________________________   INITIAL IMPRESSION / ASSESSMENT AND PLAN / ED COURSE  Pertinent labs & imaging results that were available during my care of the patient were reviewed by me and considered in my medical decision making (see chart for details).  Patientevaluation of exertional chest pain and, primarily exertional dyspnea. Multiple risk factors with some known coronary artery disease. He is asymptomatic with relation to chest pain at present, slightly elevated troponin without acute EKG changes. Differential diagnosis is broad, based on clinical history I suspect this may be secondary to congestive heart failure, potentially an acute coronary syndrome or anginal picture, and felt less likely etiology such as pulmonary embolism, pneumothorax, aortic dissection. Does not appear overtly from a reactive airway picture. Does carry a history of COPD however  Discussed with the patient and based on his elevated heart score, patient will be admitted for further workup of the hospitalist service. I do believe this is most likely related to mild CHF, and thus have given him a small dose of Lasix as we continue his workup as an inpatient.      ____________________________________________   FINAL CLINICAL IMPRESSION(S) / ED DIAGNOSES  Final diagnoses:  Exertional dyspnea  Unstable chest  pain due to insufficient blood supply to heart (HCC)      NEW MEDICATIONS STARTED DURING THIS VISIT:  New Prescriptions  No medications on file     Note:  This document was prepared using Dragon voice recognition software and may include unintentional dictation errors.     Delman Kitten, MD 11/19/16 [phone]

## 2016-11-18 NOTE — ED Notes (Signed)
Dr. Jacqualine Code notified of elevated troponin of 0.04.

## 2016-11-18 NOTE — ED Notes (Signed)
Admitting Provider at bedside. 

## 2016-11-18 NOTE — H&P (Addendum)
[facility] NAME: Mark Morris    MR#:  [id number]  DATE OF BIRTH:  26-Jun-1951  DATE OF ADMISSION:  11/18/2016  PRIMARY CARE PHYSICIAN: Elsie Stain, MD   REQUESTING/REFERRING PHYSICIAN: Delman Kitten MD  CHIEF COMPLAINT:   Chief Complaint  Patient presents with  . Shortness of Breath    HISTORY OF PRESENT ILLNESS: Mark Morris  is a 66 y.o. male with a known history of  Anxiety, BPH, nonobstructive coronary artery disease, COPD, BPH, diabetes type 2 and systolic dysfunction presenting to the emergency room with complaint of shortness of breath is progressively gotten worse over past 2-3 weeks. He reports that he's had dry cough associated with wheezing but no fever or chills. He also complains of chest pressure with exertion. Denies any nocturnal dyspnea or orthopnea. Denies any significant swelling of the lower extremity.  PAST MEDICAL HISTORY:   Past Medical History:  Diagnosis Date  . Allergy 2002   allergic rhinitis  . Anxiety 08/24/1999  . Arthritis   . BPH (benign prostatic hyperplasia)   . CAD (coronary artery disease)    Nonobstructive by cardiac catheterization 5/04: Proximal RCA 25%, EF 65%;  Myoview 12/05: no scar or ischemia, EF 58%  . COPD (chronic obstructive pulmonary disease) ([place])   . DDD (degenerative disc disease), lumbosacral   . Depression 08/24/1999  . Diabetes mellitus 08/24/2003   typeII  . Dysrhythmia 2013   h/o SVT   . Enteritis 10/18/2004   CT abd. Enteritis//CT pelvis Sacroiliac Spurring  . Hearing difficulty of both ears   . HNP (herniated nucleus pulposus), lumbar   . Hyperlipidemia 08/23/1997  . Hypertension 1980  . Paroxysmal SVT (supraventricular tachycardia) ([facility])    History of  . Post-operative infection 2006   after gastric bypass for weight loss  . Shortness of breath    exertional  . Sigmoid polyp 07/03/2007   colonoscopy Small internal hemorrhoids; Sigmoid polyp B9 (Dr.  Elliott)//colonoscopy normal 04/06/2004  . Sleep apnea    does not use C-pap ; study done >10 years ago  . Tobacco abuse     PAST SURGICAL HISTORY: Past Surgical History:  Procedure Laterality Date  . ANKLE FRACTURE SURGERY  02/2003  . APPENDECTOMY    . BACK SURGERY  2010  . CARDIAC CATHETERIZATION  01/14/2003   Min plaque EF65%// Cath normal (Dr. Clayborn Bigness 2000)//ECHO Mild AAA TrMR Ef61% 05/30/2002 (Dr.Callwood)  . CARDIAC CATHETERIZATION  2015   [facility]  . CARPAL TUNNEL RELEASE Bilateral   . CHOLECYSTECTOMY    . COLONOSCOPY    . GASTRIC BYPASS  11/24/2004  . GASTRIC FUNDOPLICATION  6599   [facility]  . HERNIA REPAIR  01/02/2006   right ventral Hernia Repair Laparascopic Lawana Pai  . KNEE ARTHROSCOPY W/ MENISCAL REPAIR Right   . MAXIMUM ACCESS (MAS)POSTERIOR LUMBAR INTERBODY FUSION (PLIF) 2 LEVEL N/A 04/12/2014   Procedure: Lumbar Four-Five Lumbar Five-Sacral One Redo Laminectomy and Posterior Lumbar Interbody Fusion;  Surgeon: Erline Levine, MD;  Location: [place] [facility];  Service: Neurosurgery;  Laterality: N/A;  Lumbar Four-Five Lumbar Five-Sacral One Redo Laminectomy and Posterior Lumbar Interbody Fusion  . sleep study  03/2004   Sleep Study Hypopneas 60/hr ; Kicks 160/hr    SOCIAL HISTORY:  Social History  Substance Use Topics  . Smoking status: Current Every Day Smoker    Packs/day: 0.50    Years: 40.00    Types: Cigarettes  . Smokeless tobacco: Never Used  Comment: cutting down as of 06/2014, down to 1/2 PPD or less.   . Alcohol use No    FAMILY HISTORY:  Family History  Problem Relation Age of Onset  . Heart disease Mother 28    CABG DM; Died due to Staph infection of sterotomy incision  . Diabetes Mother   . Hypertension Father   . Heart disease Father 10    pneumonia/CHF  . Alcohol abuse Brother 51    Has stopped at present  . Diabetes Brother   . Alcohol abuse Brother   . Diabetes Brother   . Colon cancer Neg Hx   . Prostate cancer Neg Hx     DRUG  ALLERGIES:  Allergies  Allergen Reactions  . Codeine     nausea  . Sulfonamide Derivatives     nausea    REVIEW OF SYSTEMS:   CONSTITUTIONAL: No fever, fatigue or weakness.  EYES: No blurred or double vision.  EARS, NOSE, AND THROAT: No tinnitus or ear pain.  RESPIRATORY: dry cough,  + shortness of breath, + wheezing orno  hemoptysis.  CARDIOVASCULAR: No chest pain, orthopnea, edema.  GASTROINTESTINAL: No nausea, vomiting, diarrhea or abdominal pain.  GENITOURINARY: No dysuria, hematuria.  ENDOCRINE: No polyuria, nocturia,  HEMATOLOGY: No anemia, easy bruising or bleeding SKIN: No rash or lesion. MUSCULOSKELETAL: No joint pain or arthritis.   NEUROLOGIC: No tingling, numbness, weakness.  PSYCHIATRY: No anxiety or depression.   MEDICATIONS AT HOME:  Prior to Admission medications   Medication Sig Start Date End Date Taking? Authorizing Provider  albuterol (PROVENTIL HFA;VENTOLIN HFA) 108 (90 Base) MCG/ACT inhaler Inhale 2 puffs into the lungs every 6 (six) hours as needed for wheezing or shortness of breath (fill with proair inhaler). 10/27/16   Tonia Ghent, MD  ALPRAZolam Duanne Moron) 0.5 MG tablet TAKE ONE TABLET BY MOUTH TWICE DAILY AS NEEDED 07/22/16   Tonia Ghent, MD  Blood Glucose Monitoring Suppl (ONE TOUCH ULTRA SYSTEM KIT) W/DEVICE KIT Check blood sugar once daily and as directed. Ex E11.9 07/05/14   Tonia Ghent, MD  budesonide-formoterol [facility]) 160-4.5 MCG/ACT inhaler Inhale 1 puff into the lungs 2 (two) times daily. Rinse after use 10/26/16   Tonia Ghent, MD  calcium elemental as carbonate (CALCIUM ANTACID ULTRA) 400 MG tablet Chew 1,000 mg by mouth daily.      Historical Provider, MD  carvedilol (COREG) 6.25 MG tablet Take 1 tablet (6.25 mg total) by mouth 2 (two) times daily. 04/08/16   Minna Merritts, MD  cyclobenzaprine (FLEXERIL) 10 MG tablet TAKE ONE-HALF TO ONE TABLET BY MOUTH TWICE DAILY AS NEEDED FOR MUSCLE SPASM (CAUSES SEDATION) 07/22/16   Tonia Ghent, MD  doxazosin (CARDURA) 2 MG tablet TAKE ONE TABLET BY MOUTH ONCE DAILY 08/31/16   Tonia Ghent, MD  ferrous sulfate 325 (65 FE) MG tablet Take 1 tablet (325 mg total) by mouth daily with breakfast. 11/09/16   Tonia Ghent, MD  finasteride (PROSCAR) 5 MG tablet Take 1 tablet (5 mg total) by mouth daily. 10/26/16   Tonia Ghent, MD  FLUoxetine (PROZAC) 40 MG capsule Take 1 capsule (40 mg total) by mouth daily. 10/26/16   Tonia Ghent, MD  fluticasone (FLONASE) 50 MCG/ACT nasal spray Place 2 sprays into both nostrils daily. 08/22/15   Tonia Ghent, MD  glucose blood (ONE TOUCH ULTRA TEST) test strip Use as instructed to test blood sugar 1-2 times daily.  Diagnosis:E11.9.  History of high and low  sugars, with need for mult testing per day 02/27/15   Tonia Ghent, MD  guaiFENesin (MUCINEX) 600 MG 12 hr tablet Take 1 tablet (600 mg total) by mouth 2 (two) times daily. 02/27/15   Tonia Ghent, MD  meloxicam (MOBIC) 15 MG tablet TAKE ONE TABLET BY MOUTH ONCE DAILY WITH FOOD 10/08/16   Tonia Ghent, MD  metFORMIN (GLUCOPHAGE) 500 MG tablet Take 2 tablets (1,000 mg total) by mouth 2 (two) times daily with a meal. 10/26/16   Tonia Ghent, MD  Multiple Vitamin (MULTIVITAMIN) tablet Take 1 tablet by mouth daily.      Historical Provider, MD  ONE TOUCH ULTRA TEST test strip USE STRIP AS INSTRUCTED TO TEST BLOOD SUGAR ONCE DAILY. 04/23/16   Tonia Ghent, MD  [facility] DELICA LANCETS FINE MISC check blood sugar once daily and as directed.E11.9 07/05/14   Tonia Ghent, MD  potassium chloride (K-DUR) 10 MEQ tablet Take 3 tablets (30 mEq total) by mouth daily. 10/26/16   Tonia Ghent, MD  rOPINIRole (REQUIP) 1 MG tablet Take 1 tablet (1 mg total) by mouth at bedtime. 10/26/16   Tonia Ghent, MD  sacubitril-valsartan (ENTRESTO) 49-51 MG Take 1 tablet by mouth 2 (two) times daily. 07/06/16   Minna Merritts, MD  simvastatin (ZOCOR) 20 MG tablet TAKE ONE TABLET BY MOUTH ONCE DAILY 08/22/15    Tonia Ghent, MD  tiotropium (SPIRIVA HANDIHALER) 18 MCG inhalation capsule Place 1 capsule (18 mcg total) into inhaler and inhale daily. 10/26/16   Tonia Ghent, MD  traZODone (DESYREL) 50 MG tablet TAKE ONE-HALF TO ONE TABLET BY MOUTH AT BEDTIME AS NEEDED FOR SLEEP 10/26/16   Tonia Ghent, MD      PHYSICAL EXAMINATION:   VITAL SIGNS: Blood pressure 126/74, pulse (!) 102, temperature 97.7 F (36.5 C), temperature source Oral, resp. rate 19, height _0  (1.778 m), weight 220 lb (99.8 kg), SpO2 95 %.  GENERAL:  66 y.o.-year-old patient lying in the bed with no acute distress.  EYES: Pupils equal, round, reactive to light and accommodation. No scleral icterus. Extraocular muscles intact.  HEENT: Head atraumatic, normocephalic. Oropharynx and nasopharynx clear.  NECK:  Supple, no jugular venous distention. No thyroid enlargement, no tenderness.  LUNGS: Bilateral wheezing throughout both lungs no sensory muscle usage CARDIOVASCULAR: S1, S2 normal. No murmurs, rubs, or gallops.  ABDOMEN: Soft, nontender, nondistended. Bowel sounds present. No organomegaly or mass.  EXTREMITIES: No pedal edema, cyanosis, or clubbing.  NEUROLOGIC: Cranial nerves II through XII are intact. Muscle strength 5/5 in all extremities. Sensation intact. Gait not checked.  PSYCHIATRIC: The patient is alert and oriented x 3.  SKIN: No obvious rash, lesion, or ulcer.   LABORATORY PANEL:   CBC  Recent Labs Lab 11/18/16 0900 11/18/16 1742  WBC 7.9 9.5  HGB 9.0* 9.1*  HCT 30.2* 29.7*  PLT 314.0 322  MCV 65.0* 64.2*  MCH  --  19.6*  MCHC 29.7* 30.6*  RDW 20.5* 20.5*  LYMPHSABS 2.4  --   MONOABS 0.6  --   EOSABS 0.3  --   BASOSABS 0.1  --    ------------------------------------------------------------------------------------------------------------------  Chemistries   Recent Labs Lab 11/18/16 1742  NA 138  K 3.6  CL 106  CO2 27  GLUCOSE 108*  BUN 15  CREATININE 0.84  CALCIUM 8.3*    ------------------------------------------------------------------------------------------------------------------ estimated creatinine clearance is 106.6 mL/min (by C-G formula based on SCr of 0.84 mg/dL). ------------------------------------------------------------------------------------------------------------------ No results for input(s):  TSH, T4TOTAL, T3FREE, THYROIDAB in the last 72 hours.  Invalid input(s): FREET3   Coagulation profile No results for input(s): INR, PROTIME in the last 168 hours. ------------------------------------------------------------------------------------------------------------------- No results for input(s): DDIMER in the last 72 hours. -------------------------------------------------------------------------------------------------------------------  Cardiac Enzymes  Recent Labs Lab 11/18/16 1742  TROPONINI 0.04*   ------------------------------------------------------------------------------------------------------------------ Invalid input(s): POCBNP  ---------------------------------------------------------------------------------------------------------------  Urinalysis    Component Value Date/Time   COLORURINE yellow 04/30/2008 1505   APPEARANCEUR Clear 04/30/2008 1505   LABSPEC 1.020 04/30/2008 1505   PHURINE 5.0 04/30/2008 1505   HGBUR negative 04/30/2008 1505   BILIRUBINUR negative 04/30/2008 1505   UROBILINOGEN 0.2 04/30/2008 1505   NITRITE negative 04/30/2008 1505     RADIOLOGY: Dg Chest 2 View  Result Date: 11/18/2016 CLINICAL DATA:  Increasing congestion, shortness of breath and intermittent chest pain with exertion for approximately 2 weeks EXAM: CHEST  2 VIEW COMPARISON:  07/25/2013 FINDINGS: Minimal enlargement of cardiac silhouette. Mediastinal contours and pulmonary vascularity normal. Bronchitic changes with diffuse interstitial infiltrates/thickening in both lungs new since 2014, could represent atypical infection,  edema, or interval development of interstitial lung disease. No segmental consolidation or pneumothorax. Tiny pleural effusions blunt the posterior costophrenic angles. Bones demineralized. IMPRESSION: Enlargement of cardiac silhouette. Bronchitic and new diffuse interstitial infiltrates and tiny pleural effusions, could represent pulmonary edema or atypical infection though interval development of interstitial lung disease is not excluded. Electronically Signed   By: Lavonia Dana M.D.   On: 11/18/2016 18:35    EKG: Orders placed or performed during the hospital encounter of 11/18/16  . EKG 12-Lead  . EKG 12-Lead  . ED EKG within 10 minutes  . ED EKG within 10 minutes    IMPRESSION AND PLAN: Patient is a 66 year old white male with history of COPD systolic dysfunction presenting with shortness of breath chest x-ray with bilateral infiltrates  1. Shortness of breath Could be multifactorial including possible CHF as well as atypical pulmonary infection Obtain a CT scan of the chest I will place him on low-dose IV Lasix to see if he responds to that I will place him on IV antibiotics Patient had a recent echocardiogram according to him in February I am unable to pull dose results but he states that he sees Dr. Candis Musa  2. Acute on chronic COPD exasperation I will treat patient with nebulizers steroids and antibiotics  3. Diabetes type 2 continue metformin will need to hold post-CT, we'll also place him on sliding scale insulin  4. History of systolic dysfunction continue Entresto   5. Hyperlipidemia unspecified continue therapy with Zocor  6. Nicotine abuse smoking cessation provided to the patient recommended he stop smoking offered him a nicotine patch she does not want a nicotine patch 4 minutes spent   All the records are reviewed and case discussed with ED provider. Management plans discussed with the patient, family and they are in agreement.  CODE STATUS: Code Status History     Date Active Date Inactive Code Status Order ID Comments User Context   04/12/2014  8:28 PM 04/14/2014  8:54 PM Full Code [id number]  Erline Levine, MD Inpatient       TOTAL TIME TAKING CARE OF THIS PATIENT: 55 minutes.    Dustin Flock M.D on 11/18/2016 at 7:06 PM  Between 7am to 6pm - Pager - [phone]  After 6pm go to [email or web address] - password [email or web address] [facility]  Office  [phone]  CC: Primary care physician; Elsie Stain, MD

## 2016-11-18 NOTE — Progress Notes (Signed)
VS by Edmonia Caprio, RN

## 2016-11-19 ENCOUNTER — Inpatient Hospital Stay (HOSPITAL_COMMUNITY)
Admit: 2016-11-19 | Discharge: 2016-11-19 | Disposition: A | Discharge status: Home / Self Care | Payer: Medicare Other | Attending: Student | Admitting: Student

## 2016-11-19 ENCOUNTER — Encounter: Payer: Self-pay | Admitting: Student

## 2016-11-19 DIAGNOSIS — I428 Other cardiomyopathies: Secondary | ICD-10-CM

## 2016-11-19 DIAGNOSIS — I427 Cardiomyopathy due to drug and external agent: Secondary | ICD-10-CM

## 2016-11-19 DIAGNOSIS — I5023 Acute on chronic systolic (congestive) heart failure: Secondary | ICD-10-CM

## 2016-11-19 LAB — ECHOCARDIOGRAM COMPLETE
HEIGHTINCHES: 70 in
Weight: 3617.6 oz

## 2016-11-19 LAB — BASIC METABOLIC PANEL
Anion gap: 6 (ref 5–15)
BUN: 16 mg/dL (ref 6–20)
CHLORIDE: 106 mmol/L (ref 101–111)
CO2: 29 mmol/L (ref 22–32)
CREATININE: 0.77 mg/dL (ref 0.61–1.24)
Calcium: 8.3 mg/dL — ABNORMAL LOW (ref 8.9–10.3)
GFR calc Af Amer: >60 mL/min (ref >60)
GFR calc non Af Amer: >60 mL/min (ref >60)
Glucose, Bld: 162 mg/dL — ABNORMAL HIGH (ref 65–99)
Potassium: 3.6 mmol/L (ref 3.5–5.1)
Sodium: 141 mmol/L (ref 135–145)

## 2016-11-19 LAB — CBC
HEMATOCRIT: 29.9 % — AB (ref 40.0–52.0)
HEMOGLOBIN: 9.1 g/dL — AB (ref 13.0–18.0)
MCH: 19.3 pg — AB (ref 26.0–34.0)
MCHC: 30.3 g/dL — AB (ref 32.0–36.0)
MCV: 63.7 fL — AB (ref 80.0–100.0)
Platelets: 305 10*3/uL (ref 150–440)
RBC: 4.69 MIL/uL (ref 4.40–5.90)
RDW: 20.4 % — ABNORMAL HIGH (ref 11.5–14.5)
WBC: 7.2 10*3/uL (ref 3.8–10.6)

## 2016-11-19 LAB — GLUCOSE, CAPILLARY: Glucose-Capillary: 120 mg/dL — ABNORMAL HIGH (ref 65–99)

## 2016-11-19 LAB — PHOSPHORUS: PHOSPHORUS: 2.4 mg/dL — AB (ref 2.5–4.6)

## 2016-11-19 LAB — TROPONIN I: Troponin I: <0.03 ng/mL (ref <0.03)

## 2016-11-19 LAB — MAGNESIUM
MAGNESIUM: 2 mg/dL (ref 1.7–2.4)
Magnesium: 1.6 mg/dL — ABNORMAL LOW (ref 1.7–2.4)

## 2016-11-19 MED ORDER — PREDNISONE 10 MG PO TABS
10.0000 mg | ORAL_TABLET | Freq: Every day | ORAL | 0 refills | Status: DC
Start: 2016-11-19 — End: 2016-11-30

## 2016-11-19 MED ORDER — K PHOS MONO-SOD PHOS DI & MONO 155-852-130 MG PO TABS
500.0000 mg | ORAL_TABLET | ORAL | Status: DC
  Administered 2016-11-19: 500 mg via ORAL
  Filled 2016-11-19: qty 2

## 2016-11-19 MED ORDER — LEVOFLOXACIN 750 MG PO TABS: 750.0000 mg | ORAL_TABLET | Freq: Every day | ORAL | 0 refills | Status: DC

## 2016-11-19 MED ORDER — SPIRONOLACTONE 25 MG PO TABS
25.0000 mg | ORAL_TABLET | Freq: Every day | ORAL | Status: DC
  Administered 2016-11-19: 25 mg via ORAL
  Filled 2016-11-19: qty 1

## 2016-11-19 MED ORDER — MAGNESIUM SULFATE 2 GM/50ML IV SOLN
2.0000 g | Freq: Once | INTRAVENOUS | Status: AC
  Administered 2016-11-19: 2 g via INTRAVENOUS
  Filled 2016-11-19: qty 50

## 2016-11-19 MED ORDER — FUROSEMIDE 10 MG/ML IJ SOLN
40.0000 mg | Freq: Two times a day (BID) | INTRAMUSCULAR | Status: DC
  Administered 2016-11-19: 40 mg via INTRAVENOUS
  Filled 2016-11-19: qty 4

## 2016-11-19 MED ORDER — FUROSEMIDE 40 MG PO TABS
40.0000 mg | ORAL_TABLET | Freq: Two times a day (BID) | ORAL | 0 refills | Status: DC
Start: 2016-11-19 — End: 2016-11-25

## 2016-11-19 MED ORDER — FUROSEMIDE 10 MG/ML IJ SOLN: 40.0000 mg | Freq: Two times a day (BID) | INTRAMUSCULAR | Status: DC

## 2016-11-19 NOTE — Progress Notes (Signed)
*  PRELIMINARY RESULTS* Echocardiogram 2D Echocardiogram has been performed.  Anani Gu S Crissy Mccreadie 11/19/2016, 10:14 AM

## 2016-11-19 NOTE — Discharge Summary (Addendum)
[facility] NAME: Mark Morris    MR#:  [id number]  DATE OF BIRTH:  1950/09/08  DATE OF ADMISSION:  11/18/2016 ADMITTING PHYSICIAN: Dustin Flock, MD  DATE OF DISCHARGE: 11/19/2016  PRIMARY CARE PHYSICIAN: Elsie Stain, MD    ADMISSION DIAGNOSIS:  SOB (shortness of breath) [R06.02] Exertional dyspnea [R06.09] Unstable chest pain due to insufficient blood supply to heart (HCC) [I20.0]  DISCHARGE DIAGNOSIS:  Active Problems:   Congestive dilated cardiomyopathy (HCC)   SOB (shortness of breath)   Acute on chronic systolic heart failure (HCC)   Nonischemic cardiomyopathy ([place])   SECONDARY DIAGNOSIS:   Past Medical History:  Diagnosis Date  . Allergy 2002   allergic rhinitis  . Anxiety 08/24/1999  . Arthritis   . BPH (benign prostatic hyperplasia)   . CAD (coronary artery disease)    a. 2004: Non-obstructive CAD with Proximal RCA 25%, EF 65%;  b. Myoview 12/05: no scar or ischemia, EF 58% c. 02/2014: cath showing 30% pLAD disease with mild luminal irregularities, EF reduced to 35-45%  . COPD (chronic obstructive pulmonary disease) ([place])   . DDD (degenerative disc disease), lumbosacral   . Depression 08/24/1999  . Diabetes mellitus 08/24/2003   typeII  . Dysrhythmia 2013   h/o SVT   . Enteritis 10/18/2004   CT abd. Enteritis//CT pelvis Sacroiliac Spurring  . Hearing difficulty of both ears   . HNP (herniated nucleus pulposus), lumbar   . Hyperlipidemia 08/23/1997  . Hypertension 1980  . Paroxysmal SVT (supraventricular tachycardia) (HCC)    History of  . Post-operative infection 2006   after gastric bypass for weight loss  . Shortness of breath    exertional  . Sigmoid polyp 07/03/2007   colonoscopy Small internal hemorrhoids; Sigmoid polyp B9 (Dr. Elliott)//colonoscopy normal 04/06/2004  . Sleep apnea    does not use C-pap ; study done >10 years ago  . Tobacco abuse     HOSPITAL COURSE:   66 year old male  with a history of nonobstructive coronary artery disease with ejection fraction of 35-45%, diabetes and BPH who presents with shortness of breath.  1. Acute on chronic systolic heart failure: CT scan showed evidence of mild CHF. He was diuresed and is asymptomatic currently He is referred to CHF clinic and follow-up with his cardiologist within 1 week. He will continue Entresto 2. Community-acquired pneumonia: Patient was initiated on Rocephin and azithromycin and discharged on Levaquin.  3. Acute on chronic COPD exacerbation: Patient symptoms have improved. He will continue inhalers and will be discharged on prednisone taper  4. Diabetes: Patient will continue outpatient medications  5. Hyperlipidemia: Continue statin  6. History of sleep apnea patient does not use CPAP 7. .4 x 4.6 cm ascending aortic aneurysm. Ascending thoracic aortic aneurysm. He will have follow up with Dr Lucky Cowboy and it is Recommended to have  semi-annual imaging followup by CTA or MRA. DISCHARGE CONDITIONS AND DIET:   Stable for discharge on heart healthy diabetic diet  CONSULTS OBTAINED:  Treatment Team:  Wellington Hampshire, MD  DRUG ALLERGIES:   Allergies  Allergen Reactions  . Codeine     nausea  . Sulfonamide Derivatives     nausea    DISCHARGE MEDICATIONS:   Current Discharge Medication List    START taking these medications   Details  furosemide (LASIX) 40 MG tablet Take 1 tablet (40 mg total) by mouth 2 (two) times daily. Qty: 30 tablet, Refills: 0    levofloxacin (LEVAQUIN)  750 MG tablet Take 1 tablet (750 mg total) by mouth daily. Qty: 5 tablet, Refills: 0    predniSONE (DELTASONE) 10 MG tablet Take 1 tablet (10 mg total) by mouth daily with breakfast. 60 mg PO (ORAL) x 2 days 50 mg PO (ORAL)  x 2 days 40 mg PO (ORAL)  x 2 days 30 mg PO  (ORAL)  x 2 days 20 mg PO  (ORAL) x 2 days 10 mg PO  (ORAL) x 2 days then stop Qty: 42 tablet, Refills: 0      CONTINUE these medications which have  NOT CHANGED   Details  albuterol (PROVENTIL HFA;VENTOLIN HFA) 108 (90 Base) MCG/ACT inhaler Inhale 2 puffs into the lungs every 6 (six) hours as needed for wheezing or shortness of breath (fill with proair inhaler). Qty: 1 Inhaler, Refills: 2    ALPRAZolam (XANAX) 0.5 MG tablet TAKE ONE TABLET BY MOUTH TWICE DAILY AS NEEDED Qty: 60 tablet, Refills: 2    Blood Glucose Monitoring Suppl (ONE TOUCH ULTRA SYSTEM KIT) W/DEVICE KIT Check blood sugar once daily and as directed. Ex E11.9 Qty: 1 each, Refills: 0    budesonide-formoterol (SYMBICORT) 160-4.5 MCG/ACT inhaler Inhale 1 puff into the lungs 2 (two) times daily. Rinse after use Qty: 3 Inhaler, Refills: 3    calcium elemental as carbonate (CALCIUM ANTACID ULTRA) 400 MG tablet Chew 1,000 mg by mouth daily.      carvedilol (COREG) 6.25 MG tablet Take 1 tablet (6.25 mg total) by mouth 2 (two) times daily. Qty: 180 tablet, Refills: 3    cyanocobalamin (,VITAMIN B-12,) 1000 MCG/ML injection Inject 1,000 mcg into the muscle every 14 (fourteen) days.    cyclobenzaprine (FLEXERIL) 10 MG tablet TAKE ONE-HALF TO ONE TABLET BY MOUTH TWICE DAILY AS NEEDED FOR MUSCLE SPASM (CAUSES SEDATION) Qty: 60 tablet, Refills: 3    doxazosin (CARDURA) 2 MG tablet TAKE ONE TABLET BY MOUTH ONCE DAILY Qty: 90 tablet, Refills: 3    ferrous sulfate 325 (65 FE) MG tablet Take 1 tablet (325 mg total) by mouth daily with breakfast. Qty: 100 tablet, Refills: 3   Associated Diagnoses: Iron deficiency anemia, unspecified iron deficiency anemia type    finasteride (PROSCAR) 5 MG tablet Take 1 tablet (5 mg total) by mouth daily. Qty: 90 tablet, Refills: 3    FLUoxetine (PROZAC) 40 MG capsule Take 1 capsule (40 mg total) by mouth daily. Qty: 90 capsule, Refills: 3    fluticasone (FLONASE) 50 MCG/ACT nasal spray Place 2 sprays into both nostrils daily. Qty: 16 g, Refills: 12    !! glucose blood (ONE TOUCH ULTRA TEST) test strip Use as instructed to test blood sugar  1-2 times daily.  Diagnosis:E11.9.  History of high and low sugars, with need for mult testing per day Qty: 200 each, Refills: 3    guaiFENesin (MUCINEX) 600 MG 12 hr tablet Take 1 tablet (600 mg total) by mouth 2 (two) times daily.    metFORMIN (GLUCOPHAGE) 500 MG tablet Take 2 tablets (1,000 mg total) by mouth 2 (two) times daily with a meal.    Multiple Vitamin (MULTIVITAMIN) tablet Take 1 tablet by mouth daily.      !! ONE TOUCH ULTRA TEST test strip USE STRIP AS INSTRUCTED TO TEST BLOOD SUGAR ONCE DAILY. Qty: 50 each, Refills: [place] check blood sugar once daily and as directed.E11.9 Qty: 100 each, Refills: 3    potassium chloride (K-DUR) 10 MEQ tablet Take 3 tablets (  30 mEq total) by mouth daily. Qty: 270 tablet, Refills: 3    rOPINIRole (REQUIP) 1 MG tablet Take 1 tablet (1 mg total) by mouth at bedtime. Qty: 90 tablet, Refills: 3    sacubitril-valsartan (ENTRESTO) 49-51 MG Take 1 tablet by mouth 2 (two) times daily. Qty: 60 tablet, Refills: 6    simvastatin (ZOCOR) 20 MG tablet TAKE ONE TABLET BY MOUTH ONCE DAILY Qty: 90 tablet, Refills: 3    tiotropium (SPIRIVA HANDIHALER) 18 MCG inhalation capsule Place 1 capsule (18 mcg total) into inhaler and inhale daily. Qty: 90 capsule, Refills: 3    traZODone (DESYREL) 50 MG tablet TAKE ONE-HALF TO ONE TABLET BY MOUTH AT BEDTIME AS NEEDED FOR SLEEP Qty: 90 tablet, Refills: 3     !! - Potential duplicate medications found. Please discuss with provider.    STOP taking these medications     meloxicam (MOBIC) 15 MG tablet           Today   CHIEF COMPLAINT:  Patient reports that his shortness of breath is much improved.   VITAL SIGNS:  Blood pressure 124/69, pulse 94, temperature 97.9 F (36.6 C), temperature source Oral, resp. rate 14, height 5' 10"  (1.778 m), weight 101.6 kg (224 lb), SpO2 91 %.   REVIEW OF SYSTEMS:  Review of Systems  Constitutional: Negative.  Negative for  chills, fever and malaise/fatigue.  HENT: Negative.  Negative for ear discharge, ear pain, hearing loss, nosebleeds and sore throat.   Eyes: Negative.  Negative for blurred vision and pain.  Respiratory: Negative.  Negative for cough, hemoptysis, shortness of breath and wheezing.   Cardiovascular: Negative.  Negative for chest pain, palpitations and leg swelling.  Gastrointestinal: Negative.  Negative for abdominal pain, blood in stool, diarrhea, nausea and vomiting.  Genitourinary: Negative.  Negative for dysuria.  Musculoskeletal: Negative.  Negative for back pain.  Skin: Negative.   Neurological: Negative for dizziness, tremors, speech change, focal weakness, seizures and headaches.  Endo/Heme/Allergies: Negative.  Does not bruise/bleed easily.  Psychiatric/Behavioral: Negative.  Negative for depression, hallucinations and suicidal ideas.     PHYSICAL EXAMINATION:  GENERAL:  66 y.o.-year-old patient lying in the bed with no acute distress.  NECK:  Supple, no jugular venous distention. No thyroid enlargement, no tenderness.  LUNGS: Normal breath sounds bilaterally, no wheezing, rales,rhonchi  No use of accessory muscles of respiration.  CARDIOVASCULAR: S1, S2 normal. No murmurs, rubs, or gallops.  ABDOMEN: Soft, non-tender, non-distended. Bowel sounds present. No organomegaly or mass.  EXTREMITIES: No pedal edema, cyanosis, or clubbing.  PSYCHIATRIC: The patient is alert and oriented x 3.  SKIN: No obvious rash, lesion, or ulcer.   DATA REVIEW:   CBC  Recent Labs Lab 11/19/16 0531  WBC 7.2  HGB 9.1*  HCT 29.9*  PLT 305    Chemistries   Recent Labs Lab 11/19/16 0531  NA 141  K 3.6  CL 106  CO2 29  GLUCOSE 162*  BUN 16  CREATININE 0.77  CALCIUM 8.3*  MG 1.6*    Cardiac Enzymes  Recent Labs Lab 11/18/16 1742 11/18/16 2328 11/19/16 0531  TROPONINI 0.04* 0.05* <0.03    Microbiology Results  @[email or web address] @  RADIOLOGY:  Dg Chest 2 View  Result Date:  11/18/2016 CLINICAL DATA:  Increasing congestion, shortness of breath and intermittent chest pain with exertion for approximately 2 weeks EXAM: CHEST  2 VIEW COMPARISON:  07/25/2013 FINDINGS: Minimal enlargement of cardiac silhouette. Mediastinal contours and pulmonary vascularity normal. Bronchitic changes with diffuse interstitial infiltrates/thickening  in both lungs new since 2014, could represent atypical infection, edema, or interval development of interstitial lung disease. No segmental consolidation or pneumothorax. Tiny pleural effusions blunt the posterior costophrenic angles. Bones demineralized. IMPRESSION: Enlargement of cardiac silhouette. Bronchitic and new diffuse interstitial infiltrates and tiny pleural effusions, could represent pulmonary edema or atypical infection though interval development of interstitial lung disease is not excluded. Electronically Signed   By: Lavonia Dana M.D.   On: 11/18/2016 18:35   Ct Angio Chest Pe W Or Wo Contrast  Result Date: 11/18/2016 CLINICAL DATA:  Congestion, shortness of breath and intermittent chest pain with exertion for 2 weeks. Low hemoglobin. History of diabetes, hypertension, COPD. EXAM: CT ANGIOGRAPHY CHEST WITH CONTRAST TECHNIQUE: Multidetector CT imaging of the chest was performed using the standard protocol during bolus administration of intravenous contrast. Multiplanar CT image reconstructions and MIPs were obtained to evaluate the vascular anatomy. CONTRAST:  75 cc Isovue 370 COMPARISON:  Chest radiograph November 18, 2016 at 1756 hours FINDINGS: CARDIOVASCULAR: Adequate contrast opacification of the pulmonary artery's. Main pulmonary artery is not enlarged. No pulmonary arterial filling defects to the level of the segmental branches. Respiratory motion limits assessment of subsegmental pulmonary branches. The heart is mildly enlarged. Moderate coronary artery calcifications. No pericardial effusion. Fusiform aneurysmal 4.4 x 4.6 cm ascending aorta.  Mild calcific atherosclerosis of the aortic arch. MEDIASTINUM/NODES: 14 mm aortopulmonary window lymph node. Additional subcentimeter mediastinal and hilar lymph nodes. LUNGS/PLEURA: Tracheobronchial tree is patent, no pneumothorax. Diffuse bronchial wall thickening. Small bilateral pleural effusions. At least 5 scattered ground-glass nodules measuring up to 4 mm. Apical paraseptal emphysema. Moderate centrilobular emphysema edema. UPPER ABDOMEN: Included view of the abdomen is unremarkable. MUSCULOSKELETAL: Status post cholecystectomy, gastric bypass and abdominal herniorrhaphy. Colonic intra positioning. 15 mm cyst or possible hemangioma dome of the liver. Contrast refluxed into the intrahepatic IVC. Review of the MIP images confirms the above findings. IMPRESSION: No acute pulmonary embolism. RIGHT middle lobe atelectasis versus pneumonia. Mild cardiomegaly and suspected RIGHT heart failure. Small pleural effusions. Moderate emphysema. Bronchial wall thickening can be seen with bronchitis, pulmonary edema or reactive airway disease. **An incidental finding of potential clinical significance has been found. 4.4 x 4.6 cm ascending aortic aneurysm. Ascending thoracic aortic aneurysm. Recommend semi-annual imaging followup by CTA or MRA and referral to cardiothoracic surgery if not already obtained. This recommendation follows 2010 ACCF/AHA/AATS/ACR/ASA/SCA/SCAI/SIR/STS/SVM Guidelines for the Diagnosis and Management of Patients With Thoracic Aortic Disease. Circulation. 2010; 121: G626-R485** **An incidental finding of potential clinical significance has been found. Multiple sub solid less than 6 mm pulmonary nodules. Recommend follow-up CT at 3-6 months.** Electronically Signed   By: Elon Alas M.D.   On: 11/18/2016 20:33      Current Discharge Medication List    START taking these medications   Details  furosemide (LASIX) 40 MG tablet Take 1 tablet (40 mg total) by mouth 2 (two) times daily. Qty:  30 tablet, Refills: 0    levofloxacin (LEVAQUIN) 750 MG tablet Take 1 tablet (750 mg total) by mouth daily. Qty: 5 tablet, Refills: 0    predniSONE (DELTASONE) 10 MG tablet Take 1 tablet (10 mg total) by mouth daily with breakfast. 60 mg PO (ORAL) x 2 days 50 mg PO (ORAL)  x 2 days 40 mg PO (ORAL)  x 2 days 30 mg PO  (ORAL)  x 2 days 20 mg PO  (ORAL) x 2 days 10 mg PO  (ORAL) x 2 days then stop Qty: 42 tablet, Refills: 0  CONTINUE these medications which have NOT CHANGED   Details  albuterol (PROVENTIL HFA;VENTOLIN HFA) 108 (90 Base) MCG/ACT inhaler Inhale 2 puffs into the lungs every 6 (six) hours as needed for wheezing or shortness of breath (fill with proair inhaler). Qty: 1 Inhaler, Refills: 2    ALPRAZolam (XANAX) 0.5 MG tablet TAKE ONE TABLET BY MOUTH TWICE DAILY AS NEEDED Qty: 60 tablet, Refills: 2    Blood Glucose Monitoring Suppl (ONE TOUCH ULTRA SYSTEM KIT) W/DEVICE KIT Check blood sugar once daily and as directed. Ex E11.9 Qty: 1 each, Refills: 0    budesonide-formoterol (SYMBICORT) 160-4.5 MCG/ACT inhaler Inhale 1 puff into the lungs 2 (two) times daily. Rinse after use Qty: 3 Inhaler, Refills: 3    calcium elemental as carbonate (CALCIUM ANTACID ULTRA) 400 MG tablet Chew 1,000 mg by mouth daily.      carvedilol (COREG) 6.25 MG tablet Take 1 tablet (6.25 mg total) by mouth 2 (two) times daily. Qty: 180 tablet, Refills: 3    cyanocobalamin (,VITAMIN B-12,) 1000 MCG/ML injection Inject 1,000 mcg into the muscle every 14 (fourteen) days.    cyclobenzaprine (FLEXERIL) 10 MG tablet TAKE ONE-HALF TO ONE TABLET BY MOUTH TWICE DAILY AS NEEDED FOR MUSCLE SPASM (CAUSES SEDATION) Qty: 60 tablet, Refills: 3    doxazosin (CARDURA) 2 MG tablet TAKE ONE TABLET BY MOUTH ONCE DAILY Qty: 90 tablet, Refills: 3    ferrous sulfate 325 (65 FE) MG tablet Take 1 tablet (325 mg total) by mouth daily with breakfast. Qty: 100 tablet, Refills: 3   Associated Diagnoses: Iron deficiency  anemia, unspecified iron deficiency anemia type    finasteride (PROSCAR) 5 MG tablet Take 1 tablet (5 mg total) by mouth daily. Qty: 90 tablet, Refills: 3    FLUoxetine (PROZAC) 40 MG capsule Take 1 capsule (40 mg total) by mouth daily. Qty: 90 capsule, Refills: 3    fluticasone (FLONASE) 50 MCG/ACT nasal spray Place 2 sprays into both nostrils daily. Qty: 16 g, Refills: 12    !! glucose blood (ONE TOUCH ULTRA TEST) test strip Use as instructed to test blood sugar 1-2 times daily.  Diagnosis:E11.9.  History of high and low sugars, with need for mult testing per day Qty: 200 each, Refills: 3    guaiFENesin (MUCINEX) 600 MG 12 hr tablet Take 1 tablet (600 mg total) by mouth 2 (two) times daily.    metFORMIN (GLUCOPHAGE) 500 MG tablet Take 2 tablets (1,000 mg total) by mouth 2 (two) times daily with a meal.    Multiple Vitamin (MULTIVITAMIN) tablet Take 1 tablet by mouth daily.      !! ONE TOUCH ULTRA TEST test strip USE STRIP AS INSTRUCTED TO TEST BLOOD SUGAR ONCE DAILY. Qty: 50 each, Refills: [place] check blood sugar once daily and as directed.E11.9 Qty: 100 each, Refills: 3    potassium chloride (K-DUR) 10 MEQ tablet Take 3 tablets (30 mEq total) by mouth daily. Qty: 270 tablet, Refills: 3    rOPINIRole (REQUIP) 1 MG tablet Take 1 tablet (1 mg total) by mouth at bedtime. Qty: 90 tablet, Refills: 3    sacubitril-valsartan (ENTRESTO) 49-51 MG Take 1 tablet by mouth 2 (two) times daily. Qty: 60 tablet, Refills: 6    simvastatin (ZOCOR) 20 MG tablet TAKE ONE TABLET BY MOUTH ONCE DAILY Qty: 90 tablet, Refills: 3    tiotropium (SPIRIVA HANDIHALER) 18 MCG inhalation capsule Place 1 capsule (18 mcg total) into inhaler and inhale daily. Qty:  90 capsule, Refills: 3    traZODone (DESYREL) 50 MG tablet TAKE ONE-HALF TO ONE TABLET BY MOUTH AT BEDTIME AS NEEDED FOR SLEEP Qty: 90 tablet, Refills: 3     !! - Potential duplicate medications found. Please  discuss with provider.    STOP taking these medications     meloxicam (MOBIC) 15 MG tablet          Beta Blocker Prescribed? Yes For EF <40%, Entresto prescribed Management plans discussed with the patient and he is in agreement. Stable for discharge home  Patient should follow up with dr Rockey Situ  CODE STATUS:     Code Status Orders        Start     Ordered   11/18/16 2044  Full code  Continuous     11/18/16 2043    Code Status History    Date Active Date Inactive Code Status Order ID Comments User Context   04/12/2014  8:28 PM 04/14/2014  8:54 PM Full Code [id number]  Erline Levine, MD Inpatient      TOTAL TIME TAKING CARE OF THIS PATIENT: 38 minutes.    Note: This dictation was prepared with Dragon dictation along with smaller phrase technology. Any transcriptional errors that result from this process are unintentional.  Mayar Whittier M.D on 11/19/2016 at 11:36 AM  Between 7am to 6pm - Pager - [phone] After 6pm go to [email or web address] - password [email or web address] [facility]  Office  [phone]  CC: Primary care physician; Elsie Stain, MD

## 2016-11-19 NOTE — Progress Notes (Signed)
MEDICATION RELATED CONSULT NOTE - INITIAL   Pharmacy Consult for Electrolyte management Indication: hypomagnesemia  Allergies  Allergen Reactions  . Codeine     nausea  . Sulfonamide Derivatives     nausea    Patient Measurements: Height: 5\' 10"  (177.8 cm) Weight: 226 lb 1.6 oz (102.6 kg) IBW/kg (Calculated) : 73 Adjusted Body Weight: 85 kg  Vital Signs: Temp: 97.9 F (36.6 C) (03/30 0434) Temp Source: Oral (03/30 0434) BP: 124/69 (03/30 0434) Pulse Rate: 94 (03/30 0434) Intake/Output from previous day: 03/29 0701 - 03/30 0700 In: -  Out: 1150 [Urine:1150] Intake/Output from this shift: Total I/O In: -  Out: 1150 [Urine:1150]  Labs:  Recent Labs  11/18/16 0900 11/18/16 1742 11/19/16 0531  WBC 7.9 9.5 7.2  HGB 9.0* 9.1* 9.1*  HCT 30.2* 29.7* 29.9*  PLT 314.0 322 305  CREATININE  --  0.84 0.77  MG  --   --  1.6*   Estimated Creatinine Clearance: 113.4 mL/min (by C-G formula based on SCr of 0.77 mg/dL).   Microbiology: No results found for this or any previous visit (from the past 720 hour(s)).  Medical History: Past Medical History:  Diagnosis Date  . Allergy 2002   allergic rhinitis  . Anxiety 08/24/1999  . Arthritis   . BPH (benign prostatic hyperplasia)   . CAD (coronary artery disease)    Nonobstructive by cardiac catheterization 5/04: Proximal RCA 25%, EF 65%;  Myoview 12/05: no scar or ischemia, EF 58%  . COPD (chronic obstructive pulmonary disease) ([place])   . DDD (degenerative disc disease), lumbosacral   . Depression 08/24/1999  . Diabetes mellitus 08/24/2003   typeII  . Dysrhythmia 2013   h/o SVT   . Enteritis 10/18/2004   CT abd. Enteritis//CT pelvis Sacroiliac Spurring  . Hearing difficulty of both ears   . HNP (herniated nucleus pulposus), lumbar   . Hyperlipidemia 08/23/1997  . Hypertension 1980  . Paroxysmal SVT (supraventricular tachycardia) (HCC)    History of  . Post-operative infection 2006   after gastric bypass for  weight loss  . Shortness of breath    exertional  . Sigmoid polyp 07/03/2007   colonoscopy Small internal hemorrhoids; Sigmoid polyp B9 (Dr. Elliott)//colonoscopy normal 04/06/2004  . Sleep apnea    does not use C-pap ; study done >10 years ago  . Tobacco abuse     Medications:  Scheduled:  . albuterol  2.5 mg Nebulization Q6H  . azithromycin  500 mg Intravenous Q24H  . carvedilol  6.25 mg Oral BID  . cefTRIAXone (ROCEPHIN)  IV  1 g Intravenous q1800  . doxazosin  2 mg Oral Daily  . enoxaparin (LOVENOX) injection  40 mg Subcutaneous Q24H  . ferrous sulfate  325 mg Oral Q breakfast  . finasteride  5 mg Oral Daily  . FLUoxetine  40 mg Oral Daily  . fluticasone  2 spray Each Nare Daily  . furosemide  20 mg Intravenous Q12H  . guaiFENesin  600 mg Oral BID  . ipratropium  0.5 mg Nebulization Q6H  . magnesium sulfate 1 - 4 g bolus IVPB  2 g Intravenous Once  . meloxicam  15 mg Oral Daily  . metFORMIN  1,000 mg Oral BID WC  . methylPREDNISolone (SOLU-MEDROL) injection  60 mg Intravenous Q6H  . mometasone-formoterol  2 puff Inhalation BID  . potassium chloride  30 mEq Oral Daily  . rOPINIRole  1 mg Oral QHS  . sacubitril-valsartan  1 tablet Oral BID  . simvastatin  20 mg Oral q1800  . sodium chloride flush  3 mL Intravenous Q12H  . tiotropium  18 mcg Inhalation Daily    Assessment: Patient admitted for SOB w/ CP w/ exertion. Pt has had some runs of V-tach. Mg drawn found to be 1.6  Goal of Therapy:  Mg 1.8 - 2.2 K 3.5 - 5.0 Phos 2.5 - 4.5  Plan:  Will administer Mg sulfate 2g IV x 1 and will recheck Mg and check a phos today @ 1000. K+ WNL  Thank you for this consult.  Tobie Lords, PharmD, BCPS Clinical Pharmacist 11/19/2016

## 2016-11-19 NOTE — Plan of Care (Signed)
Problem: Cardiac: Goal: Ability to achieve and maintain adequate cardiopulmonary perfusion will improve Outcome: Progressing Monitor daily weight and intervene therapy as needed.   Problem: Education: Goal: Ability to demonstrate managment of disease process will improve Outcome: Progressing Reviewed management tools with patient. Reviewed CHF booklet with patient.

## 2016-11-19 NOTE — Progress Notes (Signed)
CCMD reports a 5 beat run of v-tach. Pt offered no concerns at this time

## 2016-11-19 NOTE — Care Management Important Message (Signed)
Important Message  Patient Details  Name: BOOKER BHATNAGAR MRN: [id number] Date of Birth: 1950-09-16   Medicare Important Message Given:  Yes    Katrina Stack, RN 11/19/2016, 9:55 AM

## 2016-11-19 NOTE — Consult Note (Signed)
Cardiology Consult    Patient ID: Mark Morris MRN: [id number], DOB/AGE: [birth year]   Admit date: 11/18/2016 Date of Consult: 11/19/2016  Primary Physician: Elsie Stain, MD Reason for Consult: Chest Pain/ Dyspnea on Exertion Primary Cardiologist: Dr. Rockey Situ Requesting Provider: Dr. Posey Pronto    History of Present Illness    Mark Morris is a 66 y.o. male who is being seen today for the evaluation of dyspnea with exertion and chest pain at the request of Dr. Posey Pronto. Past medical history is significant for CAD (mild by cath in 2004, repeat cath in 02/2014 showing 30% pLAD disease with mild luminal irregularities), nonischemic cardiomyopathy (EF 35-45% by cath in 02/2014), HTN, HLD, pSVT, Type 2 DM, COPD, and tobacco use. He presented to [facility] on 11/18/2016 for worsening dyspnea and chest discomfort for the past 2 weeks.   Was last seen by Dr. Rockey Situ in 07/2016 and reported having chronic dyspnea on exertion at that time. Denied any recent chest discomfort. Weight was 222 lbs at that time. He was continued on Coreg 6.25mg  BID, Cardura 2mg  daily, Simvastatin 20mg  daily, and Entresto 45-51mg  BID.   In talking with the patient today, he reports having progressive fatigue for several months. This has been followed by his PCP ans he is undergoing a thorough workup for his anemia. Over the past 4 weeks, he has developed significant dyspnea at rest and with exertion. Reports associated episodes of chest pain which can occur with activity but can last up to 1 hour prior to resolving.   He reports good compliance with his medication regimen. He initially lost weight in 08/2016 due to changes in diet, saying weight was down to 195 lbs but over the past month, his weight has significantly increased. Reports abdominal bloating. No PND, orthopnea, or lower extremity edema. Has experienced "constant diarrhea" for over 1 year.   Initial labs showed WBC 9.5, Hgb 9.1, platelets 322. K+ 3.6, creatinine 0.84.  BNP 600. Cyclic troponin values have been flat at 0.04, 0.05, and < 0.03. Mg 1.6. CXR with bronchitic and new diffuse interstitial infiltrates and tiny pleural effusions, could represent pulmonary edema or atypical infection. CTA showing no acute PE but right middle lobe atelectasis vs. PNA identified along with mild cardiomegaly and small pleural effusions. Also found to have multiple sub-solid 6 mm pulmonary nodules with follow-up CT in 3-6 months recommended. EKG shows sinus tachycardia, HR 112, LAD, and moderate LVH.   He has been started on IV Lasix 20mg  BID with a net output of -1.1L thus far.    Past Medical History   Past Medical History:  Diagnosis Date  . Allergy 2002   allergic rhinitis  . Anxiety 08/24/1999  . Arthritis   . BPH (benign prostatic hyperplasia)   . CAD (coronary artery disease)    a. 2004: Non-obstructive CAD with Proximal RCA 25%, EF 65%;  b. Myoview 12/05: no scar or ischemia, EF 58% c. 02/2014: cath showing 30% pLAD disease with mild luminal irregularities, EF reduced to 35-45%  . COPD (chronic obstructive pulmonary disease) ([place])   . DDD (degenerative disc disease), lumbosacral   . Depression 08/24/1999  . Diabetes mellitus 08/24/2003   typeII  . Dysrhythmia 2013   h/o SVT   . Enteritis 10/18/2004   CT abd. Enteritis//CT pelvis Sacroiliac Spurring  . Hearing difficulty of both ears   . HNP (herniated nucleus pulposus), lumbar   . Hyperlipidemia 08/23/1997  . Hypertension 1980  . Paroxysmal SVT (supraventricular tachycardia) (H[facility])  History of  . Post-operative infection 2006   after gastric bypass for weight loss  . Shortness of breath    exertional  . Sigmoid polyp 07/03/2007   colonoscopy Small internal hemorrhoids; Sigmoid polyp B9 (Dr. Elliott)//colonoscopy normal 04/06/2004  . Sleep apnea    does not use C-pap ; study done >10 years ago  . Tobacco abuse     Past Surgical History:  Procedure Laterality Date  . ANKLE FRACTURE SURGERY   02/2003  . APPENDECTOMY    . BACK SURGERY  2010  . CARDIAC CATHETERIZATION  01/14/2003   Min plaque EF65%// Cath normal (Dr. Clayborn Bigness 2000)//ECHO Mild AAA TrMR Ef61% 05/30/2002 (Dr.Callwood)  . CARDIAC CATHETERIZATION  2015   [facility]  . CARPAL TUNNEL RELEASE Bilateral   . CHOLECYSTECTOMY    . COLONOSCOPY    . GASTRIC BYPASS  11/24/2004  . GASTRIC FUNDOPLICATION  9678   [facility]  . HERNIA REPAIR  01/02/2006   right ventral Hernia Repair Laparascopic [place] Pai  . KNEE ARTHROSCOPY W/ MENISCAL REPAIR Right   . MAXIMUM ACCESS (MAS)POSTERIOR LUMBAR INTERBODY FUSION (PLIF) 2 LEVEL N/A 04/12/2014   Procedure: Lumbar Four-Five Lumbar Five-Sacral One Redo Laminectomy and Posterior Lumbar Interbody Fusion;  Surgeon: Erline Levine, MD;  Location: [place] [facility];  Service: Neurosurgery;  Laterality: N/A;  Lumbar Four-Five Lumbar Five-Sacral One Redo Laminectomy and Posterior Lumbar Interbody Fusion  . sleep study  03/2004   Sleep Study Hypopneas 60/hr ; Kicks 160/hr     Allergies  Allergies  Allergen Reactions  . Codeine     nausea  . Sulfonamide Derivatives     nausea    Inpatient Medications    . albuterol  2.5 mg Nebulization Q6H  . azithromycin  500 mg Intravenous Q24H  . carvedilol  6.25 mg Oral BID  . cefTRIAXone (ROCEPHIN)  IV  1 g Intravenous q1800  . doxazosin  2 mg Oral Daily  . enoxaparin (LOVENOX) injection  40 mg Subcutaneous Q24H  . ferrous sulfate  325 mg Oral Q breakfast  . finasteride  5 mg Oral Daily  . FLUoxetine  40 mg Oral Daily  . fluticasone  2 spray Each Nare Daily  . furosemide  40 mg Intravenous BID  . guaiFENesin  600 mg Oral BID  . ipratropium  0.5 mg Nebulization Q6H  . magnesium sulfate 1 - 4 g bolus IVPB  2 g Intravenous Once  . meloxicam  15 mg Oral Daily  . methylPREDNISolone (SOLU-MEDROL) injection  60 mg Intravenous Q6H  . mometasone-formoterol  2 puff Inhalation BID  . potassium chloride  30 mEq Oral Daily  . rOPINIRole  1 mg Oral QHS  .  sacubitril-valsartan  1 tablet Oral BID  . simvastatin  20 mg Oral q1800  . sodium chloride flush  3 mL Intravenous Q12H  . tiotropium  18 mcg Inhalation Daily    Family History    Family History  Problem Relation Age of Onset  . Heart disease Mother 70    CABG DM; Died due to Staph infection of sterotomy incision  . Diabetes Mother   . Hypertension Father   . Heart disease Father 49    pneumonia/CHF  . Alcohol abuse Brother 51    Has stopped at present  . Diabetes Brother   . Alcohol abuse Brother   . Diabetes Brother   . Colon cancer Neg Hx   . Prostate cancer Neg Hx     Social History    Social History  Social History  . Marital status: Married    Spouse name: N/A  . Number of children: N/A  . Years of education: N/A   Occupational History  . Not on file.   Social History Main Topics  . Smoking status: Current Every Day Smoker    Packs/day: 0.50    Years: 40.00    Types: Cigarettes  . Smokeless tobacco: Never Used     Comment: cutting down as of 06/2014, down to 1/2 PPD or less.   . Alcohol use No  . Drug use: No  . Sexual activity: Yes     Comment: Rarely   Other Topics Concern  . 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  Cardiovascular:  No edema, orthopnea, palpitations, paroxysmal nocturnal dyspnea. Positive for chest pain and dyspnea on exertion.  Dermatological: No rash, lesions/masses Respiratory: No cough, dyspnea Urologic: No hematuria, dysuria Abdominal:   No nausea, vomiting, bright red blood per rectum, melena, or hematemesis. Positive for diarrhea and abdominal bloating.  Neurologic:  No visual changes, wkns, changes in mental status. All other systems reviewed and are otherwise negative except as noted above.  Physical Exam    Blood pressure  124/69, pulse 94, temperature 97.9 F (36.6 C), temperature source Oral, resp. rate 14, height 5\' 10"  (1.778 m), weight 226 lb 1.6 oz (102.6 kg), SpO2 95 %.  General: Pleasant, Caucasian male appearing in NAD Psych: Normal affect. Neuro: Alert and oriented X 3. Moves all extremities spontaneously. HEENT: Normal  Neck: Supple without bruits. JVD at 9cm. Lungs:  Resp regular and unlabored, wheezing throughout upper lung fields. Rales at bases bilaterally.  Heart: RRR no s3, s4, or murmurs. Abdomen: Soft, non-tender, non-distended, BS + x 4.  Extremities: No clubbing, cyanosis or edema. DP/PT/Radials 2+ and equal bilaterally.  Labs    Troponin (Point of Care Test) No results for input(s): TROPIPOC in the last 72 hours.  Recent Labs  11/18/16 1742 11/18/16 2328 11/19/16 0531  TROPONINI 0.04* 0.05* <0.03   Lab Results  Component Value Date   WBC 7.2 11/19/2016   HGB 9.1 (L) 11/19/2016   HCT 29.9 (L) 11/19/2016   MCV 63.7 (L) 11/19/2016   PLT 305 11/19/2016     Recent Labs Lab 11/19/16 0531  NA 141  K 3.6  CL 106  CO2 29  BUN 16  CREATININE 0.77  CALCIUM 8.3*  GLUCOSE 162*   Lab Results  Component Value Date   CHOL 181 08/13/2015   HDL 52.50 08/13/2015   LDLCALC 107 (H) 08/13/2015   TRIG 108.0 08/13/2015   No results found for: [facility]   Radiology Studies    Dg Chest 2 View  Result Date: 11/18/2016 CLINICAL DATA:  Increasing congestion, shortness of breath and intermittent chest pain with exertion for approximately 2 weeks EXAM: CHEST  2 VIEW COMPARISON:  07/25/2013 FINDINGS: Minimal enlargement of cardiac silhouette. Mediastinal contours and pulmonary vascularity normal. Bronchitic changes with diffuse interstitial infiltrates/thickening in both lungs new since 2014, could represent atypical infection, edema, or interval development of interstitial lung disease. No segmental consolidation or pneumothorax. Tiny pleural effusions blunt the posterior costophrenic  angles. Bones demineralized. IMPRESSION: Enlargement of cardiac silhouette. Bronchitic and new diffuse interstitial infiltrates and tiny pleural effusions, could  represent pulmonary edema or atypical infection though interval development of interstitial lung disease is not excluded. Electronically Signed   By: Lavonia Dana M.D.   On: 11/18/2016 18:35   Ct Angio Chest Pe W Or Wo Contrast  Result Date: 11/18/2016 CLINICAL DATA:  Congestion, shortness of breath and intermittent chest pain with exertion for 2 weeks. Low hemoglobin. History of diabetes, hypertension, COPD. EXAM: CT ANGIOGRAPHY CHEST WITH CONTRAST TECHNIQUE: Multidetector CT imaging of the chest was performed using the standard protocol during bolus administration of intravenous contrast. Multiplanar CT image reconstructions and MIPs were obtained to evaluate the vascular anatomy. CONTRAST:  75 cc Isovue 370 COMPARISON:  Chest radiograph November 18, 2016 at 1756 hours FINDINGS: CARDIOVASCULAR: Adequate contrast opacification of the pulmonary artery's. Main pulmonary artery is not enlarged. No pulmonary arterial filling defects to the level of the segmental branches. Respiratory motion limits assessment of subsegmental pulmonary branches. The heart is mildly enlarged. Moderate coronary artery calcifications. No pericardial effusion. Fusiform aneurysmal 4.4 x 4.6 cm ascending aorta. Mild calcific atherosclerosis of the aortic arch. MEDIASTINUM/NODES: 14 mm aortopulmonary window lymph node. Additional subcentimeter mediastinal and hilar lymph nodes. LUNGS/PLEURA: Tracheobronchial tree is patent, no pneumothorax. Diffuse bronchial wall thickening. Small bilateral pleural effusions. At least 5 scattered ground-glass nodules measuring up to 4 mm. Apical paraseptal emphysema. Moderate centrilobular emphysema edema. UPPER ABDOMEN: Included view of the abdomen is unremarkable. MUSCULOSKELETAL: Status post cholecystectomy, gastric bypass and abdominal  herniorrhaphy. Colonic intra positioning. 15 mm cyst or possible hemangioma dome of the liver. Contrast refluxed into the intrahepatic IVC. Review of the MIP images confirms the above findings. IMPRESSION: No acute pulmonary embolism. RIGHT middle lobe atelectasis versus pneumonia. Mild cardiomegaly and suspected RIGHT heart failure. Small pleural effusions. Moderate emphysema. Bronchial wall thickening can be seen with bronchitis, pulmonary edema or reactive airway disease. **An incidental finding of potential clinical significance has been found. 4.4 x 4.6 cm ascending aortic aneurysm. Ascending thoracic aortic aneurysm. Recommend semi-annual imaging followup by CTA or MRA and referral to cardiothoracic surgery if not already obtained. This recommendation follows 2010 ACCF/AHA/AATS/ACR/ASA/SCA/SCAI/SIR/STS/SVM Guidelines for the Diagnosis and Management of Patients With Thoracic Aortic Disease. Circulation. 2010; 121: W737-T062** **An incidental finding of potential clinical significance has been found. Multiple sub solid less than 6 mm pulmonary nodules. Recommend follow-up CT at 3-6 months.** Electronically Signed   By: Elon Alas M.D.   On: 11/18/2016 20:33    EKG & Cardiac Imaging    EKG: Sinus tachycardia, HR 112, LAD, and moderate LVH.  - Personally Reviewed   Cardiac Catheterization: 02/2014   Assessment & Plan    1. Acute on Chronic Systolic CHF/ Nonischemic Cardiomyopathy - presents with worsening dyspnea at rest and with exertion for over 4 weeks. Patient reports weight was down to 195 lbs in 08/2016, up to 226 lbs on admission. Was at 222 lbs at his office visit in 07/2016. - EF was 35-45% by cath in 2015. Will obtain an echocardiogram to assess LV function and wall motion as no echo is on file.  - BNP elevated to 600 on admission. CXR with bronchitic and new diffuse interstitial infiltrates and tiny pleural effusions, could represent pulmonary edema or atypical infection. CTA  showing no acute PE, right middle lobe atelectasis vs. PNA, mild cardiomegaly and small pleural effusions. - has been started on IV Lasix 20mg  BID with a net output of -1.1L thus far. JVD elevated and rales appreciated on examination. Will increase IV Lasix to 40mg  BID. Follow daily weights  and I&O's. Continue with Coreg 6.25mg  BID and Entresto 45-51mg  BID.  2. CAD - mild by cath in 2004, repeat cath in 02/2014 showing 30% pLAD disease with mild luminal irregularities. - having episodes of chest pain at rest and with exertion, lasting for up to 1 hour at a time.  - Cyclic troponin values have been flat at 0.04, 0.05, and < 0.03. EKG is without acute ischemic changes.  - echocardiogram is pending to assess LV function and wall motion.  - consider repeat nuclear stress test as an outpatient. Would not pursue invasive evaluation at this time with his anemia.   3. HTN - BP well-controlled at 124/69 - 138/86 in the past 24 hours.  - continue current medication regimen.   4. HLD - Lipid Panel in 07/2014 showed total cholesterol 118, HDL 25, and LDL 71. - continue statin therapy.   5. pSVT - he denies any recent palpitations. Mg 1.6 on admission. Has been replaced by Pharmacy.  - continue Coreg.  6. COPD Exacerbation - receiving Azithromycin and Rocephin along with nebulizer treatments.  - per admitting team.  7. Microcytic Anemia - Hgb 9.1 on admission.  - being followed by PCP.   8. Pulmonary Nodules - CTA showed multiple sub-solid 6 mm pulmonary nodules - follow-up CT in 3-6 months recommended.  9. Tobacco Use - has reduced use to 5 cigarettes per day.  - cessation advised.   Signed, Erma Heritage, PA-C 11/19/2016, 8:40 AM Pager: [phone]

## 2016-11-19 NOTE — Progress Notes (Signed)
Pt arrived from ED alert and oriented with no c/o pain or SOB. Skin and Telemetry verified with Yasmin RN. VSS, no concerns offered at this time.

## 2016-11-19 NOTE — Care Management (Signed)
Independent in all adls, denies issues accessing medical care, obtaining medications or with transportation.  Current with her PCP.  No discharge needs identified at present by care manager or members of care team.

## 2016-11-22 ENCOUNTER — Telehealth: Payer: Self-pay

## 2016-11-22 NOTE — Telephone Encounter (Signed)
-----   Message from Alisa Graff, [place] sent at 11/22/2016  8:51 AM EDT ----- Regarding: Please call Contact: [phone] Hospitalist made a referral via the workque.  Discharged 3/30

## 2016-11-24 NOTE — Progress Notes (Signed)
Cardiology Office Note  Date:  11/25/2016   ID:  Mark Morris, DOB 05/23/1951, MRN [id number]  PCP:  Mark Stain, MD   Chief Complaint  Patient presents with  . other    tcm/ph exertional dyspnea, chest pain, sob, CAD. Had echo 3/30. Pt states he has been doing well since being discharged, just still some congestion. Pt would like more antibiotics for this and nitroglycerin. Reviewed meds with pt verbally.    HPI:  Mark Morris is a 66year-old gentleman with history of  Active smoker mild CAD in 2004, Myoview in 2005 showing no significant ischemia,  chronic fatigue and poor energy for the past several years, SVT requiring adenosine,  cardiac catheterization in 02/2014 that showed mild proximal LAD disease,   Ejection fraction 35-45%. nonischemic cardiomyopathy chronic back pain lower as well as upper. He presents for follow-up of his coronary artery disease and cardiomyopathy  Admitted 11/18/16 for SOB, chest pain,  Treated for PNA, COPD exacerbation with ABX, CHF, up 20 pounds,  given lasix Echo 11/19/16, EF 20 to 25% Fusiform aneurysmal 4.4 x 4.6 cm ascending aorta. On CT Apical paraseptal emphysema.  Also with anemia, HCT 30 Creatinine 0.77  Sees Mark Morris and Mark Morris Still with chest congestion  Weight was 222 on d/c Now at home 217 Still feels like she has some congestion, cough, not much Afraid pneumonia might come back, asking for Levaquin just to make sure  Previously withStress at home New neighbors, making noise,  smoke one pack per day. Reports having chronic shortness of breath on exertion  Previous lab work,Hemoglobin A1c 5.9, total cholesterol 118, LDL 71 Continues on simvastatin 20 mg daily  EKG shows normal sinus rhythm with rate 103bpm, nonspecific QRS widening, left anterior fascicular block  Other past medical history Prior history of chronic back pain  Previous stress test showing ischemia in the inferior wall. Ejection fraction 40%.  He had cardiac catheterization on 03/01/2014. This showed mild proximal LAD disease, otherwise mild luminal irregularities. Mild to moderate global hypokinesis. Ejection fraction 35-45%. Significant ectopy noted, APCs. Etiology of his depressed ejection fraction is unclear. He denies alcohol. Unable to exclude sleep apnea  History of carpal tunnel surgery on the left  lab work shows normal ttal testosterone, low free testosterone   PMH:   has a past medical history of Allergy (2002); Anxiety (08/24/1999); Arthritis; BPH (benign prostatic hyperplasia); CAD (coronary artery disease); CHF (congestive heart failure) (HCC); COPD (chronic obstructive pulmonary disease) ([place]); DDD (degenerative disc disease), lumbosacral; Depression (08/24/1999); Diabetes mellitus (08/24/2003); Dysrhythmia (2013); Enteritis (10/18/2004); Hearing difficulty of both ears; HNP (herniated nucleus pulposus), lumbar; Hyperlipidemia (08/23/1997); Hypertension (1980); Paroxysmal SVT (supraventricular tachycardia) ([place]); PNA (pneumonia); Post-operative infection (2006); Shortness of breath; Sigmoid polyp (07/03/2007); Sleep apnea; and Tobacco abuse.  PSH:    Past Surgical History:  Procedure Laterality Date  . ANKLE FRACTURE SURGERY  02/2003  . APPENDECTOMY    . BACK SURGERY  2010  . CARDIAC CATHETERIZATION  01/14/2003   Min plaque EF65%// Cath normal (Mark Morris 2000)//ECHO Mild AAA TrMR Ef61% 05/30/2002 (Mark Morris)  . CARDIAC CATHETERIZATION  2015   [facility]  . CARPAL TUNNEL RELEASE Bilateral   . CHOLECYSTECTOMY    . COLONOSCOPY    . GASTRIC BYPASS  11/24/2004  . GASTRIC FUNDOPLICATION  3235   [facility]  . HERNIA REPAIR  01/02/2006   right ventral Hernia Repair Laparascopic Mark Morris  . KNEE ARTHROSCOPY W/ MENISCAL REPAIR Right   . MAXIMUM ACCESS (MAS)POSTERIOR LUMBAR INTERBODY FUSION (  PLIF) 2 LEVEL N/A 04/12/2014   Procedure: Lumbar Four-Five Lumbar Five-Sacral One Redo Laminectomy and Posterior Lumbar  Interbody Fusion;  Surgeon: Mark Levine, MD;  Location: [place] [facility] OR[facility];  Service: Neurosurgery;  Laterality: N/A;  Lumbar Four-Five Lumbar Five-Sacral One Redo Laminectomy and Posterior Lumbar Interbody Fusion  . sleep study  03/2004   Sleep Study Hypopneas 60/hr ; Kicks 160/hr    Current Outpatient Prescriptions  Medication Sig Dispense Refill  . albuterol (PROVENTIL HFA;VENTOLIN HFA) 108 (90 Base) MCG/ACT inhaler Inhale 2 puffs into the lungs every 6 (six) hours as needed for wheezing or shortness of breath (fill with proair inhaler). 1 Inhaler 2  . ALPRAZolam (XANAX) 0.5 MG tablet TAKE ONE TABLET BY MOUTH TWICE DAILY AS NEEDED 60 tablet 2  . Blood Glucose Monitoring Suppl (ONE TOUCH ULTRA SYSTEM KIT) W/DEVICE KIT Check blood sugar once daily and as directed. Ex E11.9 1 each 0  . budesonide-formoterol (SYMBICORT) 160-4.5 MCG/ACT inhaler Inhale 1 puff into the lungs 2 (two) times daily. Rinse after use 3 Inhaler 3  . calcium elemental as carbonate (CALCIUM ANTACID ULTRA) 400 MG tablet Chew 1,000 mg by mouth daily.      . carvedilol (COREG) 6.25 MG tablet Take 1 tablet (6.25 mg total) by mouth 2 (two) times daily. 180 tablet 3  . cyanocobalamin (,VITAMIN B-12,) 1000 MCG/ML injection Inject 1,000 mcg into the muscle every 14 (fourteen) days.    . cyclobenzaprine (FLEXERIL) 10 MG tablet TAKE ONE-HALF TO ONE TABLET BY MOUTH TWICE DAILY AS NEEDED FOR MUSCLE SPASM (CAUSES SEDATION) 60 tablet 3  . doxazosin (CARDURA) 2 MG tablet TAKE ONE TABLET BY MOUTH ONCE DAILY 90 tablet 3  . ferrous sulfate 325 (65 FE) MG tablet Take 1 tablet (325 mg total) by mouth daily with breakfast. 100 tablet 3  . finasteride (PROSCAR) 5 MG tablet Take 1 tablet (5 mg total) by mouth daily. 90 tablet 3  . FLUoxetine (PROZAC) 40 MG capsule Take 1 capsule (40 mg total) by mouth daily. 90 capsule 3  . fluticasone (FLONASE) 50 MCG/ACT nasal spray Place 2 sprays into both nostrils daily. 16 g 12  . furosemide (LASIX) 40 MG tablet  Take 1 tablet (40 mg total) by mouth 2 (two) times daily. 30 tablet 0  . glucose blood (ONE TOUCH ULTRA TEST) test strip Use as instructed to test blood sugar 1-2 times daily.  Diagnosis:E11.9.  History of high and low sugars, with need for mult testing per day 200 each 3  . guaiFENesin (MUCINEX) 600 MG 12 hr tablet Take 1 tablet (600 mg total) by mouth 2 (two) times daily.    Marland Kitchen levofloxacin (LEVAQUIN) 750 MG tablet Take 1 tablet (750 mg total) by mouth daily. 5 tablet 0  . metFORMIN (GLUCOPHAGE) 500 MG tablet Take 2 tablets (1,000 mg total) by mouth 2 (two) times daily with a meal.    . Multiple Vitamin (MULTIVITAMIN) tablet Take 1 tablet by mouth daily.      . ONE TOUCH ULTRA TEST test strip USE STRIP AS INSTRUCTED TO TEST BLOOD SUGAR ONCE DAILY. 50 each 15  . ONETOUCH DELICA LANCETS FINE MISC check blood sugar once daily and as directed.E11.9 100 each 3  . potassium chloride (K-DUR) 10 MEQ tablet Take 3 tablets (30 mEq total) by mouth daily. 270 tablet 3  . predniSONE (DELTASONE) 10 MG tablet Take 1 tablet (10 mg total) by mouth daily with breakfast. 60 mg PO (ORAL) x 2 days 50 mg PO (ORAL)  x  2 days 40 mg PO (ORAL)  x 2 days 30 mg PO  (ORAL)  x 2 days 20 mg PO  (ORAL) x 2 days 10 mg PO  (ORAL) x 2 days then stop 42 tablet 0  . rOPINIRole (REQUIP) 1 MG tablet Take 1 tablet (1 mg total) by mouth at bedtime. 90 tablet 3  . sacubitril-valsartan (ENTRESTO) 49-51 MG Take 1 tablet by mouth 2 (two) times daily. 60 tablet 6  . simvastatin (ZOCOR) 20 MG tablet TAKE ONE TABLET BY MOUTH ONCE DAILY 90 tablet 3  . tiotropium (SPIRIVA HANDIHALER) 18 MCG inhalation capsule Place 1 capsule (18 mcg total) into inhaler and inhale daily. 90 capsule 3  . traZODone (DESYREL) 50 MG tablet TAKE ONE-HALF TO ONE TABLET BY MOUTH AT BEDTIME AS NEEDED FOR SLEEP 90 tablet 3   No current facility-administered medications for this visit.      Allergies:   Codeine and Sulfonamide derivatives   Social History:  The  patient  reports that he has been smoking Cigarettes.  He has a 10.00 pack-year smoking history. He has never used smokeless tobacco. He reports that he does not drink alcohol or use drugs.   Family History:   family history includes Alcohol abuse in his brother; Alcohol abuse (age of onset: 98) in his brother; Diabetes in his brother, brother, and mother; Heart disease (age of onset: 38) in his mother; Heart disease (age of onset: 39) in his father; Hypertension in his father.    Review of Systems: Review of Systems  Constitutional: Negative.   Respiratory: Positive for cough and shortness of breath.   Cardiovascular: Negative.   Gastrointestinal: Negative.   Musculoskeletal: Negative.   Neurological: Negative.   Psychiatric/Behavioral: Negative.   All other systems reviewed and are negative.    PHYSICAL EXAM: VS:  BP 124/64 (BP Location: Left Arm, Patient Position: Sitting, Cuff Size: Normal)   Pulse (!) 103   Ht _0  (1.803 m)   Wt 217 lb 4 oz (98.5 kg)   BMI 30.30 kg/m  , BMI Body mass index is 30.3 kg/m. GEN: Well nourished, well developed, in no acute distress  HEENT: normal  Neck: no JVD, carotid bruits, or masses Cardiac: RRR; no murmurs, rubs, or gallops,no edema  Respiratory:  Clear with moderately decreased breath sounds, mild dullness at the bases, normal work of breathing GI: soft, nontender, nondistended, + BS MS: no deformity or atrophy  Skin: warm and dry, no rash Neuro:  Strength and sensation are intact Psych: euthymic mood, full affect    Recent Labs: 11/18/2016: B Natriuretic Peptide 600.0; TSH 1.396 11/19/2016: BUN 16; Creatinine, Ser 0.77; Hemoglobin 9.1; Magnesium 2.0; Platelets 305; Potassium 3.6; Sodium 141    Lipid Panel Lab Results  Component Value Date   CHOL 181 08/13/2015   HDL 52.50 08/13/2015   LDLCALC 107 (H) 08/13/2015   TRIG 108.0 08/13/2015      Wt Readings from Last 3 Encounters:  11/25/16 217 lb 4 oz (98.5 kg)  11/19/16 224  lb (101.6 kg)  10/26/16 223 lb (101.2 kg)       ASSESSMENT AND PLAN:  Mixed hyperlipidemia Cholesterol checked through nephrology Cholesterol is at goal on the current lipid regimen. No changes to the medications were made.  Essential hypertension Blood pressure is well controlled on today's visit.  Increased carvedilol up to 12.5 mg twice a day for tachycardia  Coronary artery disease involving native coronary artery of native heart without angina pectoris Currently with no  symptoms of angina. No further workup at this time. Continue current medication regimen.  Acute on chronic systolic heart failure ([place]) Recommended he stay on his current regiment with increase of Coreg Consider BMP in follow-up with Mark Morris Still feels congested  Diabetes mellitus without complication [facility]) We have encouraged continued exercise, careful diet management in an effort to lose weight.  Tobacco abuse We have encouraged him to continue to work on weaning his cigarettes and smoking cessation. He will continue to work on this and does not want any assistance with chantix.   Centrilobular emphysema (HCC) Recent COPD exacerbation, on prednisone taper, feeling better Do not think he needs more antibiotics as lungs are relatively clear, no coughing on his visit today  Iron deficiency anemia, unspecified iron deficiency anemia type Recommended he take iron daily  SOB (shortness of breath) Breathing better, likely multifactorial including systolic CHF, COPD, pulmonary hypertension  Long discussion with him concerning recent testing, results, hospitalization Hospital records reviewed  Total encounter time more than 45 minutes  Greater than 50% was spent in counseling and coordination of care with the patient   Disposition:   F/U  2 months  No orders of the defined types were placed in this encounter.    Signed, Esmond Plants, M.D., Ph.D. 11/25/2016  [place],  [place]

## 2016-11-25 ENCOUNTER — Ambulatory Visit (INDEPENDENT_AMBULATORY_CARE_PROVIDER_SITE_OTHER): Payer: Medicare Other | Admitting: Cardiovascular Disease

## 2016-11-25 ENCOUNTER — Encounter: Payer: Self-pay | Admitting: Cardiovascular Disease

## 2016-11-25 VITALS — BP 124/64 | HR 103 | Ht 71.0 in | Wt 217.2 lb

## 2016-11-25 DIAGNOSIS — R0602 Shortness of breath: Secondary | ICD-10-CM

## 2016-11-25 DIAGNOSIS — I10 Essential (primary) hypertension: Secondary | ICD-10-CM

## 2016-11-25 DIAGNOSIS — E782 Mixed hyperlipidemia: Secondary | ICD-10-CM

## 2016-11-25 DIAGNOSIS — E119 Type 2 diabetes mellitus without complications: Secondary | ICD-10-CM

## 2016-11-25 DIAGNOSIS — D509 Iron deficiency anemia, unspecified: Secondary | ICD-10-CM

## 2016-11-25 DIAGNOSIS — Z720 Tobacco use: Secondary | ICD-10-CM

## 2016-11-25 DIAGNOSIS — I2510 Atherosclerotic heart disease of native coronary artery without angina pectoris: Secondary | ICD-10-CM | POA: Diagnosis not present

## 2016-11-25 DIAGNOSIS — I5023 Acute on chronic systolic (congestive) heart failure: Secondary | ICD-10-CM | POA: Diagnosis not present

## 2016-11-25 DIAGNOSIS — J432 Centrilobular emphysema: Secondary | ICD-10-CM

## 2016-11-25 MED ORDER — CARVEDILOL 12.5 MG PO TABS: 12.5000 mg | ORAL_TABLET | Freq: Two times a day (BID) | ORAL | 3 refills | Status: AC

## 2016-11-25 MED ORDER — SACUBITRIL-VALSARTAN 49-51 MG PO TABS: 1.0000 | ORAL_TABLET | Freq: Two times a day (BID) | ORAL | 6 refills | Status: AC

## 2016-11-25 MED ORDER — NITROGLYCERIN 0.4 MG SL SUBL: 0.4000 mg | SUBLINGUAL_TABLET | SUBLINGUAL | 3 refills | Status: AC | PRN

## 2016-11-25 MED ORDER — POTASSIUM CHLORIDE ER 10 MEQ PO TBCR: 30.0000 meq | EXTENDED_RELEASE_TABLET | Freq: Every day | ORAL | 3 refills | Status: AC

## 2016-11-25 MED ORDER — FUROSEMIDE 40 MG PO TABS: 40.0000 mg | ORAL_TABLET | Freq: Two times a day (BID) | ORAL | 3 refills | Status: DC

## 2016-11-25 NOTE — Patient Instructions (Addendum)
Medication Instructions:   Please increase the coreg up to 12.5 mg twice a day  Labwork:  No new labs needed  Testing/Procedures:  No further testing at this time   I recommend watching educational videos on topics of interest to you at:       [email or web address]  Enter code: [email or web address]    Follow-Up: It was a pleasure seeing you in the office today. Please call us if you have new issues that need to be addressed before your next appt.  [phone]  Your physician wants you to follow-up in: 2 months.   If you need a refill on your cardiac medications before your next appointment, please call your pharmacy.

## 2016-11-30 ENCOUNTER — Ambulatory Visit (INDEPENDENT_AMBULATORY_CARE_PROVIDER_SITE_OTHER): Payer: Medicare Other | Admitting: Family Medicine

## 2016-11-30 ENCOUNTER — Other Ambulatory Visit: Payer: Self-pay | Admitting: Family Medicine

## 2016-11-30 ENCOUNTER — Encounter: Payer: Self-pay | Admitting: Family Medicine

## 2016-11-30 VITALS — BP 120/62 | HR 95 | Temp 98.0°F | Wt 222.5 lb

## 2016-11-30 DIAGNOSIS — D649 Anemia, unspecified: Secondary | ICD-10-CM | POA: Diagnosis not present

## 2016-11-30 DIAGNOSIS — I420 Dilated cardiomyopathy: Secondary | ICD-10-CM | POA: Diagnosis not present

## 2016-11-30 DIAGNOSIS — E119 Type 2 diabetes mellitus without complications: Secondary | ICD-10-CM | POA: Diagnosis not present

## 2016-11-30 DIAGNOSIS — E782 Mixed hyperlipidemia: Secondary | ICD-10-CM

## 2016-11-30 DIAGNOSIS — D509 Iron deficiency anemia, unspecified: Secondary | ICD-10-CM

## 2016-11-30 DIAGNOSIS — R918 Other nonspecific abnormal finding of lung field: Secondary | ICD-10-CM

## 2016-11-30 DIAGNOSIS — J432 Centrilobular emphysema: Secondary | ICD-10-CM | POA: Diagnosis not present

## 2016-11-30 DIAGNOSIS — I712 Thoracic aortic aneurysm, without rupture: Secondary | ICD-10-CM

## 2016-11-30 DIAGNOSIS — I7121 Aneurysm of the ascending aorta, without rupture: Secondary | ICD-10-CM

## 2016-11-30 LAB — COMPREHENSIVE METABOLIC PANEL
ALK PHOS: 78 U/L (ref 39–117)
ALT: 102 U/L — AB (ref 0–53)
AST: 61 U/L — ABNORMAL HIGH (ref 0–37)
Albumin: 4.1 g/dL (ref 3.5–5.2)
BILIRUBIN TOTAL: 1.5 mg/dL — AB (ref 0.2–1.2)
BUN: 17 mg/dL (ref 6–23)
CALCIUM: 8.6 mg/dL (ref 8.4–10.5)
CO2: 34 mEq/L — ABNORMAL HIGH (ref 19–32)
CREATININE: 0.95 mg/dL (ref 0.40–1.50)
Chloride: 100 mEq/L (ref 96–112)
GFR: 84.93 mL/min (ref >60.00)
GLUCOSE: 127 mg/dL — AB (ref 70–99)
Potassium: 3.8 mEq/L (ref 3.5–5.1)
Sodium: 141 mEq/L (ref 135–145)
TOTAL PROTEIN: 6.4 g/dL (ref 6.0–8.3)

## 2016-11-30 LAB — LIPID PANEL
Cholesterol: 190 mg/dL (ref 0–200)
HDL: 65.8 mg/dL (ref >39.00)
LDL Cholesterol: 100 mg/dL — ABNORMAL HIGH (ref 0–99)
NonHDL: 124.4
Total CHOL/HDL Ratio: 3
Triglycerides: 122 mg/dL (ref 0.0–149.0)
VLDL: 24.4 mg/dL (ref 0.0–40.0)

## 2016-11-30 LAB — IBC PANEL
Iron: 100 ug/dL (ref 42–165)
SATURATION RATIOS: 21 % (ref 20.0–50.0)
TRANSFERRIN: 340 mg/dL (ref 212.0–360.0)

## 2016-11-30 MED ORDER — FUROSEMIDE 40 MG PO TABS: 40.0000 mg | ORAL_TABLET | Freq: Two times a day (BID) | ORAL | 1 refills | Status: AC

## 2016-11-30 MED ORDER — SIMVASTATIN 20 MG PO TABS: 20.0000 mg | ORAL_TABLET | Freq: Every day | ORAL | 3 refills | Status: AC

## 2016-11-30 MED ORDER — ALPRAZOLAM 0.5 MG PO TABS: 0.5000 mg | ORAL_TABLET | Freq: Two times a day (BID) | ORAL | 2 refills | Status: AC | PRN

## 2016-11-30 NOTE — Progress Notes (Signed)
Rx for Alprazolam phoned in to pharmacy as requested by Dr. Damita Dunnings at [facility].  Mike Craze, CMA

## 2016-11-30 NOTE — Progress Notes (Signed)
ADMISSION DIAGNOSIS:  SOB (shortness of breath) [R06.02] Exertional dyspnea [R06.09] Unstable chest pain due to insufficient blood supply to heart (HCC) [I20.0]  DISCHARGE DIAGNOSIS:  Active Problems:   Congestive dilated cardiomyopathy (HCC)   SOB (shortness of breath)   Acute on chronic systolic heart failure (HCC)   Nonischemic cardiomyopathy ([place])   SECONDARY DIAGNOSIS:       Past Medical History:  Diagnosis Date  . Allergy 2002   allergic rhinitis  . Anxiety 08/24/1999  . Arthritis   . BPH (benign prostatic hyperplasia)   . CAD (coronary artery disease)    a. 2004: Non-obstructive CAD with Proximal RCA 25%, EF 65%;  b. Myoview 12/05: no scar or ischemia, EF 58% c. 02/2014: cath showing 30% pLAD disease with mild luminal irregularities, EF reduced to 35-45%  . COPD (chronic obstructive pulmonary disease) ([place])   . DDD (degenerative disc disease), lumbosacral   . Depression 08/24/1999  . Diabetes mellitus 08/24/2003   typeII  . Dysrhythmia 2013   h/o SVT   . Enteritis 10/18/2004   CT abd. Enteritis//CT pelvis Sacroiliac Spurring  . Hearing difficulty of both ears   . HNP (herniated nucleus pulposus), lumbar   . Hyperlipidemia 08/23/1997  . Hypertension 1980  . Paroxysmal SVT (supraventricular tachycardia) (HCC)    History of  . Post-operative infection 2006   after gastric bypass for weight loss  . Shortness of breath    exertional  . Sigmoid polyp 07/03/2007   colonoscopy Small internal hemorrhoids; Sigmoid polyp B9 (Dr. Elliott)//colonoscopy normal 04/06/2004  . Sleep apnea    does not use C-pap ; study done >10 years ago  . Tobacco abuse     HOSPITAL COURSE:   66 year old male with a history of nonobstructive coronary artery disease with ejection fraction of 35-45%, diabetes and BPH who presents with shortness of breath.  1. Acute on chronic systolic heart failure: CT scan showed evidence of mild CHF. He was diuresed and is  asymptomatic currently He is referred to CHF clinic and follow-up with his cardiologist within 1 week. He will continue Entresto  2. Community-acquired pneumonia: Patient was initiated on Rocephin and azithromycin and discharged on Levaquin.  3. Acute on chronic COPD exacerbation: Patient symptoms have improved. He will continue inhalers and will be discharged on prednisone taper  4. Diabetes: Patient will continue outpatient medications  5. Hyperlipidemia: Continue statin  6. History of sleep apnea patient does not use CPAP  7. .4 x 4.6 cm ascending aortic aneurysm. Ascending thoracic aortic aneurysm. He will have follow up with Dr Lucky Cowboy and it is Recommended to have  semi-annual imaging followup by CTA or MRA. DISCHARGE CONDITIONS AND DIET:   Stable for discharge on heart healthy diabetic diet   ================================== Multiple issues from hospitalization discussed with patient. See above. Inpatient course discussed with patient.  CHF exacerbation, complicated by anemia. Also with concurrent pneumonia/COPD.  He isn't SOB, he feels better than prev, "a whole lot better."  No CP.  No wheeze.  No BLE edema.  Mildly lightheaded in the meantime, started in the last few two days.  No vomiting.  Some diarrhea at baseline.  No fevers.  He is done with pred taper and antibiotics.  Anemia. He is still on iron. Recheck iron level today. He has been off meloxicam in the meantime.  dw pt, I want him to stay off meloxicam at least until he can see GI.  Diabetes history noted. Now off oral steroids.  HLD.  Recheck lipids  pending.   Aortic changes noted, he is going to f/u with cardiology. D/w pt.    Pulmonary nodules noted.  D/w pt.  Multiple sub solid less than 6 mm pulmonary nodules. Recommend follow-up CT at 3.  Discussed with patient about pulmonary nodules, pathophysiology and management with follow-up imaging.  PMH and SH reviewed  ROS: Per HPI unless specifically  indicated in ROS section   Meds, vitals, and allergies reviewed.   GEN: nad, alert and oriented HEENT: mucous membranes moist NECK: supple w/o LA CV: rrr PULM: ctab, no inc wob ABD: soft, +bs EXT: no edema SKIN: no acute rash

## 2016-11-30 NOTE — Telephone Encounter (Signed)
Received refill electronically Last refill 07/22/16 #60/2 Last office visit 10/26/16

## 2016-11-30 NOTE — Patient Instructions (Addendum)
Go to the lab on the way out.  We'll contact you with your lab report.  We'll get your refills sent in.  Keep the GI appointment with Dr. Allen Norris.  Hold meloxicam at least until you see Dr. Allen Norris.   We'll get repeat CT done in about 3 months about the pulmonary nodules.  I'll check with cardiology about monitoring your aorta.  Take care.  Glad to see you.

## 2016-12-01 ENCOUNTER — Encounter: Payer: Self-pay | Admitting: Family Medicine

## 2016-12-01 DIAGNOSIS — R918 Other nonspecific abnormal finding of lung field: Secondary | ICD-10-CM | POA: Insufficient documentation

## 2016-12-01 DIAGNOSIS — I7121 Aneurysm of the ascending aorta, without rupture: Secondary | ICD-10-CM | POA: Insufficient documentation

## 2016-12-01 DIAGNOSIS — I712 Thoracic aortic aneurysm, without rupture: Secondary | ICD-10-CM | POA: Insufficient documentation

## 2016-12-01 NOTE — Assessment & Plan Note (Signed)
Much improved now status post exacerbation. Off oral steroids. Continue current medications.

## 2016-12-01 NOTE — Assessment & Plan Note (Signed)
I will check with cardiology about follow-up. My understanding is that cardiology is following the patient for this, according to the patient.

## 2016-12-01 NOTE — Assessment & Plan Note (Addendum)
With recent exacerbation, continue current medications. Check labs today. See notes on labs. At this point still okay for outpatient follow-up.

## 2016-12-01 NOTE — Assessment & Plan Note (Signed)
He isn't short of breath. Okay for outpatient follow-up with GI, in the near future. See notes on labs.

## 2016-12-01 NOTE — Assessment & Plan Note (Addendum)
Multiple sub solid less than 6 mm pulmonary nodules. Recommend follow-up CT at 3.  Discussed with patient about pulmonary nodules, pathophysiology and management with follow-up imaging.>40 minutes spent in face to face time with patient, >50% spent in counselling or coordination of care.

## 2016-12-01 NOTE — Assessment & Plan Note (Signed)
Continue as is. Now off oral prednisone.

## 2016-12-01 NOTE — Telephone Encounter (Signed)
Second attempt to contact patient to set up new patient appointment. Message left.

## 2016-12-01 NOTE — Assessment & Plan Note (Signed)
See notes on labs. 

## 2016-12-02 ENCOUNTER — Encounter: Payer: Self-pay | Admitting: Gastroenterology

## 2016-12-02 ENCOUNTER — Other Ambulatory Visit: Payer: Self-pay

## 2016-12-02 ENCOUNTER — Telehealth: Payer: Self-pay | Admitting: Cardiovascular Disease

## 2016-12-02 ENCOUNTER — Ambulatory Visit (INDEPENDENT_AMBULATORY_CARE_PROVIDER_SITE_OTHER): Payer: Medicare Other | Admitting: Gastroenterology

## 2016-12-02 VITALS — BP 131/70 | HR 94 | Temp 98.4°F | Ht 71.0 in | Wt 222.0 lb

## 2016-12-02 DIAGNOSIS — R748 Abnormal levels of other serum enzymes: Secondary | ICD-10-CM | POA: Diagnosis not present

## 2016-12-02 DIAGNOSIS — D500 Iron deficiency anemia secondary to blood loss (chronic): Secondary | ICD-10-CM | POA: Diagnosis not present

## 2016-12-02 NOTE — Progress Notes (Signed)
Gastroenterology Consultation  Referring Provider:     Tonia Ghent, MD Primary Care Physician:  Elsie Stain, MD Primary Gastroenterologist:  Dr. Allen Norris     Reason for Consultation:     Iron deficiency anemia        HPI:   Mark Morris is a 66 y.o. y/o male referred for consultation & management of Iron deficiency anemia by Dr. Elsie Stain, MD.  This patient comes in today with a finding of iron deficiency anemia. The patient reports that he had a colonoscopy in the past and is overdue for a repeat colonoscopy because she had polyps at that time. The patient denies any unexplained weight loss but has had diarrhea since having a gastric bypass surgery many years ago. There is no report of any black stools or bloody stools. The patient also denies any dysphagia or early satiety. The patient was started on iron and he states that he has felt better and his labs have shown that his iron studies have come up. Is no family history of colon cancer colon polyps. The patient was also noted to have abnormal liver enzymes. His liver enzymes showed his AST of 61 with his ALT of 201 and his total bilirubin 1.5. The patient denies any alcohol abuse. He also had a hepatitis C blood test that was negative.  Past Medical History:  Diagnosis Date  . Allergy 2002   allergic rhinitis  . Anxiety 08/24/1999  . Arthritis   . Ascending aortic aneurysm ([place])   . BPH (benign prostatic hyperplasia)   . CAD (coronary artery disease)    a. 2004: Non-obstructive CAD with Proximal RCA 25%, EF 65%;  b. Myoview 12/05: no scar or ischemia, EF 58% c. 02/2014: cath showing 30% pLAD disease with mild luminal irregularities, EF reduced to 35-45%  . CHF (congestive heart failure) ([place])   . COPD (chronic obstructive pulmonary disease) ([place])   . DDD (degenerative disc disease), lumbosacral   . Depression 08/24/1999  . Diabetes mellitus 08/24/2003   typeII  . Dysrhythmia 2013   h/o SVT   . Enteritis 10/18/2004   CT abd. Enteritis//CT pelvis Sacroiliac Spurring  . Hearing difficulty of both ears   . HNP (herniated nucleus pulposus), lumbar   . Hyperlipidemia 08/23/1997  . Hypertension 1980  . Paroxysmal SVT (supraventricular tachycardia) ([facility])    History of  . PNA (pneumonia)   . Post-operative infection 2006   after gastric bypass for weight loss  . Pulmonary nodules   . Shortness of breath    exertional  . Sigmoid polyp 07/03/2007   colonoscopy Small internal hemorrhoids; Sigmoid polyp B9 (Dr. Elliott)//colonoscopy normal 04/06/2004  . Sleep apnea    does not use C-pap ; study done >10 years ago  . Tobacco abuse     Past Surgical History:  Procedure Laterality Date  . ANKLE FRACTURE SURGERY  02/2003  . APPENDECTOMY    . BACK SURGERY  2010  . CARDIAC CATHETERIZATION  01/14/2003   Min plaque EF65%// Cath normal (Dr. Clayborn Bigness 2000)//ECHO Mild AAA TrMR Ef61% 05/30/2002 (Dr.Callwood)  . CARDIAC CATHETERIZATION  2015   [facility]  . CARPAL TUNNEL RELEASE Bilateral   . CHOLECYSTECTOMY    . COLONOSCOPY    . GASTRIC BYPASS  11/24/2004  . GASTRIC FUNDOPLICATION  1610   [facility]  . HERNIA REPAIR  01/02/2006   right ventral Hernia Repair Laparascopic [facility] Pai  . KNEE ARTHROSCOPY W/ MENISCAL REPAIR Right   . MAXIMUM ACCESS (MAS)POSTERIOR LUMBAR  INTERBODY FUSION (PLIF) 2 LEVEL N/A 04/12/2014   Procedure: Lumbar Four-Five Lumbar Five-Sacral One Redo Laminectomy and Posterior Lumbar Interbody Fusion;  Surgeon: Erline Levine, MD;  Location: [facility];  Service: Neurosurgery;  Laterality: N/A;  Lumbar Four-Five Lumbar Five-Sacral One Redo Laminectomy and Posterior Lumbar Interbody Fusion  . sleep study  03/2004   Sleep Study Hypopneas 60/hr ; Kicks 160/hr    Prior to Admission medications   Medication Sig Start Date End Date Taking? Authorizing Provider  albuterol (PROVENTIL HFA;VENTOLIN HFA) 108 (90 Base) MCG/ACT inhaler Inhale 2 puffs into the lungs every 6 (six) hours as needed for wheezing  or shortness of breath (fill with proair inhaler). 10/27/16  Yes Tonia Ghent, MD  ALPRAZolam Duanne Moron) 0.5 MG tablet Take 1 tablet (0.5 mg total) by mouth 2 (two) times daily as needed. 11/30/16  Yes Tonia Ghent, MD  Blood Glucose Monitoring Suppl (ONE TOUCH ULTRA SYSTEM KIT) W/DEVICE KIT Check blood sugar once daily and as directed. Ex E11.9 07/05/14  Yes Tonia Ghent, MD  budesonide-formoterol [facility]) 160-4.5 MCG/ACT inhaler Inhale 1 puff into the lungs 2 (two) times daily. Rinse after use 10/26/16  Yes Tonia Ghent, MD  calcium elemental as carbonate (CALCIUM ANTACID ULTRA) 400 MG tablet Chew 1,000 mg by mouth daily.     Yes Historical Provider, MD  carvedilol (COREG) 12.5 MG tablet Take 1 tablet (12.5 mg total) by mouth 2 (two) times daily. 11/25/16  Yes Minna Merritts, MD  cyanocobalamin (,VITAMIN B-12,) 1000 MCG/ML injection Inject 1,000 mcg into the muscle every 14 (fourteen) days. 09/07/16  Yes Historical Provider, MD  cyclobenzaprine (FLEXERIL) 10 MG tablet TAKE ONE-HALF TO ONE TABLET BY MOUTH TWICE DAILY AS NEEDED FOR MUSCLE SPASM (CAUSES SEDATION) 07/22/16  Yes Tonia Ghent, MD  doxazosin (CARDURA) 2 MG tablet TAKE ONE TABLET BY MOUTH ONCE DAILY 08/31/16  Yes Tonia Ghent, MD  ferrous sulfate 325 (65 FE) MG tablet Take 1 tablet (325 mg total) by mouth daily with breakfast. 11/09/16  Yes Tonia Ghent, MD  finasteride (PROSCAR) 5 MG tablet Take 1 tablet (5 mg total) by mouth daily. 10/26/16  Yes Tonia Ghent, MD  FLUoxetine (PROZAC) 40 MG capsule Take 1 capsule (40 mg total) by mouth daily. 10/26/16  Yes Tonia Ghent, MD  fluticasone (FLONASE) 50 MCG/ACT nasal spray Place 2 sprays into both nostrils daily. 08/22/15  Yes Tonia Ghent, MD  furosemide (LASIX) 40 MG tablet Take 1 tablet (40 mg total) by mouth 2 (two) times daily. 11/30/16  Yes Tonia Ghent, MD  glucose blood (ONE TOUCH ULTRA TEST) test strip Use as instructed to test blood sugar 1-2 times daily.   Diagnosis:E11.9.  History of high and low sugars, with need for mult testing per day 02/27/15  Yes Tonia Ghent, MD  guaiFENesin (MUCINEX) 600 MG 12 hr tablet Take 1 tablet (600 mg total) by mouth 2 (two) times daily. 02/27/15  Yes Tonia Ghent, MD  metFORMIN (GLUCOPHAGE) 500 MG tablet Take 2 tablets (1,000 mg total) by mouth 2 (two) times daily with a meal. 10/26/16  Yes Tonia Ghent, MD  Multiple Vitamin (MULTIVITAMIN) tablet Take 1 tablet by mouth daily.     Yes Historical Provider, MD  nitroGLYCERIN (NITROSTAT) 0.4 MG SL tablet Place 1 tablet (0.4 mg total) under the tongue every 5 (five) minutes as needed for chest pain. 11/25/16 12/20/16 Yes Minna Merritts, MD  ONE TOUCH ULTRA TEST test strip  USE STRIP AS INSTRUCTED TO TEST BLOOD SUGAR ONCE DAILY. 04/23/16  Yes Tonia Ghent, MD  [facility] DELICA LANCETS FINE MISC check blood sugar once daily and as directed.E11.9 07/05/14  Yes Tonia Ghent, MD  potassium chloride (K-DUR) 10 MEQ tablet Take 3 tablets (30 mEq total) by mouth daily. 11/25/16  Yes Minna Merritts, MD  rOPINIRole (REQUIP) 1 MG tablet Take 1 tablet (1 mg total) by mouth at bedtime. 10/26/16  Yes Tonia Ghent, MD  sacubitril-valsartan (ENTRESTO) 49-51 MG Take 1 tablet by mouth 2 (two) times daily. 11/25/16  Yes Minna Merritts, MD  simvastatin (ZOCOR) 20 MG tablet Take 1 tablet (20 mg total) by mouth daily. 11/30/16  Yes Tonia Ghent, MD  tiotropium (SPIRIVA HANDIHALER) 18 MCG inhalation capsule Place 1 capsule (18 mcg total) into inhaler and inhale daily. 10/26/16  Yes Tonia Ghent, MD  traZODone (DESYREL) 50 MG tablet TAKE ONE-HALF TO ONE TABLET BY MOUTH AT BEDTIME AS NEEDED FOR SLEEP 10/26/16  Yes Tonia Ghent, MD    Family History  Problem Relation Age of Onset  . Heart disease Mother 53    CABG DM; Died due to Staph infection of sterotomy incision  . Diabetes Mother   . Hypertension Father   . Heart disease Father 63    pneumonia/CHF  . Alcohol abuse Brother 51     Has stopped at present  . Diabetes Brother   . Alcohol abuse Brother   . Diabetes Brother   . Colon cancer Neg Hx   . Prostate cancer Neg Hx      Social History  Substance Use Topics  . Smoking status: Current Every Day Smoker    Packs/day: 0.25    Years: 40.00    Types: Cigarettes  . Smokeless tobacco: Never Used     Comment: cutting down as of 06/2014, down to 1/2 PPD or less.   . Alcohol use No    Allergies as of 12/02/2016 - Review Complete 12/02/2016  Allergen Reaction Noted  . Codeine  05/10/2007  . Sulfonamide derivatives  05/10/2007    Review of Systems:    All systems reviewed and negative except where noted in HPI.   Physical Exam:  BP 131/70   Pulse 94   Temp 98.4 F (36.9 C) (Oral)   Ht 5' 11"  (1.803 m)   Wt 222 lb (100.7 kg)   BMI 30.96 kg/m  No LMP for male patient. Psych:  Alert and cooperative. Normal mood and affect. General:   Alert,  Well-developed, well-nourished, pleasant and cooperative in NAD Head:  Normocephalic and atraumatic. Eyes:  Sclera clear, no icterus.   Conjunctiva pink. Ears:  Normal auditory acuity. Nose:  No deformity, discharge, or lesions. Mouth:  No deformity or lesions,oropharynx pink & moist. Neck:  Supple; no masses or thyromegaly. Lungs:  Respirations even and unlabored.  Clear throughout to auscultation.   No wheezes, crackles, or rhonchi. No acute distress. Heart:  Regular rate and rhythm; no murmurs, clicks, rubs, or gallops. Abdomen:  Normal bowel sounds.  No bruits.  Soft, non-tender and non-distended without masses, hepatosplenomegaly or hernias noted.  No guarding or rebound tenderness.  Negative Carnett sign.   Rectal:  Deferred.  Msk:  Symmetrical without gross deformities.  Good, equal movement & strength bilaterally. Pulses:  Normal pulses noted. Extremities:  No clubbing or edema.  No cyanosis. Neurologic:  Alert and oriented x3;  grossly normal neurologically. Skin:  Intact without significant lesions or  rashes.  No jaundice. Lymph Nodes:  No significant cervical adenopathy. Psych:  Alert and cooperative. Normal mood and affect.  Imaging Studies: Dg Chest 2 View  Result Date: 11/18/2016 CLINICAL DATA:  Increasing congestion, shortness of breath and intermittent chest pain with exertion for approximately 2 weeks EXAM: CHEST  2 VIEW COMPARISON:  07/25/2013 FINDINGS: Minimal enlargement of cardiac silhouette. Mediastinal contours and pulmonary vascularity normal. Bronchitic changes with diffuse interstitial infiltrates/thickening in both lungs new since 2014, could represent atypical infection, edema, or interval development of interstitial lung disease. No segmental consolidation or pneumothorax. Tiny pleural effusions blunt the posterior costophrenic angles. Bones demineralized. IMPRESSION: Enlargement of cardiac silhouette. Bronchitic and new diffuse interstitial infiltrates and tiny pleural effusions, could represent pulmonary edema or atypical infection though interval development of interstitial lung disease is not excluded. Electronically Signed   By: Lavonia Dana M.D.   On: 11/18/2016 18:35   Ct Angio Chest Pe W Or Wo Contrast  Result Date: 11/18/2016 CLINICAL DATA:  Congestion, shortness of breath and intermittent chest pain with exertion for 2 weeks. Low hemoglobin. History of diabetes, hypertension, COPD. EXAM: CT ANGIOGRAPHY CHEST WITH CONTRAST TECHNIQUE: Multidetector CT imaging of the chest was performed using the standard protocol during bolus administration of intravenous contrast. Multiplanar CT image reconstructions and MIPs were obtained to evaluate the vascular anatomy. CONTRAST:  75 cc Isovue 370 COMPARISON:  Chest radiograph November 18, 2016 at 1756 hours FINDINGS: CARDIOVASCULAR: Adequate contrast opacification of the pulmonary artery's. Main pulmonary artery is not enlarged. No pulmonary arterial filling defects to the level of the segmental branches. Respiratory motion limits assessment  of subsegmental pulmonary branches. The heart is mildly enlarged. Moderate coronary artery calcifications. No pericardial effusion. Fusiform aneurysmal 4.4 x 4.6 cm ascending aorta. Mild calcific atherosclerosis of the aortic arch. MEDIASTINUM/NODES: 14 mm aortopulmonary window lymph node. Additional subcentimeter mediastinal and hilar lymph nodes. LUNGS/PLEURA: Tracheobronchial tree is patent, no pneumothorax. Diffuse bronchial wall thickening. Small bilateral pleural effusions. At least 5 scattered ground-glass nodules measuring up to 4 mm. Apical paraseptal emphysema. Moderate centrilobular emphysema edema. UPPER ABDOMEN: Included view of the abdomen is unremarkable. MUSCULOSKELETAL: Status post cholecystectomy, gastric bypass and abdominal herniorrhaphy. Colonic intra positioning. 15 mm cyst or possible hemangioma dome of the liver. Contrast refluxed into the intrahepatic IVC. Review of the MIP images confirms the above findings. IMPRESSION: No acute pulmonary embolism. RIGHT middle lobe atelectasis versus pneumonia. Mild cardiomegaly and suspected RIGHT heart failure. Small pleural effusions. Moderate emphysema. Bronchial wall thickening can be seen with bronchitis, pulmonary edema or reactive airway disease. **An incidental finding of potential clinical significance has been found. 4.4 x 4.6 cm ascending aortic aneurysm. Ascending thoracic aortic aneurysm. Recommend semi-annual imaging followup by CTA or MRA and referral to cardiothoracic surgery if not already obtained. This recommendation follows 2010 ACCF/AHA/AATS/ACR/ASA/SCA/SCAI/SIR/STS/SVM Guidelines for the Diagnosis and Management of Patients With Thoracic Aortic Disease. Circulation. 2010; 121: F751-W258** **An incidental finding of potential clinical significance has been found. Multiple sub solid less than 6 mm pulmonary nodules. Recommend follow-up CT at 3-6 months.** Electronically Signed   By: Elon Alas M.D.   On: 11/18/2016 20:33     Assessment and Plan:   DAVI KROON is a 66 y.o. y/o male who comes in with a history of iron deficiency anemia with a history of colon polyps. The patient has also had gastric bypass in the past. The patient will be set up for a EGD and colonoscopy. The patient will also be set up for right  upper quadrant ultrasound with blood sent off for other possible causes of his abnormal liver enzymes. The patient has been explained the plan and agrees with it.    Lucilla Lame, MD. Marval Regal   Note: This dictation was prepared with Dragon dictation along with smaller phrase technology. Any transcriptional errors that result from this process are unintentional.

## 2016-12-02 NOTE — Patient Instructions (Signed)
You are scheduled for a RUQ abdominal US at [facility] on Tuesday, April 17th @ 8:00am. You are to arrive at 7:45am and check in a the registration desk. You cannot have anything to eat or drink after midnight Monday night.

## 2016-12-02 NOTE — Telephone Encounter (Signed)
[facility] and they said prescription was available to be filled and will get it ready for the patient to pick up. Notified patient that pharmacy will be preparing the refill and he should be able to pick that up. He was appreciative for the call back and had no further questions at this time.

## 2016-12-02 NOTE — Telephone Encounter (Signed)
Patient called in stating that he has ran out of his fluid pills. Reviewed discharge instructions and it appears they only sent in 30 pills instead of 60 as directed. According to current med list there as a prescription sent in on 11/30/16 by Dr. Damita Dunnings to [facility] on [address] but patient states they will not fill it. Let patient know that I would call pharmacy to check on this for him and be in touch. He was appreciative for the call and had no further questions at this time.

## 2016-12-02 NOTE — Telephone Encounter (Signed)
Pt calling stating he was given fluid pills in hospital  He is not sure if he is to keep taking them He is stating if he is to  Then please call them in to the [facility] on [address]. Please advise.

## 2016-12-03 ENCOUNTER — Telehealth: Payer: Self-pay | Admitting: Gastroenterology

## 2016-12-03 ENCOUNTER — Other Ambulatory Visit
Admission: RE | Admit: 2016-12-03 | Discharge: 2016-12-03 | Disposition: A | Discharge status: Home / Self Care | Payer: Medicare Other | Source: Ambulatory Visit | Attending: Gastroenterology | Admitting: Gastroenterology

## 2016-12-03 DIAGNOSIS — R748 Abnormal levels of other serum enzymes: Secondary | ICD-10-CM | POA: Insufficient documentation

## 2016-12-03 LAB — HEPATIC FUNCTION PANEL
ALK PHOS: 82 U/L (ref 38–126)
ALT: 56 U/L (ref 17–63)
AST: 33 U/L (ref 15–41)
Albumin: 4.2 g/dL (ref 3.5–5.0)
BILIRUBIN INDIRECT: 1.7 mg/dL — AB (ref 0.3–0.9)
Bilirubin, Direct: 0.2 mg/dL (ref 0.1–0.5)
TOTAL PROTEIN: 7 g/dL (ref 6.5–8.1)
Total Bilirubin: 1.9 mg/dL — ABNORMAL HIGH (ref 0.3–1.2)

## 2016-12-03 NOTE — Telephone Encounter (Signed)
Patient LVM and is unable to have labs done at [facility] due to an outstanding balance. He wants to know if Dr. Allen Norris can use the labs his pcp ordered on Thursday, please cal patient.

## 2016-12-03 NOTE — Discharge Instructions (Signed)

## 2016-12-03 NOTE — Telephone Encounter (Signed)
Pt advised to go to [facility] to have labs completed.

## 2016-12-04 LAB — ANA W/REFLEX: Anti Nuclear Antibody(ANA): NEGATIVE

## 2016-12-04 LAB — HEPATITIS B SURFACE ANTIGEN: HEP B S AG: NEGATIVE

## 2016-12-04 LAB — HEPATITIS A ANTIBODY, TOTAL: HEP A TOTAL AB: POSITIVE — AB

## 2016-12-04 LAB — HEPATITIS B SURFACE ANTIBODY, QUANTITATIVE

## 2016-12-04 LAB — CERULOPLASMIN: Ceruloplasmin: 23 mg/dL (ref 16.0–31.0)

## 2016-12-05 ENCOUNTER — Telehealth: Payer: Self-pay | Admitting: Family Medicine

## 2016-12-05 LAB — ANTI-SMOOTH MUSCLE ANTIBODY, IGG: F-Actin IgG: 8 Units (ref 0–19)

## 2016-12-05 LAB — MITOCHONDRIAL ANTIBODIES: MITOCHONDRIAL M2 AB, IGG: 5.7 U (ref 0.0–20.0)

## 2016-12-05 NOTE — Telephone Encounter (Signed)
-----   Message from Minna Merritts, MD sent at 12/01/2016 10:36 PM EDT ----- We can track with period echo and CT scan chest Typically does not move very quickly 6 month follow up CT seems a little quick for follow up thx TG  ----- Message ----- From: Tonia Ghent, MD Sent: 12/01/2016  11:41 AM To: Minna Merritts, MD  Are you following his ascending aortic aneurysm? Please let me know if there is anything I need to do. Thanks.

## 2016-12-06 ENCOUNTER — Encounter: Payer: Self-pay | Admitting: *Deleted

## 2016-12-06 ENCOUNTER — Other Ambulatory Visit: Payer: Self-pay

## 2016-12-06 ENCOUNTER — Telehealth: Payer: Self-pay | Admitting: Gastroenterology

## 2016-12-06 LAB — ALPHA-1 ANTITRYPSIN PHENOTYPE: A1 ANTITRYPSIN SER: 116 mg/dL (ref 90–200)

## 2016-12-06 MED ORDER — NA SULFATE-K SULFATE-MG SULF 17.5-3.13-1.6 GM/177ML PO SOLN: 1.0000 | ORAL | 0 refills | Status: AC

## 2016-12-06 NOTE — Telephone Encounter (Signed)
[facility] on [address]  Patient needs his prep kit called in for his colonoscopy this week. °

## 2016-12-07 ENCOUNTER — Ambulatory Visit
Admission: RE | Admit: 2016-12-07 | Discharge: 2016-12-07 | Disposition: A | Discharge status: Home / Self Care | Payer: Medicare Other | Source: Ambulatory Visit | Attending: Gastroenterology | Admitting: Gastroenterology

## 2016-12-07 ENCOUNTER — Telehealth: Payer: Self-pay | Admitting: Gastroenterology

## 2016-12-07 ENCOUNTER — Encounter (INDEPENDENT_AMBULATORY_CARE_PROVIDER_SITE_OTHER): Payer: Self-pay | Admitting: Vascular Surgery

## 2016-12-07 DIAGNOSIS — R748 Abnormal levels of other serum enzymes: Secondary | ICD-10-CM | POA: Diagnosis not present

## 2016-12-07 DIAGNOSIS — R945 Abnormal results of liver function studies: Secondary | ICD-10-CM | POA: Diagnosis not present

## 2016-12-07 NOTE — Telephone Encounter (Signed)
12/07/16 Prior Authorization is not required through [facility] website for Colonoscopy & EGD for D50.0 & R74.8 Decision ID#: [id number]

## 2016-12-07 NOTE — Telephone Encounter (Signed)
Bowel prep solution sent to pharmacy per pt request.

## 2016-12-09 ENCOUNTER — Ambulatory Visit
Admission: RE | Admit: 2016-12-09 | Discharge: 2016-12-09 | Disposition: A | Discharge status: Home / Self Care | Payer: Medicare Other | Source: Ambulatory Visit | Attending: Gastroenterology | Admitting: Gastroenterology

## 2016-12-09 ENCOUNTER — Ambulatory Visit: Payer: Medicare Other | Admitting: Anesthesiology

## 2016-12-09 ENCOUNTER — Encounter
Admission: RE | Disposition: A | Discharge status: Home / Self Care | Payer: Self-pay | Source: Ambulatory Visit | Attending: Gastroenterology

## 2016-12-09 DIAGNOSIS — I509 Heart failure, unspecified: Secondary | ICD-10-CM | POA: Insufficient documentation

## 2016-12-09 DIAGNOSIS — D509 Iron deficiency anemia, unspecified: Secondary | ICD-10-CM | POA: Diagnosis not present

## 2016-12-09 DIAGNOSIS — F419 Anxiety disorder, unspecified: Secondary | ICD-10-CM | POA: Insufficient documentation

## 2016-12-09 DIAGNOSIS — K640 First degree hemorrhoids: Secondary | ICD-10-CM | POA: Diagnosis not present

## 2016-12-09 DIAGNOSIS — F17210 Nicotine dependence, cigarettes, uncomplicated: Secondary | ICD-10-CM | POA: Diagnosis not present

## 2016-12-09 DIAGNOSIS — J309 Allergic rhinitis, unspecified: Secondary | ICD-10-CM | POA: Diagnosis not present

## 2016-12-09 DIAGNOSIS — N400 Enlarged prostate without lower urinary tract symptoms: Secondary | ICD-10-CM | POA: Diagnosis not present

## 2016-12-09 DIAGNOSIS — D125 Benign neoplasm of sigmoid colon: Secondary | ICD-10-CM | POA: Diagnosis not present

## 2016-12-09 DIAGNOSIS — R000 Tachycardia, unspecified: Secondary | ICD-10-CM | POA: Diagnosis not present

## 2016-12-09 DIAGNOSIS — Z9884 Bariatric surgery status: Secondary | ICD-10-CM | POA: Diagnosis not present

## 2016-12-09 DIAGNOSIS — Z7951 Long term (current) use of inhaled steroids: Secondary | ICD-10-CM | POA: Insufficient documentation

## 2016-12-09 DIAGNOSIS — J449 Chronic obstructive pulmonary disease, unspecified: Secondary | ICD-10-CM | POA: Insufficient documentation

## 2016-12-09 DIAGNOSIS — D500 Iron deficiency anemia secondary to blood loss (chronic): Secondary | ICD-10-CM | POA: Diagnosis not present

## 2016-12-09 DIAGNOSIS — Z86010 Personal history of colonic polyps: Secondary | ICD-10-CM | POA: Insufficient documentation

## 2016-12-09 DIAGNOSIS — I110 Hypertensive heart disease with heart failure: Secondary | ICD-10-CM | POA: Diagnosis not present

## 2016-12-09 DIAGNOSIS — Z79899 Other long term (current) drug therapy: Secondary | ICD-10-CM | POA: Diagnosis not present

## 2016-12-09 DIAGNOSIS — E119 Type 2 diabetes mellitus without complications: Secondary | ICD-10-CM | POA: Diagnosis not present

## 2016-12-09 DIAGNOSIS — F329 Major depressive disorder, single episode, unspecified: Secondary | ICD-10-CM | POA: Insufficient documentation

## 2016-12-09 DIAGNOSIS — I2510 Atherosclerotic heart disease of native coronary artery without angina pectoris: Secondary | ICD-10-CM | POA: Insufficient documentation

## 2016-12-09 DIAGNOSIS — I471 Supraventricular tachycardia: Secondary | ICD-10-CM | POA: Insufficient documentation

## 2016-12-09 DIAGNOSIS — Z7984 Long term (current) use of oral hypoglycemic drugs: Secondary | ICD-10-CM | POA: Insufficient documentation

## 2016-12-09 DIAGNOSIS — E785 Hyperlipidemia, unspecified: Secondary | ICD-10-CM | POA: Diagnosis not present

## 2016-12-09 DIAGNOSIS — K449 Diaphragmatic hernia without obstruction or gangrene: Secondary | ICD-10-CM | POA: Diagnosis not present

## 2016-12-09 DIAGNOSIS — K635 Polyp of colon: Secondary | ICD-10-CM | POA: Insufficient documentation

## 2016-12-09 DIAGNOSIS — I712 Thoracic aortic aneurysm, without rupture: Secondary | ICD-10-CM | POA: Diagnosis not present

## 2016-12-09 HISTORY — PX: ESOPHAGOGASTRODUODENOSCOPY (EGD) WITH PROPOFOL: SHX5813

## 2016-12-09 HISTORY — PX: COLONOSCOPY WITH PROPOFOL: SHX5780

## 2016-12-09 HISTORY — PX: POLYPECTOMY: SHX5525

## 2016-12-09 LAB — GLUCOSE, CAPILLARY
GLUCOSE-CAPILLARY: 122 mg/dL — AB (ref 65–99)
Glucose-Capillary: 109 mg/dL — ABNORMAL HIGH (ref 65–99)

## 2016-12-09 SURGERY — COLONOSCOPY WITH PROPOFOL
Anesthesia: Monitor Anesthesia Care | Wound class: Contaminated

## 2016-12-09 MED ORDER — STERILE WATER FOR IRRIGATION IR SOLN
Status: DC | PRN
  Administered 2016-12-09: 09:00:00

## 2016-12-09 MED ORDER — LACTATED RINGERS IV SOLN
INTRAVENOUS | Status: DC
  Administered 2016-12-09: 08:00:00 via INTRAVENOUS

## 2016-12-09 MED ORDER — PROPOFOL 10 MG/ML IV BOLUS
INTRAVENOUS | Status: DC | PRN
  Administered 2016-12-09: 50 mg via INTRAVENOUS
  Administered 2016-12-09: 20 mg via INTRAVENOUS
  Administered 2016-12-09: 50 mg via INTRAVENOUS
  Administered 2016-12-09 (×2): 20 mg via INTRAVENOUS
  Administered 2016-12-09: 50 mg via INTRAVENOUS
  Administered 2016-12-09: 20 mg via INTRAVENOUS

## 2016-12-09 MED ORDER — LIDOCAINE HCL (CARDIAC) 20 MG/ML IV SOLN
INTRAVENOUS | Status: DC | PRN
  Administered 2016-12-09: 50 mg via INTRAVENOUS

## 2016-12-09 SURGICAL SUPPLY — 35 items
BALLN DILATOR 10-12 8 (BALLOONS)
BALLN DILATOR 12-15 8 (BALLOONS)
BALLN DILATOR 15-18 8 (BALLOONS)
BALLN DILATOR CRE 0-12 8 (BALLOONS)
BALLN DILATOR ESOPH 8 10 CRE (MISCELLANEOUS) IMPLANT
BALLOON DILATOR 12-15 8 (BALLOONS) IMPLANT
BALLOON DILATOR 15-18 8 (BALLOONS) IMPLANT
BALLOON DILATOR CRE 0-12 8 (BALLOONS) IMPLANT
BLOCK BITE 60FR ADLT L/F GRN (MISCELLANEOUS) ×4 IMPLANT
CANISTER SUCT 1200ML W/VALVE (MISCELLANEOUS) ×4 IMPLANT
CLIP HMST 235XBRD CATH ROT (MISCELLANEOUS) IMPLANT
CLIP RESOLUTION 360 11X235 (MISCELLANEOUS)
FCP ESCP3.2XJMB 240X2.8X (MISCELLANEOUS)
FORCEPS BIOP RAD 4 LRG CAP 4 (CUTTING FORCEPS) IMPLANT
FORCEPS BIOP RJ4 240 W/NDL (MISCELLANEOUS)
FORCEPS ESCP3.2XJMB 240X2.8X (MISCELLANEOUS) IMPLANT
GOWN CVR UNV OPN BCK APRN NK (MISCELLANEOUS) ×4 IMPLANT
GOWN ISOL THUMB LOOP REG UNIV (MISCELLANEOUS) ×4
INJECTOR VARIJECT VIN23 (MISCELLANEOUS) IMPLANT
KIT DEFENDO VALVE AND CONN (KITS) IMPLANT
KIT ENDO PROCEDURE OLY (KITS) ×4 IMPLANT
MARKER SPOT ENDO TATTOO 5ML (MISCELLANEOUS) IMPLANT
PAD GROUND ADULT SPLIT (MISCELLANEOUS) IMPLANT
PROBE APC STR FIRE (PROBE) IMPLANT
RETRIEVER NET PLAT FOOD (MISCELLANEOUS) IMPLANT
RETRIEVER NET ROTH 2.5X230 LF (MISCELLANEOUS) IMPLANT
SNARE SHORT THROW 13M SML OVAL (MISCELLANEOUS) ×4 IMPLANT
SNARE SHORT THROW 30M LRG OVAL (MISCELLANEOUS) IMPLANT
SNARE SNG USE RND 15MM (INSTRUMENTS) IMPLANT
SPOT EX ENDOSCOPIC TATTOO (MISCELLANEOUS)
SYR INFLATION 60ML (SYRINGE) IMPLANT
TRAP ETRAP POLY (MISCELLANEOUS) ×4 IMPLANT
VARIJECT INJECTOR VIN23 (MISCELLANEOUS)
WATER STERILE IRR 250ML POUR (IV SOLUTION) ×4 IMPLANT
WIRE CRE 18-20MM 8CM F G (MISCELLANEOUS) IMPLANT

## 2016-12-09 NOTE — Transfer of Care (Signed)
Immediate Anesthesia Transfer of Care Note  Patient: Mark Morris  Procedure(s) Performed: Procedure(s) with comments: COLONOSCOPY WITH PROPOFOL (N/A) ESOPHAGOGASTRODUODENOSCOPY (EGD) WITH PROPOFOL (N/A) - Diabetic - oral meds Sleep apnea POLYPECTOMY  Patient Location: PACU  Anesthesia Type: MAC  Level of Consciousness: awake, alert  and patient cooperative  Airway and Oxygen Therapy: Patient Spontanous Breathing and Patient connected to supplemental oxygen  Post-op Assessment: Post-op Vital signs reviewed, Patient's Cardiovascular Status Stable, Respiratory Function Stable, Patent Airway and No signs of Nausea or vomiting  Post-op Vital Signs: Reviewed and stable  Complications: No apparent anesthesia complications

## 2016-12-09 NOTE — Anesthesia Preprocedure Evaluation (Signed)
Anesthesia Evaluation  Patient identified by MRN, date of birth, ID band Patient awake    Reviewed: Allergy & Precautions, H&P , NPO status , Patient's Chart, lab work & pertinent test results, reviewed documented beta blocker date and time   Airway Mallampati: II  TM Distance: >3 FB Neck ROM: full    Dental no notable dental hx.    Pulmonary sleep apnea , Current Smoker,    Pulmonary exam normal breath sounds clear to auscultation       Cardiovascular Exercise Tolerance: Good hypertension, + CAD and +CHF (EF 40%, patient asymptomatic today)  negative cardio ROS  + dysrhythmias Supra Ventricular Tachycardia  Rhythm:regular Rate:Normal     Neuro/Psych negative neurological ROS  negative psych ROS   GI/Hepatic Neg liver ROS, Bowel prep,  Endo/Other  diabetes  Renal/GU negative Renal ROS  negative genitourinary   Musculoskeletal  (+) Arthritis ,   Abdominal   Peds  Hematology  (+) anemia ,   Anesthesia Other Findings   Reproductive/Obstetrics negative OB ROS                             Anesthesia Physical Anesthesia Plan  ASA: III  Anesthesia Plan: MAC   Post-op Pain Management:    Induction:   Airway Management Planned:   Additional Equipment:   Intra-op Plan:   Post-operative Plan:   Informed Consent: I have reviewed the patients History and Physical, chart, labs and discussed the procedure including the risks, benefits and alternatives for the proposed anesthesia with the patient or authorized representative who has indicated his/her understanding and acceptance.   Dental Advisory Given  Plan Discussed with: CRNA  Anesthesia Plan Comments:         Anesthesia Quick Evaluation

## 2016-12-09 NOTE — Op Note (Signed)
[facility] Gastroenterology Patient Name: Mark Morris Procedure Date: 12/09/2016 8:24 AM MRN: [id number] Account #: [id number] Date of Birth: 1950-10-12 Admit Type: Outpatient Age: 66 Room: [facility] OR ROOM 01 Gender: Male Note Status: Finalized Procedure:            Colonoscopy Indications:          Iron deficiency anemia Providers:            Lucilla Lame MD, MD Medicines:            Propofol per Anesthesia Complications:        No immediate complications. Procedure:            Pre-Anesthesia Assessment:                       - Prior to the procedure, a History and Physical was                        performed, and patient medications and allergies were                        reviewed. The patient's tolerance of previous                        anesthesia was also reviewed. The risks and benefits of                        the procedure and the sedation options and risks were                        discussed with the patient. All questions were                        answered, and informed consent was obtained. Prior                        Anticoagulants: The patient has taken no previous                        anticoagulant or antiplatelet agents. ASA Grade                        Assessment: II - A patient with mild systemic disease.                        After reviewing the risks and benefits, the patient was                        deemed in satisfactory condition to undergo the                        procedure.                       After obtaining informed consent, the colonoscope was                        passed under direct vision. Throughout the procedure,                        the patient's blood pressure, pulse, and  oxygen                        saturations were monitored continuously. The [place] [id number]) was introduced through the                        anus and advanced to the the terminal ileum. The                 colonoscopy was performed without difficulty. The                        patient tolerated the procedure well. The quality of                        the bowel preparation was poor. Findings:      The perianal and digital rectal examinations were normal.      A 4 mm polyp was found in the sigmoid colon. The polyp was sessile. The       polyp was removed with a cold snare. Resection and retrieval were       complete.      A large amount of stool was found in the entire colon.      Non-bleeding internal hemorrhoids were found during retroflexion. The       hemorrhoids were Grade I (internal hemorrhoids that do not prolapse).      The terminal ileum appeared normal. Impression:           - Preparation of the colon was poor.                       - One 4 mm polyp in the sigmoid colon, removed with a                        cold snare. Resected and retrieved.                       - Stool in the entire examined colon.                       - Non-bleeding internal hemorrhoids.                       - The examined portion of the ileum was normal. Recommendation:       - Discharge patient to home.                       - Resume previous diet.                       - Continue present medications.                       - Await pathology results.                       - Repeat colonoscopy in 5 years for surveillance. Procedure Code(s):    --- Professional ---  45385, Colonoscopy, flexible; with removal of tumor(s),                        polyp(s), or other lesion(s) by snare technique Diagnosis Code(s):    --- Professional ---                       D50.9, Iron deficiency anemia, unspecified                       D12.5, Benign neoplasm of sigmoid colon CPT copyright 2016 American Medical Association. All rights reserved. The codes documented in this report are preliminary and upon coder review may  be revised to meet current compliance requirements. Lucilla Lame  MD, MD 12/09/2016 9:04:15 AM This report has been signed electronically. Number of Addenda: 0 Note Initiated On: 12/09/2016 8:24 AM Scope Withdrawal Time: 0 hours 6 minutes 0 seconds  Total Procedure Duration: 0 hours 13 minutes 45 seconds       [facility]

## 2016-12-09 NOTE — Interval H&P Note (Signed)
History and Physical Interval Note:  12/09/2016 7:51 AM  Mark Morris  has presented today for surgery, with the diagnosis of Iron deficiency anemia D50.0  The various methods of treatment have been discussed with the patient and family. After consideration of risks, benefits and other options for treatment, the patient has consented to  Procedure(s) with comments: COLONOSCOPY WITH PROPOFOL (N/A) ESOPHAGOGASTRODUODENOSCOPY (EGD) WITH PROPOFOL (N/A) - Diabetic - oral meds Sleep apnea as a surgical intervention .  The patient's history has been reviewed, patient examined, no change in status, stable for surgery.  I have reviewed the patient's chart and labs.  Questions were answered to the patient's satisfaction.     Eudell Mcphee [employer]

## 2016-12-09 NOTE — H&P (View-Only) (Signed)
Gastroenterology Consultation  Referring Provider:     Tonia Ghent, MD Primary Care Physician:  Elsie Stain, MD Primary Gastroenterologist:  Dr. Allen Norris     Reason for Consultation:     Iron deficiency anemia        HPI:   Mark Morris is a 66 y.o. y/o male referred for consultation & management of Iron deficiency anemia by Dr. Elsie Stain, MD.  This patient comes in today with a finding of iron deficiency anemia. The patient reports that he had a colonoscopy in the past and is overdue for a repeat colonoscopy because she had polyps at that time. The patient denies any unexplained weight loss but has had diarrhea since having a gastric bypass surgery many years ago. There is no report of any black stools or bloody stools. The patient also denies any dysphagia or early satiety. The patient was started on iron and he states that he has felt better and his labs have shown that his iron studies have come up. Is no family history of colon cancer colon polyps. The patient was also noted to have abnormal liver enzymes. His liver enzymes showed his AST of 61 with his ALT of 201 and his total bilirubin 1.5. The patient denies any alcohol abuse. He also had a hepatitis C blood test that was negative.  Past Medical History:  Diagnosis Date  . Allergy 2002   allergic rhinitis  . Anxiety 08/24/1999  . Arthritis   . Ascending aortic aneurysm ([place])   . BPH (benign prostatic hyperplasia)   . CAD (coronary artery disease)    a. 2004: Non-obstructive CAD with Proximal RCA 25%, EF 65%;  b. Myoview 12/05: no scar or ischemia, EF 58% c. 02/2014: cath showing 30% pLAD disease with mild luminal irregularities, EF reduced to 35-45%  . CHF (congestive heart failure) ([place])   . COPD (chronic obstructive pulmonary disease) ([place])   . DDD (degenerative disc disease), lumbosacral   . Depression 08/24/1999  . Diabetes mellitus 08/24/2003   typeII  . Dysrhythmia 2013   h/o SVT   . Enteritis 10/18/2004   CT abd. Enteritis//CT pelvis Sacroiliac Spurring  . Hearing difficulty of both ears   . HNP (herniated nucleus pulposus), lumbar   . Hyperlipidemia 08/23/1997  . Hypertension 1980  . Paroxysmal SVT (supraventricular tachycardia) ([facility])    History of  . PNA (pneumonia)   . Post-operative infection 2006   after gastric bypass for weight loss  . Pulmonary nodules   . Shortness of breath    exertional  . Sigmoid polyp 07/03/2007   colonoscopy Small internal hemorrhoids; Sigmoid polyp B9 (Dr. Elliott)//colonoscopy normal 04/06/2004  . Sleep apnea    does not use C-pap ; study done >10 years ago  . Tobacco abuse     Past Surgical History:  Procedure Laterality Date  . ANKLE FRACTURE SURGERY  02/2003  . APPENDECTOMY    . BACK SURGERY  2010  . CARDIAC CATHETERIZATION  01/14/2003   Min plaque EF65%// Cath normal (Dr. Clayborn Bigness 2000)//ECHO Mild AAA TrMR Ef61% 05/30/2002 (Dr.Callwood)  . CARDIAC CATHETERIZATION  2015   [facility]  . CARPAL TUNNEL RELEASE Bilateral   . CHOLECYSTECTOMY    . COLONOSCOPY    . GASTRIC BYPASS  11/24/2004  . GASTRIC FUNDOPLICATION  6759   [facility]  . HERNIA REPAIR  01/02/2006   right ventral Hernia Repair Laparascopic [place] Pai  . KNEE ARTHROSCOPY W/ MENISCAL REPAIR Right   . MAXIMUM ACCESS (MAS)POSTERIOR LUMBAR  INTERBODY FUSION (PLIF) 2 LEVEL N/A 04/12/2014   Procedure: Lumbar Four-Five Lumbar Five-Sacral One Redo Laminectomy and Posterior Lumbar Interbody Fusion;  Surgeon: Erline Levine, MD;  Location: [place] NEURO ORS;  Service: Neurosurgery;  Laterality: N/A;  Lumbar Four-Five Lumbar Five-Sacral One Redo Laminectomy and Posterior Lumbar Interbody Fusion  . sleep study  03/2004   Sleep Study Hypopneas 60/hr ; Kicks 160/hr    Prior to Admission medications   Medication Sig Start Date End Date Taking? Authorizing Provider  albuterol (PROVENTIL HFA;VENTOLIN HFA) 108 (90 Base) MCG/ACT inhaler Inhale 2 puffs into the lungs every 6 (six) hours as needed for wheezing  or shortness of breath (fill with proair inhaler). 10/27/16  Yes Tonia Ghent, MD  ALPRAZolam Duanne Moron) 0.5 MG tablet Take 1 tablet (0.5 mg total) by mouth 2 (two) times daily as needed. 11/30/16  Yes Tonia Ghent, MD  Blood Glucose Monitoring Suppl (ONE TOUCH ULTRA SYSTEM KIT) W/DEVICE KIT Check blood sugar once daily and as directed. Ex E11.9 07/05/14  Yes Tonia Ghent, MD  budesonide-formoterol [facility]) 160-4.5 MCG/ACT inhaler Inhale 1 puff into the lungs 2 (two) times daily. Rinse after use 10/26/16  Yes Tonia Ghent, MD  calcium elemental as carbonate (CALCIUM ANTACID ULTRA) 400 MG tablet Chew 1,000 mg by mouth daily.     Yes Historical Provider, MD  carvedilol (COREG) 12.5 MG tablet Take 1 tablet (12.5 mg total) by mouth 2 (two) times daily. 11/25/16  Yes Minna Merritts, MD  cyanocobalamin (,VITAMIN B-12,) 1000 MCG/ML injection Inject 1,000 mcg into the muscle every 14 (fourteen) days. 09/07/16  Yes Historical Provider, MD  cyclobenzaprine (FLEXERIL) 10 MG tablet TAKE ONE-HALF TO ONE TABLET BY MOUTH TWICE DAILY AS NEEDED FOR MUSCLE SPASM (CAUSES SEDATION) 07/22/16  Yes Tonia Ghent, MD  doxazosin (CARDURA) 2 MG tablet TAKE ONE TABLET BY MOUTH ONCE DAILY 08/31/16  Yes Tonia Ghent, MD  ferrous sulfate 325 (65 FE) MG tablet Take 1 tablet (325 mg total) by mouth daily with breakfast. 11/09/16  Yes Tonia Ghent, MD  finasteride (PROSCAR) 5 MG tablet Take 1 tablet (5 mg total) by mouth daily. 10/26/16  Yes Tonia Ghent, MD  FLUoxetine (PROZAC) 40 MG capsule Take 1 capsule (40 mg total) by mouth daily. 10/26/16  Yes Tonia Ghent, MD  fluticasone (FLONASE) 50 MCG/ACT nasal spray Place 2 sprays into both nostrils daily. 08/22/15  Yes Tonia Ghent, MD  furosemide (LASIX) 40 MG tablet Take 1 tablet (40 mg total) by mouth 2 (two) times daily. 11/30/16  Yes Tonia Ghent, MD  glucose blood (ONE TOUCH ULTRA TEST) test strip Use as instructed to test blood sugar 1-2 times daily.   Diagnosis:E11.9.  History of high and low sugars, with need for mult testing per day 02/27/15  Yes Tonia Ghent, MD  guaiFENesin (MUCINEX) 600 MG 12 hr tablet Take 1 tablet (600 mg total) by mouth 2 (two) times daily. 02/27/15  Yes Tonia Ghent, MD  metFORMIN (GLUCOPHAGE) 500 MG tablet Take 2 tablets (1,000 mg total) by mouth 2 (two) times daily with a meal. 10/26/16  Yes Tonia Ghent, MD  Multiple Vitamin (MULTIVITAMIN) tablet Take 1 tablet by mouth daily.     Yes Historical Provider, MD  nitroGLYCERIN (NITROSTAT) 0.4 MG SL tablet Place 1 tablet (0.4 mg total) under the tongue every 5 (five) minutes as needed for chest pain. 11/25/16 12/20/16 Yes Minna Merritts, MD  ONE TOUCH ULTRA TEST test strip  USE STRIP AS INSTRUCTED TO TEST BLOOD SUGAR ONCE DAILY. 04/23/16  Yes Tonia Ghent, MD  [place] DELICA LANCETS FINE MISC check blood sugar once daily and as directed.E11.9 07/05/14  Yes Tonia Ghent, MD  potassium chloride (K-DUR) 10 MEQ tablet Take 3 tablets (30 mEq total) by mouth daily. 11/25/16  Yes Minna Merritts, MD  rOPINIRole (REQUIP) 1 MG tablet Take 1 tablet (1 mg total) by mouth at bedtime. 10/26/16  Yes Tonia Ghent, MD  sacubitril-valsartan (ENTRESTO) 49-51 MG Take 1 tablet by mouth 2 (two) times daily. 11/25/16  Yes Minna Merritts, MD  simvastatin (ZOCOR) 20 MG tablet Take 1 tablet (20 mg total) by mouth daily. 11/30/16  Yes Tonia Ghent, MD  tiotropium (SPIRIVA HANDIHALER) 18 MCG inhalation capsule Place 1 capsule (18 mcg total) into inhaler and inhale daily. 10/26/16  Yes Tonia Ghent, MD  traZODone (DESYREL) 50 MG tablet TAKE ONE-HALF TO ONE TABLET BY MOUTH AT BEDTIME AS NEEDED FOR SLEEP 10/26/16  Yes Tonia Ghent, MD    Family History  Problem Relation Age of Onset  . Heart disease Mother 53    CABG DM; Died due to Staph infection of sterotomy incision  . Diabetes Mother   . Hypertension Father   . Heart disease Father 36    pneumonia/CHF  . Alcohol abuse Brother 51     Has stopped at present  . Diabetes Brother   . Alcohol abuse Brother   . Diabetes Brother   . Colon cancer Neg Hx   . Prostate cancer Neg Hx      Social History  Substance Use Topics  . Smoking status: Current Every Day Smoker    Packs/day: 0.25    Years: 40.00    Types: Cigarettes  . Smokeless tobacco: Never Used     Comment: cutting down as of 06/2014, down to 1/2 PPD or less.   . Alcohol use No    Allergies as of 12/02/2016 - Review Complete 12/02/2016  Allergen Reaction Noted  . Codeine  05/10/2007  . Sulfonamide derivatives  05/10/2007    Review of Systems:    All systems reviewed and negative except where noted in HPI.   Physical Exam:  BP 131/70   Pulse 94   Temp 98.4 F (36.9 C) (Oral)   Ht 5' 11"  (1.803 m)   Wt 222 lb (100.7 kg)   BMI 30.96 kg/m  No LMP for male patient. Psych:  Alert and cooperative. Normal mood and affect. General:   Alert,  Well-developed, well-nourished, pleasant and cooperative in NAD Head:  Normocephalic and atraumatic. Eyes:  Sclera clear, no icterus.   Conjunctiva pink. Ears:  Normal auditory acuity. Nose:  No deformity, discharge, or lesions. Mouth:  No deformity or lesions,oropharynx pink & moist. Neck:  Supple; no masses or thyromegaly. Lungs:  Respirations even and unlabored.  Clear throughout to auscultation.   No wheezes, crackles, or rhonchi. No acute distress. Heart:  Regular rate and rhythm; no murmurs, clicks, rubs, or gallops. Abdomen:  Normal bowel sounds.  No bruits.  Soft, non-tender and non-distended without masses, hepatosplenomegaly or hernias noted.  No guarding or rebound tenderness.  Negative Carnett sign.   Rectal:  Deferred.  Msk:  Symmetrical without gross deformities.  Good, equal movement & strength bilaterally. Pulses:  Normal pulses noted. Extremities:  No clubbing or edema.  No cyanosis. Neurologic:  Alert and oriented x3;  grossly normal neurologically. Skin:  Intact without significant lesions or  rashes.  No jaundice. Lymph Nodes:  No significant cervical adenopathy. Psych:  Alert and cooperative. Normal mood and affect.  Imaging Studies: Dg Chest 2 View  Result Date: 11/18/2016 CLINICAL DATA:  Increasing congestion, shortness of breath and intermittent chest pain with exertion for approximately 2 weeks EXAM: CHEST  2 VIEW COMPARISON:  07/25/2013 FINDINGS: Minimal enlargement of cardiac silhouette. Mediastinal contours and pulmonary vascularity normal. Bronchitic changes with diffuse interstitial infiltrates/thickening in both lungs new since 2014, could represent atypical infection, edema, or interval development of interstitial lung disease. No segmental consolidation or pneumothorax. Tiny pleural effusions blunt the posterior costophrenic angles. Bones demineralized. IMPRESSION: Enlargement of cardiac silhouette. Bronchitic and new diffuse interstitial infiltrates and tiny pleural effusions, could represent pulmonary edema or atypical infection though interval development of interstitial lung disease is not excluded. Electronically Signed   By: Lavonia Dana M.D.   On: 11/18/2016 18:35   Ct Angio Chest Pe W Or Wo Contrast  Result Date: 11/18/2016 CLINICAL DATA:  Congestion, shortness of breath and intermittent chest pain with exertion for 2 weeks. Low hemoglobin. History of diabetes, hypertension, COPD. EXAM: CT ANGIOGRAPHY CHEST WITH CONTRAST TECHNIQUE: Multidetector CT imaging of the chest was performed using the standard protocol during bolus administration of intravenous contrast. Multiplanar CT image reconstructions and MIPs were obtained to evaluate the vascular anatomy. CONTRAST:  75 cc Isovue 370 COMPARISON:  Chest radiograph November 18, 2016 at 1756 hours FINDINGS: CARDIOVASCULAR: Adequate contrast opacification of the pulmonary artery's. Main pulmonary artery is not enlarged. No pulmonary arterial filling defects to the level of the segmental branches. Respiratory motion limits assessment  of subsegmental pulmonary branches. The heart is mildly enlarged. Moderate coronary artery calcifications. No pericardial effusion. Fusiform aneurysmal 4.4 x 4.6 cm ascending aorta. Mild calcific atherosclerosis of the aortic arch. MEDIASTINUM/NODES: 14 mm aortopulmonary window lymph node. Additional subcentimeter mediastinal and hilar lymph nodes. LUNGS/PLEURA: Tracheobronchial tree is patent, no pneumothorax. Diffuse bronchial wall thickening. Small bilateral pleural effusions. At least 5 scattered ground-glass nodules measuring up to 4 mm. Apical paraseptal emphysema. Moderate centrilobular emphysema edema. UPPER ABDOMEN: Included view of the abdomen is unremarkable. MUSCULOSKELETAL: Status post cholecystectomy, gastric bypass and abdominal herniorrhaphy. Colonic intra positioning. 15 mm cyst or possible hemangioma dome of the liver. Contrast refluxed into the intrahepatic IVC. Review of the MIP images confirms the above findings. IMPRESSION: No acute pulmonary embolism. RIGHT middle lobe atelectasis versus pneumonia. Mild cardiomegaly and suspected RIGHT heart failure. Small pleural effusions. Moderate emphysema. Bronchial wall thickening can be seen with bronchitis, pulmonary edema or reactive airway disease. **An incidental finding of potential clinical significance has been found. 4.4 x 4.6 cm ascending aortic aneurysm. Ascending thoracic aortic aneurysm. Recommend semi-annual imaging followup by CTA or MRA and referral to cardiothoracic surgery if not already obtained. This recommendation follows 2010 ACCF/AHA/AATS/ACR/ASA/SCA/SCAI/SIR/STS/SVM Guidelines for the Diagnosis and Management of Patients With Thoracic Aortic Disease. Circulation. 2010; 121: H417-E081** **An incidental finding of potential clinical significance has been found. Multiple sub solid less than 6 mm pulmonary nodules. Recommend follow-up CT at 3-6 months.** Electronically Signed   By: Elon Alas M.D.   On: 11/18/2016 20:33     Assessment and Plan:   OSWELL SAY is a 66 y.o. y/o male who comes in with a history of iron deficiency anemia with a history of colon polyps. The patient has also had gastric bypass in the past. The patient will be set up for a EGD and colonoscopy. The patient will also be set up for right  upper quadrant ultrasound with blood sent off for other possible causes of his abnormal liver enzymes. The patient has been explained the plan and agrees with it.    Lucilla Lame, MD. Marval Regal   Note: This dictation was prepared with Dragon dictation along with smaller phrase technology. Any transcriptional errors that result from this process are unintentional.

## 2016-12-09 NOTE — Op Note (Signed)
[facility] Gastroenterology Patient Name: Mark Morris Procedure Date: 12/09/2016 8:24 AM MRN: [id number] Account #: [id number] Date of Birth: 08/20/1951 Admit Type: Outpatient Age: 66 Room: [facility] OR ROOM 01 Gender: Male Note Status: Finalized Procedure:            Upper GI endoscopy Indications:          Iron deficiency anemia Providers:            Lucilla Lame MD, MD Referring MD:         Elveria Rising. Damita Dunnings, MD (Referring MD) Medicines:            Propofol per Anesthesia Complications:        No immediate complications. Procedure:            Pre-Anesthesia Assessment:                       - Prior to the procedure, a History and Physical was                        performed, and patient medications and allergies were                        reviewed. The patient's tolerance of previous                        anesthesia was also reviewed. The risks and benefits of                        the procedure and the sedation options and risks were                        discussed with the patient. All questions were                        answered, and informed consent was obtained. Prior                        Anticoagulants: The patient has taken no previous                        anticoagulant or antiplatelet agents. ASA Grade                        Assessment: II - A patient with mild systemic disease.                        After reviewing the risks and benefits, the patient was                        deemed in satisfactory condition to undergo the                        procedure.                       After obtaining informed consent, the endoscope was                        passed under direct vision. Throughout the procedure,  the patient's blood pressure, pulse, and oxygen                        saturations were monitored continuously. The Olympus                        190 Endoscope [id number]) was introduced through the                         mouth, and advanced to the jejunum. The upper GI                        endoscopy was accomplished without difficulty. The                        patient tolerated the procedure well. Findings:      A small hiatal hernia was present.      Evidence of a gastric bypass was found. A gastric pouch with a small       size was found. The gastrojejunal anastomosis was characterized by       healthy appearing mucosa. This was traversed. The pouch-to-jejunum limb       was characterized by healthy appearing mucosa.      The examined jejunum was normal. Impression:           - Small hiatal hernia.                       - Gastric bypass with a small-sized pouch.                        Gastrojejunal anastomosis characterized by healthy                        appearing mucosa.                       - Normal examined jejunum.                       - No specimens collected. Recommendation:       - Perform a colonoscopy today.                       - Resume previous diet.                       - Continue present medications. Procedure Code(s):    --- Professional ---                       [phone], Esophagogastroduodenoscopy, flexible, transoral;                        diagnostic, including collection of specimen(s) by                        brushing or washing, when performed (separate procedure) Diagnosis Code(s):    --- Professional ---                       D50.9, Iron deficiency anemia, unspecified  Z98.84, Bariatric surgery status CPT copyright 2016 American Medical Association. All rights reserved. The codes documented in this report are preliminary and upon coder review may  be revised to meet current compliance requirements. Lucilla Lame MD, MD 12/09/2016 8:46:54 AM This report has been signed electronically. Number of Addenda: 0 Note Initiated On: 12/09/2016 8:24 AM Total Procedure Duration: 0 hours 1 minute 49 seconds       [facility]

## 2016-12-09 NOTE — Anesthesia Postprocedure Evaluation (Signed)
Anesthesia Post Note  Patient: Mark Morris  Procedure(s) Performed: Procedure(s) (LRB): COLONOSCOPY WITH PROPOFOL (N/A) ESOPHAGOGASTRODUODENOSCOPY (EGD) WITH PROPOFOL (N/A) POLYPECTOMY  Patient location during evaluation: PACU Anesthesia Type: MAC Level of consciousness: awake and alert Pain management: pain level controlled Vital Signs Assessment: post-procedure vital signs reviewed and stable Respiratory status: spontaneous breathing, nonlabored ventilation, respiratory function stable and patient connected to nasal cannula oxygen Cardiovascular status: stable and blood pressure returned to baseline Anesthetic complications: no    Alisa Graff

## 2016-12-10 ENCOUNTER — Encounter: Payer: Self-pay | Admitting: Gastroenterology

## 2016-12-10 ENCOUNTER — Telehealth: Payer: Self-pay

## 2016-12-10 NOTE — Telephone Encounter (Signed)
-----   Message from Lucilla Lame, MD sent at 12/07/2016  5:42 PM EDT ----- Let the patient know that his ultrasound showed fatty liver.

## 2016-12-10 NOTE — Telephone Encounter (Signed)
-----   Message from Lucilla Lame, MD sent at 12/06/2016 11:04 AM EDT ----- Let the patient know that all of his labs came back normal with his liver enzymes now back to normal. No further workup is needed at this time since his never enzymes have come back to normal.

## 2016-12-13 ENCOUNTER — Encounter: Payer: Self-pay | Admitting: Gastroenterology

## 2016-12-13 NOTE — Telephone Encounter (Signed)
Patient has been notified his liver enzymes came back to normal, no further work up is needed since normal.

## 2016-12-14 ENCOUNTER — Telehealth: Payer: Self-pay | Admitting: Family Medicine

## 2016-12-14 NOTE — Telephone Encounter (Signed)
He was here with wife today at her visit.  I talked with patient today.   He started back on simvastatin since his LFTs were normal with Dr. Allen Norris.  D/w pt.   He had some chest congestion in the meantime.  No fevers.  No BLE edema. No NTG use for CP.  More cough.  Some wheeze. Still on inhalers. No sputum.  Recent sx started in the last 2-3 days.    nad ncat rrr ctab, no wheeze, no focal dec in BS.   No BLE edema  We agreed to have him monitor his sx and update me. He agreed.

## 2017-01-15 ENCOUNTER — Telehealth: Payer: Self-pay | Admitting: Family Medicine

## 2017-01-16 NOTE — Telephone Encounter (Signed)
I received a phone call from [facility] [employer] [facility] after hours reporting the death of Mr Mark Morris. Caller did not have information about circumstances of his death or time when this occurred.  Caller states that if more details are needed, these can be provided at [phone].  Betty Martinique, MD

## 2017-01-17 NOTE — Telephone Encounter (Signed)
I am out of town. Please see what details you can get and please offer my condolences.

## 2017-01-18 NOTE — Telephone Encounter (Signed)
PLEASE NOTE: All timestamps contained within this report are represented as [place] Standard Time. CONFIDENTIALTY NOTICE: This fax transmission is intended only for the addressee. It contains information that is legally privileged, confidential or otherwise protected from use or disclosure. If you are not the intended recipient, you are strictly prohibited from reviewing, disclosing, copying using or disseminating any of this information or taking any action in reliance on or regarding this information. If you have received this fax in error, please notify us immediately by telephone so that we can arrange for its return to [place]. Phone: [phone], Toll-Free: [phone], Fax: [phone] Page: 1 of 1 Call Id: [id number] [place] Night - Client Nonclinical Telephone Record [place] Night - Client Client Site [place] Physician Renford Dills - MD Contact Type Call Who Is Calling Physician / Provider / Hospital Call Type Provider Call [facility] Page Now Reason for Call Request to speak to Physician Initial Comment Caller states he is calling from [place] police station in [place], he is calling to report a death notification and they could like to speak with the Dr. Damita Dunnings Additional Comment Patient Name Mark Morris Patient DOB Wife told officer it was either 8/13 or 8/14 of 1952 Requesting Provider Network engineer Physician Number [phone] Facility Name [facility] Phone DateTime Result/Outcome Message Type Notes Martinique, Betty - MD [phone] Feb 08, 2017 12:15:48 PM Called On Call Provider - Reached Doctor Paged Martinique, Betty - MD 2017/02/08 12:15:55 PM Spoke with On Call - General Message Result Call Closed By: Kelby Aline Transaction Date/Time: 02-08-17 11:54:17 AM (ET)

## 2017-01-18 NOTE — Telephone Encounter (Signed)
Mrs Moser said that she found pt dead on couch on 2017-02-02; Mrs Remer thinks pt died while sleeping. Mrs Weihe appreciates everything Dr Damita Dunnings did for pt. FYI to Dr Damita Dunnings.

## 2017-01-18 NOTE — Telephone Encounter (Signed)
Noted, thanks, please update the chart since patient is deceased.

## 2017-01-20 ENCOUNTER — Telehealth: Payer: Self-pay

## 2017-01-20 NOTE — Telephone Encounter (Signed)
Sgt Walker with [place] [employer] left v/m; he is conducting a death investigation and wanted to ck on status of death certificate if Dr Damita Dunnings is going to sign or will ME sign. I spoke with Morey Hummingbird and she said death certificate is on Dr Josefine Class desk and funeral home understands will not be signed prior to Dr Carole Civil return on 01/24/17. Left v/m for Sgt Walker to cb.

## 2017-01-20 NOTE — Telephone Encounter (Signed)
I usually sign certificates based on the available information.  I have no control if this is referred to the medical examiner.   Thanks.

## 2017-01-20 NOTE — Telephone Encounter (Signed)
Sgt Walker called back and advised per first part of this note. Sgt Walker request copy of death certificate when completed and call Sgt Walker at contact # when copy of certificate ready for pick up. FYI to Dr Damita Dunnings and Terri Skains CMA.

## 2017-01-21 DEATH — deceased

## 2017-01-24 NOTE — Telephone Encounter (Signed)
I am okay with releasing the death certificate to Dion Body assuming he meets the clinic/hospital requirements for such a disclosure.  Thanks.

## 2017-01-25 NOTE — Telephone Encounter (Signed)
Spoke directly with Sherlon Handing, HIM manager.  She gave me a very specific answer.  We do NOT release information regarding a patients death certificate.  The local police will have to go through the clerk of court to get access to the death certificate.  We do not get signed releases at the primary care office level.

## 2018-02-27 IMAGING — CR DG CHEST 2V
2 series · 2 of 2 positions shown · non-contrast
Comparison: 07/25/2013

CLINICAL DATA: Increasing congestion, shortness of breath and
intermittent chest pain with exertion for approximately 2 weeks

EXAM:
CHEST  2 VIEW

[chest pa]
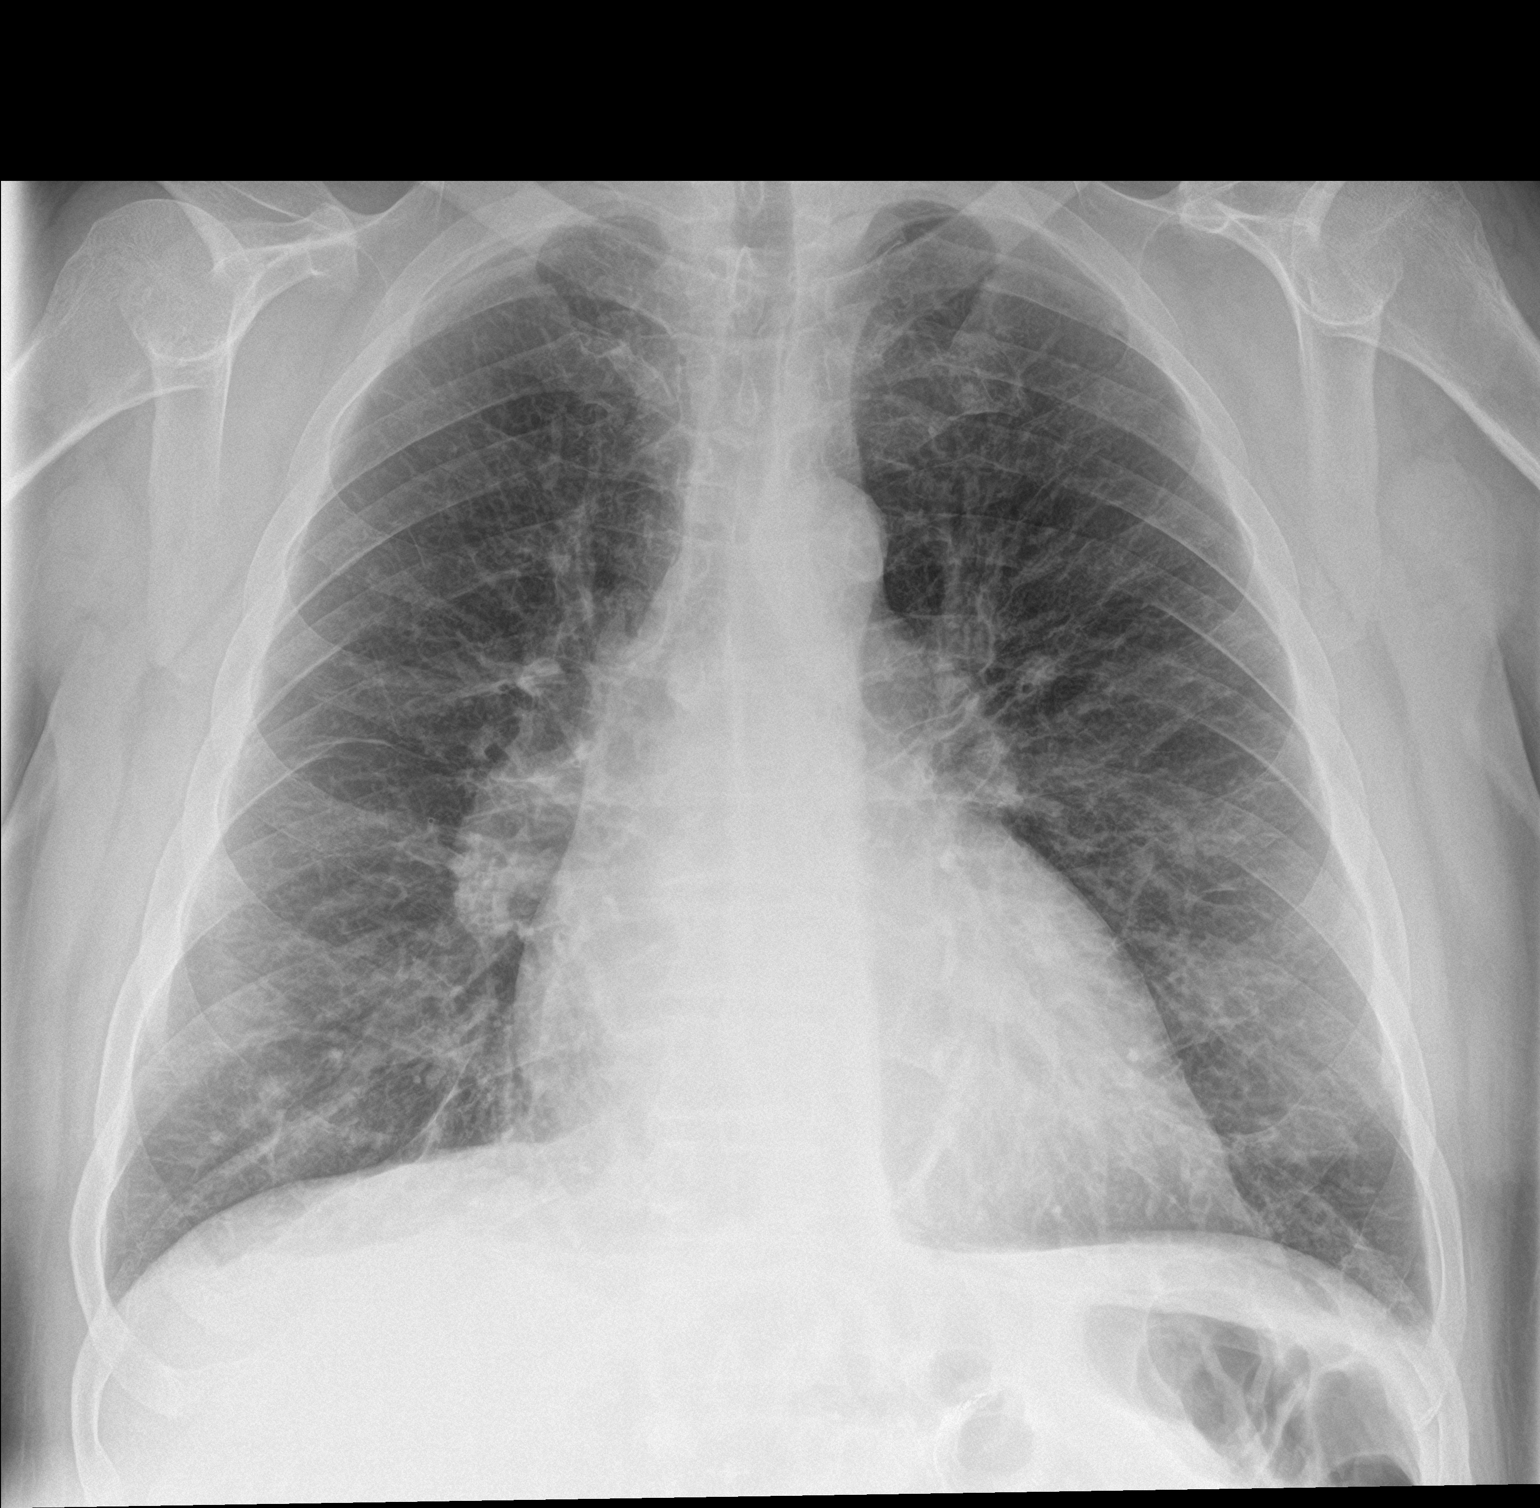

[chest lat]
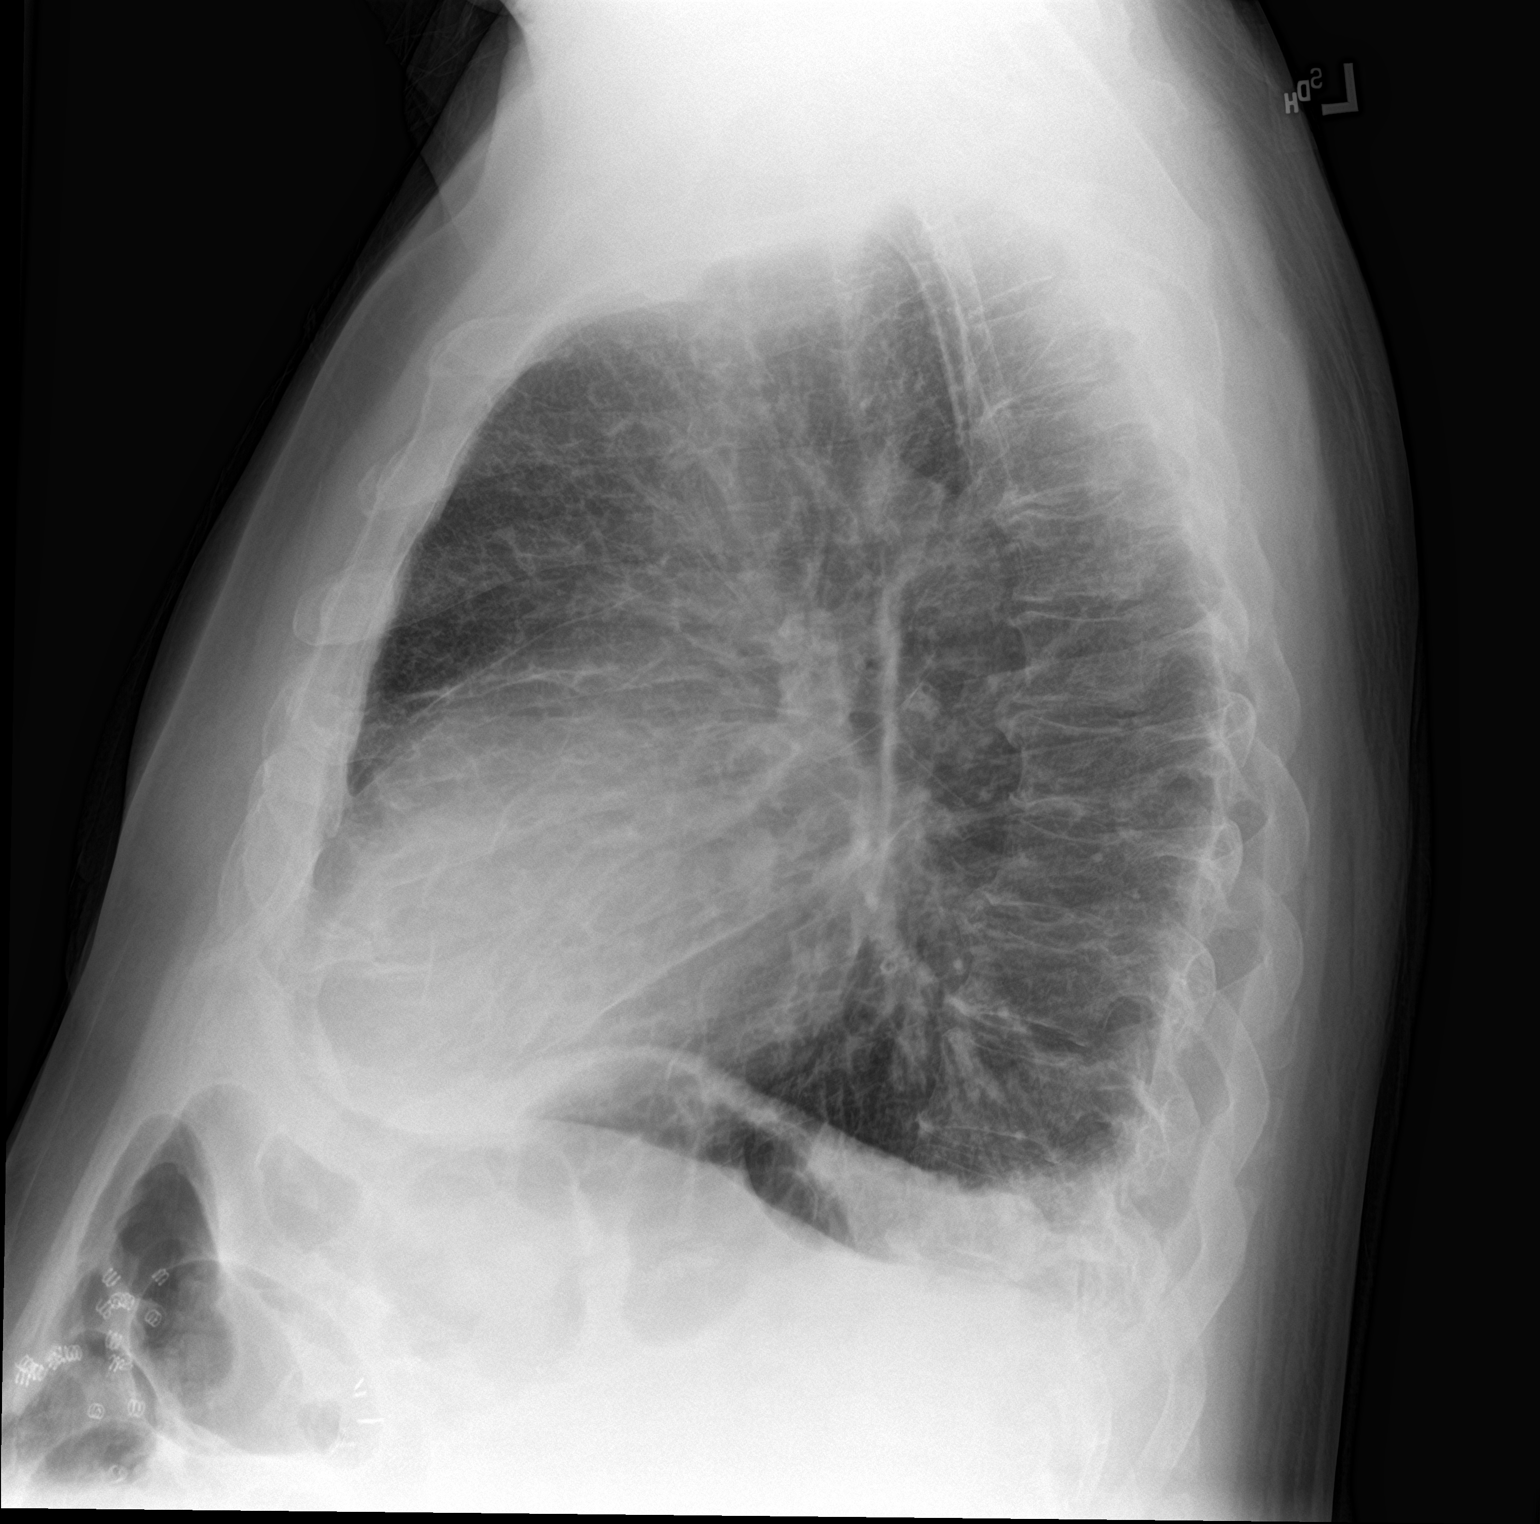

[2 of 2 positions shown; findings below may reference images not displayed]

FINDINGS: Minimal enlargement of cardiac silhouette.

Mediastinal contours and pulmonary vascularity normal.

Bronchitic changes with diffuse interstitial infiltrates/thickening
in both lungs new since 3997, could represent atypical infection,
edema, or interval development of interstitial lung disease.

No segmental consolidation or pneumothorax.

Tiny pleural effusions blunt the posterior costophrenic angles.

Bones demineralized.
IMPRESSION: Enlargement of cardiac silhouette.

Bronchitic and new diffuse interstitial infiltrates and tiny pleural
effusions, could represent pulmonary edema or atypical infection
though interval development of interstitial lung disease is not
excluded.

## 2018-12-11 IMAGING — US US ABDOMEN LIMITED
1 series · 14 of 25 positions shown · non-contrast
Comparison: 10/18/2004 CT scan

CLINICAL DATA: Elevated liver enzymes

EXAM:
US ABDOMEN LIMITED - RIGHT UPPER QUADRANT

[Series 1: us abdomen limited · 0.28mm/px · 14 of 29 slices shown]
[im 1/29]
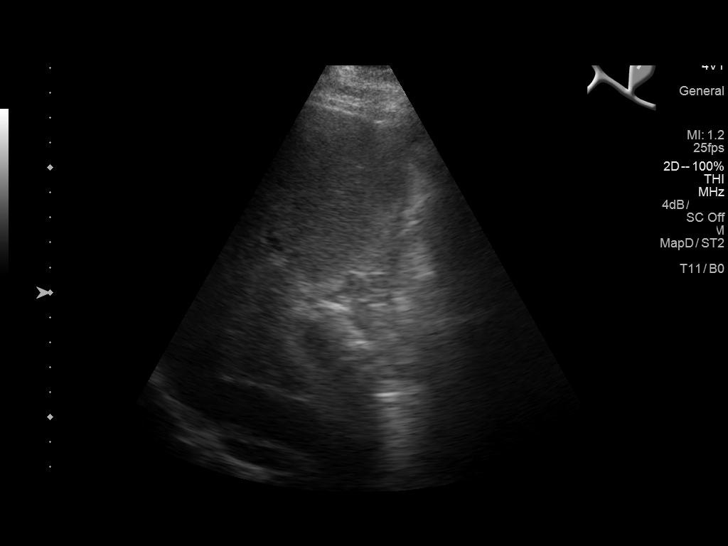
[im 3/29]
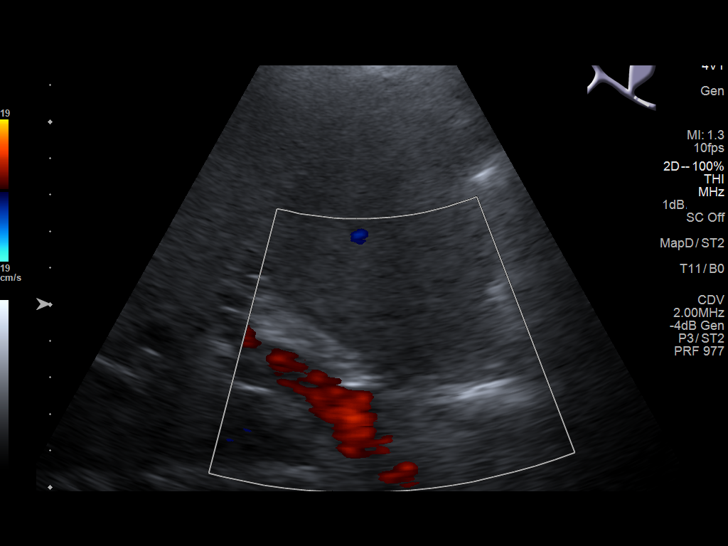
[im 5/29]
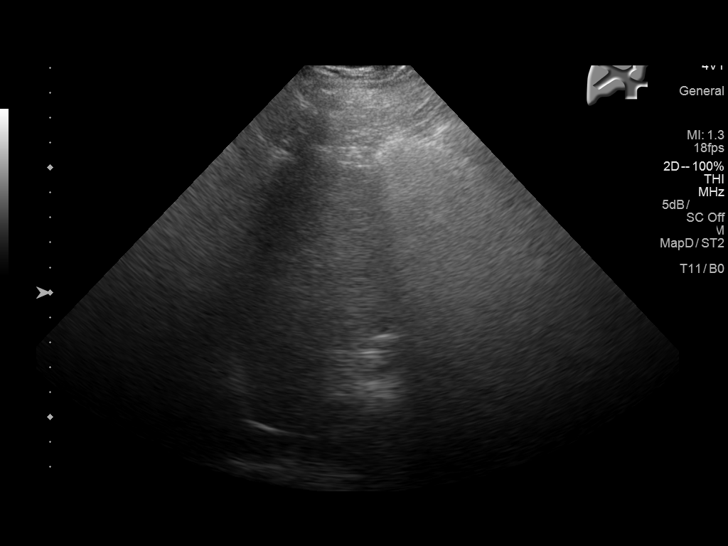
[im 8/29]
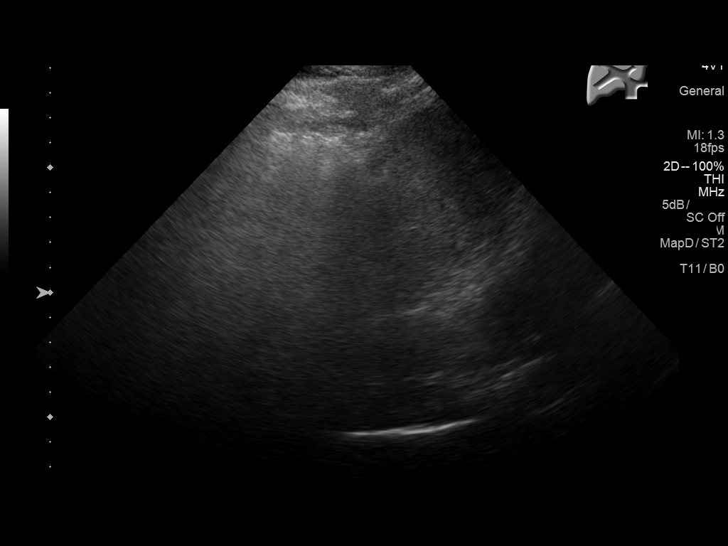
[im 10/29]
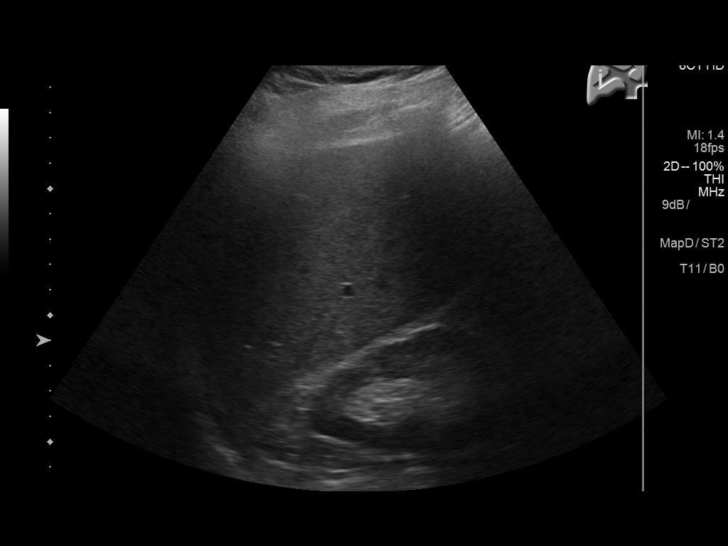
[im 11/29]
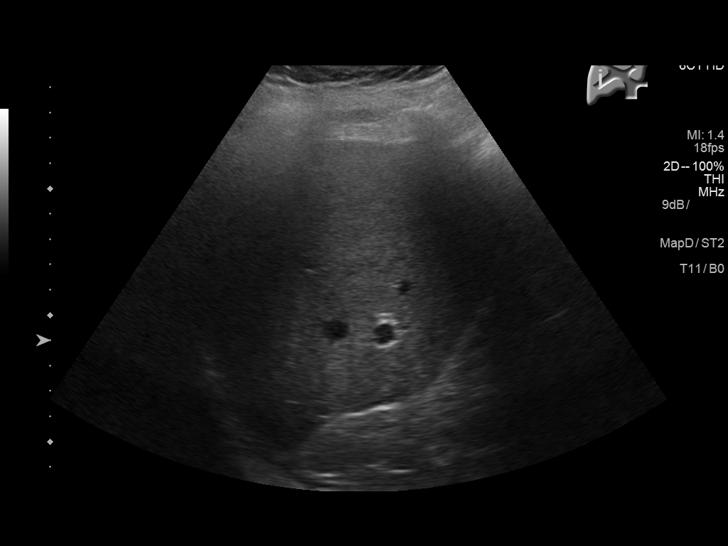
[im 13/29]
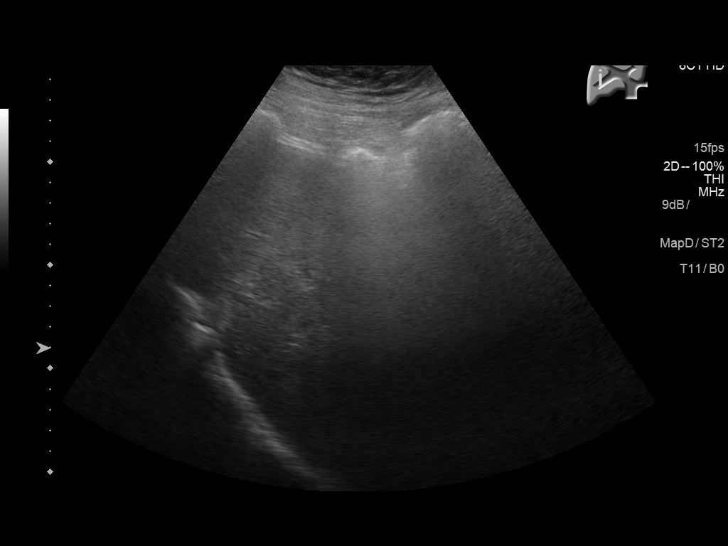
[im 16/29]
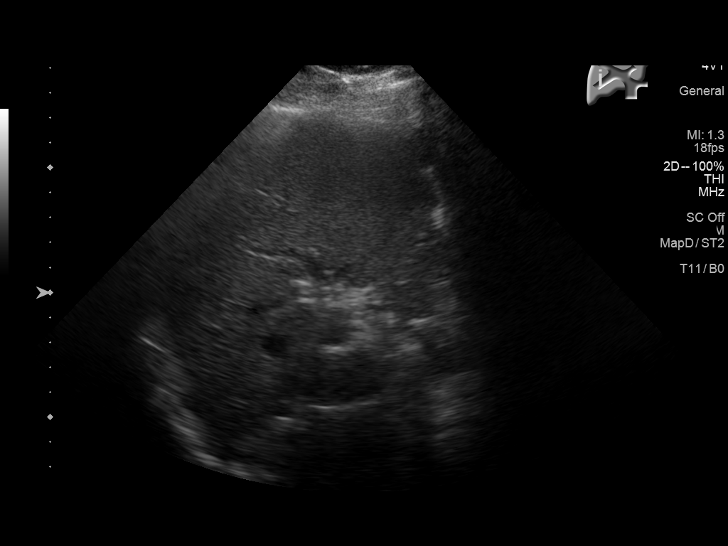
[im 18/29]
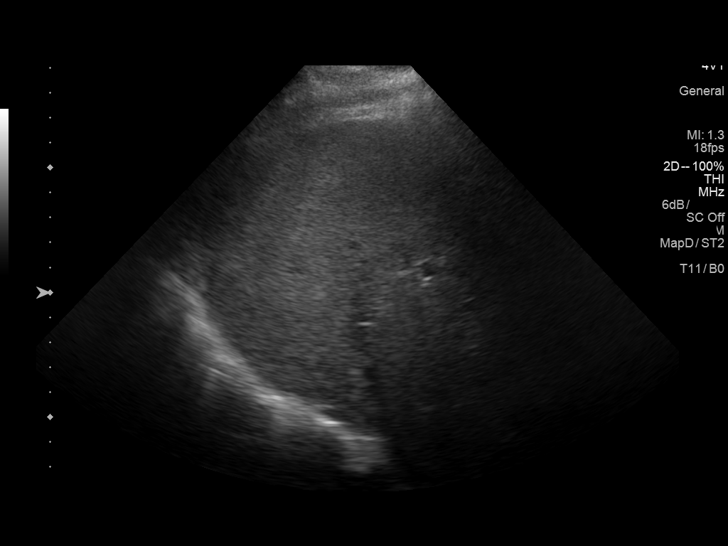
[im 19/29]
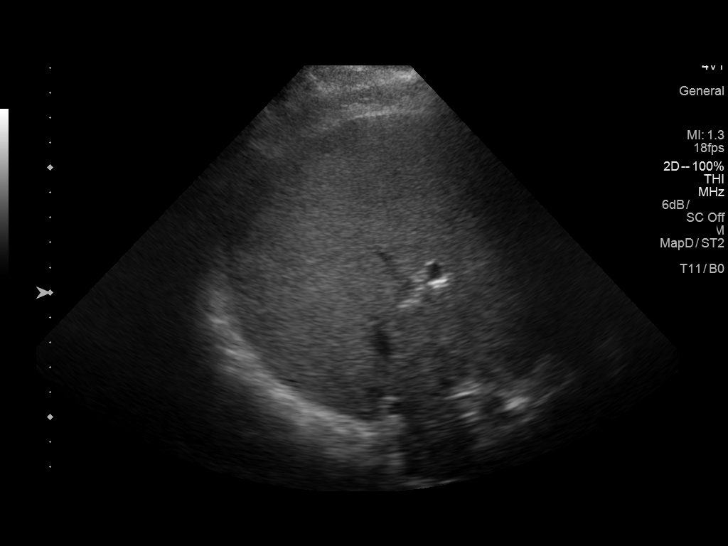
[im 22/29]
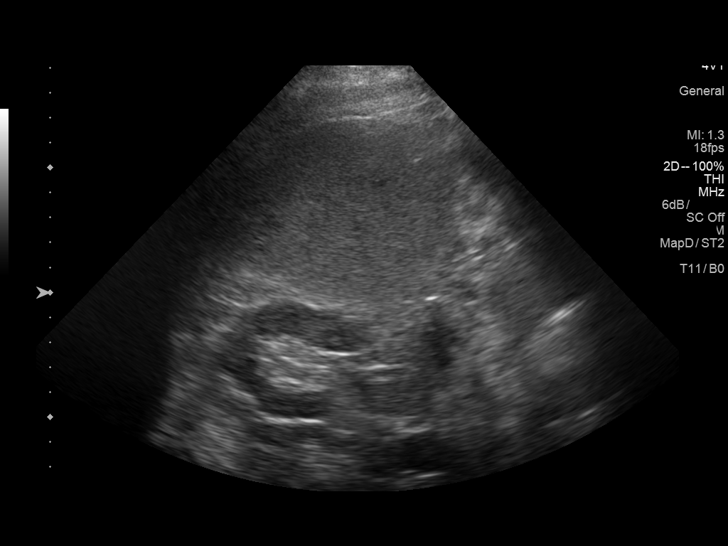
[im 24/29]
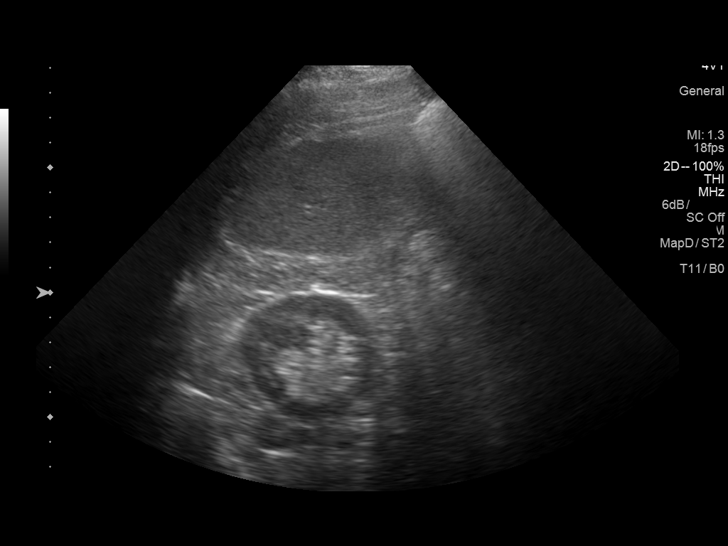
[im 26/29]
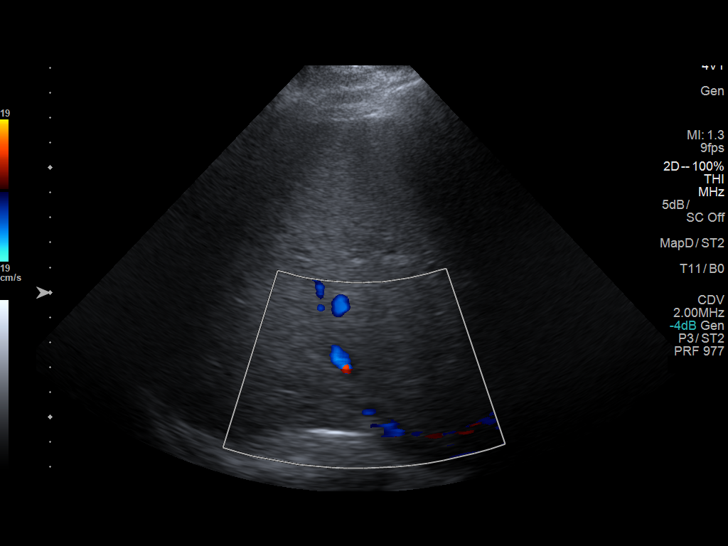
[im 29/29]
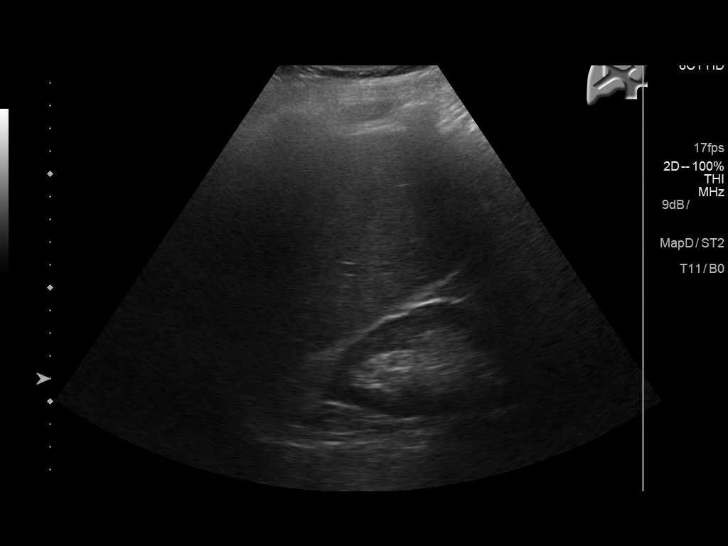

[14 of 25 positions shown; findings below may reference images not displayed]

FINDINGS: Gallbladder:

Surgically absent

Common bile duct:

Diameter: 3 mm in diameter within normal limits.

Liver:

No focal hepatic mass. Mild increased echogenicity of the liver
suspicious for fatty infiltration.
IMPRESSION: 1. Surgically absent gallbladder. Normal CBD. Mild increased
echogenicity of the liver suspicious for fatty infiltration.
